# Patient Record
Sex: Male | Born: 1955 | Race: White | Hispanic: No | Marital: Married | Smoking: Former smoker
Health system: Southern US, Community
[De-identification: ages and names within clinical notes are randomized; demographics above are authoritative.]

## PROBLEM LIST (undated history)

## (undated) DIAGNOSIS — R109 Unspecified abdominal pain: Secondary | ICD-10-CM

## (undated) DIAGNOSIS — M1612 Unilateral primary osteoarthritis, left hip: Secondary | ICD-10-CM

## (undated) DIAGNOSIS — R10A2 Flank pain, left side: Secondary | ICD-10-CM

## (undated) DIAGNOSIS — N183 Chronic kidney disease, stage 3 unspecified: Secondary | ICD-10-CM

## (undated) DIAGNOSIS — Z973 Presence of spectacles and contact lenses: Secondary | ICD-10-CM

## (undated) DIAGNOSIS — Z972 Presence of dental prosthetic device (complete) (partial): Secondary | ICD-10-CM

## (undated) DIAGNOSIS — K219 Gastro-esophageal reflux disease without esophagitis: Secondary | ICD-10-CM

## (undated) DIAGNOSIS — E782 Mixed hyperlipidemia: Secondary | ICD-10-CM

## (undated) DIAGNOSIS — E119 Type 2 diabetes mellitus without complications: Secondary | ICD-10-CM

## (undated) DIAGNOSIS — M1990 Unspecified osteoarthritis, unspecified site: Secondary | ICD-10-CM

## (undated) DIAGNOSIS — I10 Essential (primary) hypertension: Secondary | ICD-10-CM

## (undated) DIAGNOSIS — Z87442 Personal history of urinary calculi: Secondary | ICD-10-CM

## (undated) HISTORY — PX: REPLACEMENT TOTAL KNEE: SUR1224

## (undated) HISTORY — PX: OTHER SURGICAL HISTORY: SHX169

## (undated) SURGERY — Surgical Case
Anesthesia: *Unknown

---

## 2002-01-14 ENCOUNTER — Ambulatory Visit (HOSPITAL_COMMUNITY): Admission: RE | Admit: 2002-01-14 | Discharge: 2002-01-14 | Payer: Self-pay | Admitting: *Deleted

## 2003-08-03 ENCOUNTER — Ambulatory Visit (HOSPITAL_BASED_OUTPATIENT_CLINIC_OR_DEPARTMENT_OTHER): Admission: RE | Admit: 2003-08-03 | Discharge: 2003-08-03 | Payer: Self-pay | Admitting: Orthopedic Surgery

## 2003-08-03 HISTORY — PX: ROTATOR CUFF REPAIR W/ DISTAL CLAVICLE EXCISION: SHX2365

## 2003-08-31 ENCOUNTER — Emergency Department (HOSPITAL_COMMUNITY): Admission: EM | Admit: 2003-08-31 | Discharge: 2003-08-31 | Payer: Self-pay | Admitting: Emergency Medicine

## 2004-01-25 ENCOUNTER — Emergency Department (HOSPITAL_COMMUNITY): Admission: EM | Admit: 2004-01-25 | Discharge: 2004-01-25 | Payer: Self-pay | Admitting: Emergency Medicine

## 2004-06-21 ENCOUNTER — Emergency Department (HOSPITAL_COMMUNITY): Admission: EM | Admit: 2004-06-21 | Discharge: 2004-06-22 | Payer: Self-pay | Admitting: *Deleted

## 2005-01-20 ENCOUNTER — Observation Stay (HOSPITAL_COMMUNITY): Admission: EM | Admit: 2005-01-20 | Discharge: 2005-01-21 | Payer: Self-pay | Admitting: Emergency Medicine

## 2006-01-31 ENCOUNTER — Emergency Department (HOSPITAL_COMMUNITY): Admission: EM | Admit: 2006-01-31 | Discharge: 2006-01-31 | Payer: Self-pay | Admitting: Emergency Medicine

## 2007-09-13 ENCOUNTER — Ambulatory Visit (HOSPITAL_COMMUNITY): Admission: RE | Admit: 2007-09-13 | Discharge: 2007-09-13 | Payer: Self-pay | Admitting: Chiropractic Medicine

## 2008-04-24 ENCOUNTER — Encounter: Admission: RE | Admit: 2008-04-24 | Discharge: 2008-06-20 | Payer: Self-pay | Admitting: Internal Medicine

## 2008-09-01 ENCOUNTER — Encounter: Admission: RE | Admit: 2008-09-01 | Discharge: 2008-09-01 | Payer: Self-pay | Admitting: Internal Medicine

## 2008-09-18 ENCOUNTER — Encounter: Admission: RE | Admit: 2008-09-18 | Discharge: 2008-09-18 | Payer: Self-pay

## 2008-10-06 ENCOUNTER — Encounter: Admission: RE | Admit: 2008-10-06 | Discharge: 2008-10-06 | Payer: Self-pay | Admitting: Internal Medicine

## 2009-05-25 ENCOUNTER — Encounter: Admission: RE | Admit: 2009-05-25 | Discharge: 2009-05-25 | Payer: Self-pay | Admitting: Internal Medicine

## 2009-08-27 ENCOUNTER — Encounter: Admission: RE | Admit: 2009-08-27 | Discharge: 2009-08-27 | Payer: Self-pay | Admitting: Internal Medicine

## 2010-03-29 ENCOUNTER — Encounter: Admission: RE | Admit: 2010-03-29 | Discharge: 2010-03-29 | Payer: Self-pay | Admitting: Internal Medicine

## 2010-04-11 ENCOUNTER — Encounter: Admission: RE | Admit: 2010-04-11 | Discharge: 2010-04-11 | Payer: Self-pay | Admitting: Internal Medicine

## 2010-05-13 ENCOUNTER — Encounter: Admission: RE | Admit: 2010-05-13 | Discharge: 2010-05-13 | Payer: Self-pay | Admitting: Internal Medicine

## 2010-05-28 ENCOUNTER — Encounter: Admission: RE | Admit: 2010-05-28 | Discharge: 2010-05-28 | Payer: Self-pay | Admitting: Orthopedic Surgery

## 2010-08-27 ENCOUNTER — Encounter
Admission: RE | Admit: 2010-08-27 | Discharge: 2010-08-27 | Payer: Self-pay | Admitting: Physical Medicine and Rehabilitation

## 2010-12-29 ENCOUNTER — Encounter: Payer: Self-pay | Admitting: Internal Medicine

## 2010-12-30 ENCOUNTER — Encounter: Payer: Self-pay | Admitting: Internal Medicine

## 2011-04-25 NOTE — H&P (Signed)
NAME:  Ralph Marquez, Ralph Marquez               ACCOUNT NO.:  [id number]   MEDICAL RECORD NO.:  [id number]          PATIENT TYPE:  EMS   LOCATION:  MAJO                         FACILITY:  [facility]   PHYSICIAN:  Elliot Cousin, M.D.    DATE OF BIRTH:  24-Jul-1956   DATE OF ADMISSION:  01/20/2005  DATE OF DISCHARGE:                                HISTORY & PHYSICAL   CHIEF COMPLAINT:  Chest pain.   HISTORY OF PRESENT ILLNESS:  The patient is a 55 year old man with a past  medical history significant for tobacco use, hemorrhoids, and rotator cuff  repair, who presents to the hospital with a chief complaint of chest pain.  The patient's chest pain started three days ago.  It occurred initially  while he was walking.  The chest pain is described as a twisting, dull pain.  It is located in the substernal area.  It occurred at rest and with  activity.  It lasts for a few seconds and then it resolved spontaneously.  He again had chest pain several times daily over the last three days.  He  states that the chest pain is lasting longer, seconds to minutes, and it  appears to be more constant.  At its worse, the chest pain is rated as a  5/10.  There is no associated diaphoresis, nausea, radiation, or pleurisy.  No light headedness.  However, the patient stated that he became slightly  disoriented today at work which prompted him to come to the emergency  department.  The patient denies being under more stress than usual.  He  denies anxiety.  He does manual labor as a Therapist, occupational.  He  does not lift a lot, however, he tugs and pulls on machinery all day long.  He has had no prior history of chest pain.  He has had no recent cough or  upper respiratory infection symptoms.  He has had no swollen legs.  He is  currently chest pain free now after being given one sublingual  nitroglycerin.  The patient does not recall having any recent heartburn or  indigestion.   PAST MEDICAL HISTORY:  1.   History of rectal bleeding in 2004 and 2005; colonoscopy performed in      either 2004 or 2005 by Dr. Tad Moore revealed hemorrhoids.  2.  Status post debridement of a rotator cuff tear and subacromial      decompensation August 2004 by Dr. Lajoyce Corners.  3.  Tobacco use.   MEDICATIONS:  None.   ALLERGIES:  No known drug allergies.   FAMILY HISTORY:  The patient's mother died at 39 years of age secondary to  kidney failure.  She had a history of heart disease, myocardial infarction,  COPD, and diabetes.  His father is deceased, age and etiology of death  unknown.  He has several siblings, none of which have heart disease.   SOCIAL HISTORY:  The patient is married.  He lives in [place], [place], with his wife.  He has three children.  He has seven  grandchildren.  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  He smokes one cigar per day.  He denies drug and alcohol use.   REVIEW OF SYMPTOMS:  The patient's review of systems is, otherwise,  negative.   PHYSICAL EXAMINATION:  VITAL SIGNS:  Temperature 97.3, blood pressure 154/86, repeated at 101/64,  pulse 91, repeated at 71, respiratory rate 20, oxygen saturation 96% on room  air.  GENERAL:  The patient is a pleasant, average built 55 year old Caucasian man  who is currently sitting up in bed in no acute distress.  HEENT:  Head is normocephalic, atraumatic.  Pupils equal, round, reactive to  light.  Extraocular movements intact.  Conjunctivae clear.  Sclerae white.  Tympanic membranes are clear bilaterally.  Nasal mucosa is moist, no  drainage, no sinus tenderness.  Oropharynx reveals upper dentures and native  teeth at the bottom.  No posterior exudates or erythema.  Mucous membranes  are mildly dry.  NECK:  Supple, no adenopathy, no thyromegaly, no JVD, no bruits.  LUNGS:  Clear to auscultation bilaterally.  HEART:  S1 and S2 without murmurs, gallops, and rubs.  ABDOMEN:  Positive bowel sounds, soft, mild  epigastric tenderness.  No  distention, no rebound, no guarding, no hepatosplenomegaly, no masses  palpated.  RECTAL AND GU:  Deferred.  EXTREMITIES:  Pedal pulses 2+ bilaterally.  No pedal edema.  No pretibial  edema.  No calf tenderness.  The patient had good range of motion of al his  joints.  No acute joint abnormality.  Gait is within normal limits.  NEUROLOGICAL:  The patient is alert and oriented x 3.  Cranial nerves 2-12  are intact.  Strength 5/5 throughout.  Sensation is intact throughout.   ADMISSION LABORATORY DATA:  Chest x-ray revealed no active disease.  EKG  showed normal sinus rhythm with a heart rate of 82 beats per minute, no  acute abnormalities.  Sodium 141, potassium 3.8, chloride 107, BUN 15,  glucose 99, bicarbonate 23, creatinine 1, myoglobin 68.1.  CK MB 1.1,  troponin I less than 0.05.   ASSESSMENT:  Chest pain.  Given the patient's risk factors including tobacco  abuse and  positive family history, he will be admitted to rule out  myocardial infarction.  The patient is currently chest pain free following  one sublingual nitroglycerin.  He does have a normal chest x-ray and a  normal EKG.   PLAN:  1.  The patient will be admitted for 24 hour observation to rule out      myocardial infarction.  2.  He will be admitted to a telemetry bed.  Cardiac enzymes q.8h. x 3 will      be ordered.  He will have further assessment via a TSH and fasting lipid      panel in the morning.  We will also order a complete CBC and CMP.  3.  Will treat the patient's pain with sublingual nitroglycerin as needed,      Oxycodone, and Tylenol.  4.  Will start Maalox 15 mL b.i.d. and Protonix 40 mg daily empirically.  5.  Will check EKG for chest pain x 2 and assess another EKG in the a.m.  6.  If the patient's EKG and/or cardiac enzymes become abnormal, will      consult cardiology.  7.  Will start an aspirin at 81 mg daily for now.     DF/MEDQ  D:  01/20/2005  T:  01/20/2005   Job:  [id number]   cc:   Juline Patch, M.D.  [address]  [place], [place] [address]  Fax: [phone]

## 2011-04-25 NOTE — Op Note (Signed)
NAME:  [name], Ralph                         ACCOUNT NO.:  [id number]   MEDICAL RECORD NO.:  [id number]                   PATIENT TYPE:  AMB   LOCATION:  DSC                                  FACILITY:  [facility]   PHYSICIAN:  Nadara Mustard, M.D.                DATE OF BIRTH:  07/21/1956   DATE OF PROCEDURE:  08/03/2003  DATE OF DISCHARGE:                                 OPERATIVE REPORT   PREOPERATIVE DIAGNOSES:  1. Rotator cuff tear.  2. Impingement syndrome.   POSTOPERATIVE DIAGNOSES:  1. Rotator cuff tear.  2. Grade 1 superior labrum anterior and posterior lesion.  3. Acromioclavicular joint arthropathy.  4. Impingement syndrome.   PROCEDURES:  1. Debridement of rotator cuff tear and superior labrum anterior and     posterior lesion.  2. Subacromial decompression.  3. Distal clavicle resection.   SURGEON:  Nadara Mustard, M.D.   ANESTHESIA:  General.   ESTIMATED BLOOD LOSS:  Minimal.   DISPOSITION:  To PACU in stable condition, discharged to home.  Plan to  begin range of motion in one day, follow up in the office in two weeks.   INDICATION FOR PROCEDURE:  The patient is a 55 year old gentleman with  chronic right shoulder pain.  The patient has failed conservative care, and  MRI scan confirms a rotator cuff tear and he presents at this time for  arthroscopic intervention.  The risks and benefits were discussed, including  infection, neurovascular injury, persistent pain, need for additional  surgery.  The patient states he understands and wishes to proceed at this  time.   DESCRIPTION OF PROCEDURE:  The patient was brought to OR room 5 and  underwent a general anesthetic.  After an adequate level of anesthesia  obtained, the patient's right upper extremity was prepped using Duraprep and  draped into a sterile field with the patient in the beach chair position.  The scope was inserted from the posterior portal and the anterior portal was  established from the  outside-in technique with an 18-gauge spinal.  Visualization showed a large grade 1 SLAP lesion as well as a large partial-  thickness tear of the rotator cuff just above the glenoid.  The rotator cuff  tear was debrided, the SLAP lesion was debrided, and the VAPR Mitek was used  for hemostasis.  The remainder of the biceps tendon was intact.  There was  no osteochondral lesion of the glenoid or the humeral head.  There were no  adhesions of the subscapularis.  The scope was then inserted from the  posterior portal into the subacromial space and a lateral portal was  established.  A subacromial decompression was performed.  The patient has a  large hooked type 3 acromion.  The VAPR Mitek was used for hemostasis.  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  Hemostasis was  obtained with the VAPR Mitek.  The instruments were removed.  The portals  were injected with a total of 20 mL of 0.5% Marcaine plain and 4 mg of  morphine.  The wounds were covered with Adaptic, orthopedic sponges, ABD  dressing, and Ioban dressing was applied.  The patient was extubated, taken  to the PACU in stable condition, discharged to home to follow up in the  office in two weeks, begin range of motion in one day.                                               Nadara Mustard, M.D.    MVD/MEDQ  D:  08/03/2003  T:  08/04/2003  Job:  [id number]

## 2013-05-09 ENCOUNTER — Other Ambulatory Visit: Payer: Self-pay | Admitting: Internal Medicine

## 2013-05-09 DIAGNOSIS — R7401 Elevation of levels of liver transaminase levels: Secondary | ICD-10-CM

## 2014-02-28 ENCOUNTER — Ambulatory Visit
Admission: RE | Admit: 2014-02-28 | Discharge: 2014-02-28 | Disposition: A | Discharge status: Home / Self Care | Payer: BC Managed Care – PPO | Source: Ambulatory Visit | Attending: Orthopedic Surgery | Admitting: Orthopedic Surgery

## 2014-02-28 ENCOUNTER — Other Ambulatory Visit: Payer: Self-pay | Admitting: Orthopedic Surgery

## 2014-02-28 DIAGNOSIS — M25561 Pain in right knee: Secondary | ICD-10-CM

## 2014-12-08 HISTORY — PX: TOTAL KNEE ARTHROPLASTY: SHX125

## 2015-02-14 ENCOUNTER — Ambulatory Visit (HOSPITAL_COMMUNITY): Payer: BLUE CROSS/BLUE SHIELD | Attending: Sports Medicine | Admitting: Physical Therapy

## 2015-02-14 DIAGNOSIS — M25661 Stiffness of right knee, not elsewhere classified: Secondary | ICD-10-CM | POA: Diagnosis not present

## 2015-02-14 DIAGNOSIS — Z96651 Presence of right artificial knee joint: Secondary | ICD-10-CM

## 2015-02-14 DIAGNOSIS — R29898 Other symptoms and signs involving the musculoskeletal system: Secondary | ICD-10-CM

## 2015-02-14 DIAGNOSIS — M6281 Muscle weakness (generalized): Secondary | ICD-10-CM | POA: Insufficient documentation

## 2015-02-14 DIAGNOSIS — Z471 Aftercare following joint replacement surgery: Secondary | ICD-10-CM | POA: Diagnosis not present

## 2015-02-14 DIAGNOSIS — R262 Difficulty in walking, not elsewhere classified: Secondary | ICD-10-CM

## 2015-02-14 DIAGNOSIS — M25561 Pain in right knee: Secondary | ICD-10-CM | POA: Insufficient documentation

## 2015-02-14 NOTE — Therapy (Signed)
[facility] [address] [place], [place], [address] Phone: [phone]   Fax:  [phone]  Physical Therapy Evaluation  Patient Details  Name: Ralph Marquez MRN: [id number] Date of Birth: [birth year] Referring Provider:  Lenis Dickinson., MD  Encounter Date: 02/14/2015      PT End of Session - 02/14/15 1759    Visit Number 1   Number of Visits 16   Date for PT Re-Evaluation 03/16/15   Authorization Type BCBS   Authorization - Visit Number 1   Authorization - Number of Visits 16   PT Start Time 1700   PT Stop Time 1754   PT Time Calculation (min) 54 min   Activity Tolerance Patient tolerated treatment well   Behavior During Therapy [facility] for tasks assessed/performed      No past medical history on file.  No past surgical history on file.  There were no vitals taken for this visit.  Visit Diagnosis:  Knee stiffness, right - Plan: PT plan of care cert/re-cert  Difficulty walking - Plan: PT plan of care cert/re-cert  Right leg weakness - Plan: PT plan of care cert/re-cert  Status post total right knee replacement - Plan: PT plan of care cert/re-cert      Subjective Assessment - 02/14/15 1727    Symptoms Patyinet notes some numbness and tingling and weakness in Rt hand s/p right TKA due to over compensation with weight bearing through Rt UE.    Pertinent History histor of Lt knee meniscus tear that uimproved following surgery. Rt knee Replacement due to failed meniscus repair on it. Patient had surgery 01/23/15. 2 weeks of HHPt which resulted in a good HEP and improving strength. Patient ambulates wiht a cain everywhere except for very short distances in home. Initial injury occureded durign a fall due to tripping in a hole.    Limitations Walking   How long can you walk comfortably?   Patient Stated Goals to be able to get up off the floor, to be bale to walk wihout a cain, and to be able to hike. 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 Yes   Pain Score 2    Pain Location Knee   Pain Orientation Right   Pain Descriptors / Indicators Dull   Pain Type Surgical pain   Pain Onset 1 to 4 weeks ago   Pain Frequency Intermittent   Aggravating Factors  pain and weight bearing.   Pain Relieving Factors pain medication, elevation, stretching.          [facility] PT Assessment - 02/14/15 0001    Assessment   Medical Diagnosis Rt TKA   Onset Date 01/23/15   Next MD Visit [name], 03/11/15   Balance Screen   Has the patient fallen in the past 6 months No   Has the patient had a decrease in activity level because of a fear of falling?  No   Prior Function   Level of Independence Independent with basic ADLs   Functional Tests   Functional tests Other2;Other   Other:   Other/ Comments ait: limited hip internal rotation, excessive toe out, limited weight bearing on Rt.    ROM / Strength   AROM / PROM / Strength AROM;Strength   AROM   AROM Assessment Site Hip;Knee;Ankle   Right/Left Hip Right;Left   Right Hip External Rotation  45   Right Hip Internal Rotation  35  Left Hip External Rotation  45   Left Hip Internal Rotation  34   Right/Left Knee Right;Left   Right Knee Extension -5   Right Knee Flexion 94   Left Knee Extension 0   Left Knee Flexion 130   Strength   Strength Assessment Site Hip;Knee;Ankle   Right/Left Hip Right;Left   Right Hip Flexion 5/5   Left Hip Flexion 5/5   Right/Left Knee Right;Left   Right Knee Flexion 4/5   Right Knee Extension 4-/5   Left Knee Flexion 5/5   Left Knee Extension 5/5   Right/Left Ankle Right;Left                  [facility] Adult PT Treatment/Exercise - 02/14/15 0001    Knee/Hip Exercises: Stretches   Active Hamstring Stretch 3 reps;20 seconds   Active Hamstring Stretch Limitations 20 seconds   Quad Stretch Limitations 3x 20 seconds with rope   Hip Flexor Stretch Limitations 3x 20 seconds 3 wayto 6" box   Gastroc  Stretch Limitations 3x 20seconds on floor.                  PT Short Term Goals - 02/14/15 1804    PT SHORT TERM GOAL #1   Title Patient will dmeonstrate full knee extension to 0 degrees t be able to fully extend knee to normalize gait mechanics.    Time 4   Period Weeks   Status New   PT SHORT TERM GOAL #2   Title Patient will be able to flex knee to 110 degrees to squat to a chair   Time 4   Period Weeks   Status New   PT SHORT TERM GOAL #3   Title Patient will be able to walk withtou a cain for without fear of falling.    Time 4   Period Weeks   Status New   PT SHORT TERM GOAL #4   Title Patiwent will be independent with HEP.   Time 4   Period Weeks   Status New           PT Long Term Goals - 02/14/15 1806    PT LONG TERM GOAL #1   Title Patient will be able to flex knee to 120 degrees to squat to the floor   Time 8   Period Weeks   Status New   PT LONG TERM GOAL #2   Title Patient will be able to walk without a cain for wit pain <2/10 on average.    Time 8   Period Weeks   Status New   PT LONG TERM GOAL #3   Title Patient will demosntrate 5/5/ Rt knee flexion/extension strength with ability to ambulate up and down stairs withtou UE support.    Time 8   Period Weeks   Status New               Plan - 02/14/15 1800    Clinical Impression Statement Patient displays Rt knee stiffness and pain secondary to weakness s/p Rt TKA resulting in difficutly walking and patient una ble to return to work. Patient will benefit from skileld phsyical therapy toincrease Rt knee strength and ROM so patient can walk without an assistive device, crawl on hands and knees to return to work, knee on the ground and be able to hike to keep up with grand daughters.    Pt will benefit from skilled therapeutic intervention in order to improve on the following  deficits Decreased strength;Pain;Abnormal gait;Difficulty walking;Decreased activity  tolerance;Impaired flexibility;Decreased endurance;Improper body mechanics;Decreased range of motion   Rehab Potential Good   PT Frequency 2x / week   PT Duration 8 weeks   PT Treatment/Interventions Therapeutic exercise;Patient/family education;Gait training;Manual techniques;Stair training   PT Next Visit Plan Review LE strretches, introduce 3D hip excursions, 3 way knee driver and squat walk arounds for increasing hip and knee mobility    PT Home Exercise Plan hamstring, hip flexor, quadriceps, and calf stretches. 20xeconds, multi directional.    Consulted and Agree with Plan of Care Patient         Problem List There are no active problems to display for this patient.  Jerilee FieldCash Itzel Mckibbin PT DPT [phone]  [facility] [address] [place], [place], [address] Phone: [phone]   Fax:  [phone]

## 2015-02-15 ENCOUNTER — Ambulatory Visit (HOSPITAL_COMMUNITY): Payer: BLUE CROSS/BLUE SHIELD | Admitting: Physical Therapy

## 2015-02-15 DIAGNOSIS — Z471 Aftercare following joint replacement surgery: Secondary | ICD-10-CM | POA: Diagnosis not present

## 2015-02-15 DIAGNOSIS — Z96651 Presence of right artificial knee joint: Secondary | ICD-10-CM

## 2015-02-15 DIAGNOSIS — M25661 Stiffness of right knee, not elsewhere classified: Secondary | ICD-10-CM

## 2015-02-15 DIAGNOSIS — R29898 Other symptoms and signs involving the musculoskeletal system: Secondary | ICD-10-CM

## 2015-02-15 DIAGNOSIS — R262 Difficulty in walking, not elsewhere classified: Secondary | ICD-10-CM

## 2015-02-15 NOTE — Therapy (Signed)
[facility] [address] [place], [place], [address] Phone: [phone]   Fax:  [phone]  Physical Therapy Treatment  Patient Details  Name: Ralph Marquez MRN: [id number] Date of Birth: 10/04/1956 Referring Provider:  Lenis DickinsonWeller, Edward B., MD  Encounter Date: 02/15/2015      PT End of Session - 02/15/15 1600    Visit Number 2   Number of Visits 16   Date for PT Re-Evaluation 03/16/15   Authorization Type BCBS   Authorization - Visit Number 2   Authorization - Number of Visits 16   PT Start Time 1515   PT Stop Time 1557   PT Time Calculation (min) 42 min   Activity Tolerance Patient tolerated treatment well   Behavior During Therapy [facility] for tasks assessed/performed      No past medical history on file.  No past surgical history on file.  There were no vitals filed for this visit.  Visit Diagnosis:  Knee stiffness, right  Difficulty walking  Right leg weakness  Status post total right knee replacement      Subjective Assessment - 02/15/15 1535    Symptoms Patient states that he is having 2/10 pain; just doing "alright" today. States that he feels like he'll start to do better once more of the swelling gets out of his leg.    Pertinent History histor of Lt knee meniscus tear that uimproved following surgery. Rt knee Replacement due to failed meniscus repair on it. Patient had surgery 01/23/15. 2 weeks of HHPt which resulted in a good HEP and improving strength. Patient ambulates wiht a cain everywhere except for very short distances in home. Initial injury occureded durign a fall due to tripping in a hole.                        [facility] Adult PT Treatment/Exercise - 02/15/15 0001    Knee/Hip Exercises: Stretches   Active Hamstring Stretch 3 reps;30 seconds   Active Hamstring Stretch Limitations 14 inch box   Quad Stretch 2 reps;30 seconds   Quad Stretch Limitations prone with rope   Piriformis Stretch 2  reps;30 seconds   Piriformis Stretch Limitations supine   Gastroc Stretch 3 reps;30 seconds   Gastroc Stretch Limitations slantboard   Knee/Hip Exercises: Standing   Forward Lunges 1 set;Both;10 reps   Forward Lunges Limitations 6 inch box, forward knee drives   Functional Squat 1 set;10 reps   Functional Squat Limitations sit to stand from standard chair   Rocker Board 4 minutes   Rocker Board Limitations 2 minutes anterior/posterior, 2 minutes lateral   Gait Training Stair training on 7" steps x4 steps four times with cane, U railing; step over step pattern with mild circumduction R leg.    Other Standing Knee Exercises Facilitation of bilateral hip IR   Knee/Hip Exercises: Supine   Bridges Both;1 set;10 reps   Knee/Hip Exercises: Sidelying   Hip ABduction Both;1 set;10 reps   Hip ABduction Limitations manual facilitation for performance of task   Knee/Hip Exercises: Prone   Hip Extension Both;1 set;10 reps   Hip Extension Limitations manual faciliation for formj             Balance Exercises - 02/15/15 1558    Balance Exercises: Standing   Standing Eyes Closed Narrow base of support (BOS);Foam/compliant surface;3 reps;20 secs   Tandem Stance Eyes open;Foam/compliant surface;3 reps;20 secs  PT Education - 02/15/15 1559    Education provided Yes   Education Details Education regarding role of motion in reduced edema through surgical leg   Person(s) Educated Patient   Methods Explanation   Comprehension Verbalized understanding          PT Short Term Goals - 02/14/15 1804    PT SHORT TERM GOAL #1   Title Patient will dmeonstrate full knee extension to 0 degrees t be able to fully extend knee to normalize gait mechanics.    Time 4   Period Weeks   Status New   PT SHORT TERM GOAL #2   Title Patient will be able to flex knee to 110 degrees to squat to a chair   Time 4   Period Weeks   Status New   PT SHORT TERM GOAL #3   Title Patient will be able  to walk withtou a cain for without fear of falling.    Time 4   Period Weeks   Status New   PT SHORT TERM GOAL #4   Title Patiwent will be independent with HEP.   Time 4   Period Weeks   Status New           PT Long Term Goals - 02/14/15 1806    PT LONG TERM GOAL #1   Title Patient will be able to flex knee to 120 degrees to squat to the floor   Time 8   Period Weeks   Status New   PT LONG TERM GOAL #2   Title Patient will be able to walk without a cain for wit pain <2/10 on average.    Time 8   Period Weeks   Status New   PT LONG TERM GOAL #3   Title Patient will demosntrate 5/5/ Rt knee flexion/extension strength with ability to ambulate up and down stairs withtou UE support.    Time 8   Period Weeks   Status New               Plan - 02/15/15 1601    Clinical Impression Statement Patient continues to display R knee stiffness, pain, and difficulty with ambulation and functional task performance. Patient responded well to today's exercise program but did display weakness of proximal muscle groups as well as overall difficulty with mobility. Signfiicant bruising noted throughout R leg, also significant edema however patient states that this has improved significantly since his surgery.    Pt will benefit from skilled therapeutic intervention in order to improve on the following deficits Decreased strength;Pain;Abnormal gait;Difficulty walking;Decreased activity tolerance;Impaired flexibility;Decreased endurance;Improper body mechanics;Decreased range of motion   Rehab Potential Good   PT Frequency 2x / week   PT Duration 8 weeks   PT Treatment/Interventions Therapeutic exercise;Patient/family education;Gait training;Manual techniques;Stair training   PT Next Visit Plan Continue functional stretches and strengthening; address hip IR limitations; stair and gait training   Consulted and Agree with Plan of Care Patient        Problem List There  are no active problems to display for this patient.  Nedra Hai PT, DPT [phone]  [facility] [address] [place], [place], [address] Phone: [phone]   Fax:  [phone]

## 2015-02-16 ENCOUNTER — Ambulatory Visit (HOSPITAL_COMMUNITY): Payer: BLUE CROSS/BLUE SHIELD | Admitting: Physical Therapy

## 2015-02-16 DIAGNOSIS — Z96651 Presence of right artificial knee joint: Secondary | ICD-10-CM

## 2015-02-16 DIAGNOSIS — M25661 Stiffness of right knee, not elsewhere classified: Secondary | ICD-10-CM

## 2015-02-16 DIAGNOSIS — R29898 Other symptoms and signs involving the musculoskeletal system: Secondary | ICD-10-CM

## 2015-02-16 DIAGNOSIS — R262 Difficulty in walking, not elsewhere classified: Secondary | ICD-10-CM

## 2015-02-16 DIAGNOSIS — Z471 Aftercare following joint replacement surgery: Secondary | ICD-10-CM | POA: Diagnosis not present

## 2015-02-16 NOTE — Therapy (Signed)
[facility] [address] [place], [place], [address] Phone: [phone]   Fax:  [phone]  Physical Therapy Treatment  Patient Details  Name: Ralph Marquez MRN: [id number] Date of Birth: 03/08/1956 Referring Provider:  Lenis DickinsonWeller, Edward B., MD  Encounter Date: 02/16/2015      PT End of Session - 02/16/15 1313    Visit Number 3   Number of Visits 16   Date for PT Re-Evaluation 03/16/15   Authorization Type BCBS   Authorization - Visit Number 3   Authorization - Number of Visits 16   PT Start Time 1300   PT Stop Time 1345   PT Time Calculation (min) 45 min   Activity Tolerance Patient tolerated treatment well   Behavior During Therapy [facility] for tasks assessed/performed      No past medical history on file.  No past surgical history on file.  There were no vitals filed for this visit.  Visit Diagnosis:  Knee stiffness, right  Difficulty walking  Right leg weakness  Status post total right knee replacement      Subjective Assessment - 02/16/15 1303    Symptoms Patient is in a good mood notes mild soreness and that his knee is overall feeing good.    Currently in Pain? No/denies   Pain Score 2    Pain Location Knee            [facility] - 02/16/15 0001    Assessment   Next MD Visit Loistine ChanceEdward Weller, 03/01/15                   [facility] Adult PT Treatment/Exercise - 02/16/15 0001    Knee/Hip Exercises: Stretches   Active Hamstring Stretch 3 reps;30 seconds   Active Hamstring Stretch Limitations 8 inch box   Quad Stretch 2 reps;30 seconds   Quad Stretch Limitations prone with rope   Hip Flexor Stretch Limitations 3x 20 seconds 3 wayto 6" box 10x 3 seconds 3 way    Piriformis Stretch 2 reps;30 seconds   Piriformis Stretch Limitations supine   Gastroc Stretch 3 reps;30 seconds   Gastroc Stretch Limitations slantboard   Knee/Hip Exercises: Standing   Forward Lunges 5 reps   Forward Lunges Limitations  floor static   Side Lunges 5 reps   Side Lunges Limitations floor static   Functional Squat Limitations squa walk around 10x to 6" box   Rocker Board 4 minutes   Rocker Board Limitations 2 minutes anterior/posterior, 2 minutes lateral   Other Standing Knee Exercises 3D hip excursion 10x    Other Standing Knee Exercises anterior lateral lunges to 6" 10x with opposite side rotation             Balance Exercises - 02/15/15 1558    Balance Exercises: Standing   Standing Eyes Closed Narrow base of support (BOS);Foam/compliant surface;3 reps;20 secs   Tandem Stance Eyes open;Foam/compliant surface;3 reps;20 secs           PT Education - 02/15/15 1559    Education provided Yes   Education Details Education regarding role of motion in reduced edema through surgical leg   Person(s) Educated Patient   Methods Explanation   Comprehension Verbalized understanding          PT Short Term Goals - 02/14/15 1804    PT SHORT TERM GOAL #1   Title Patient will dmeonstrate full knee extension to 0 degrees t be able to fully extend knee to normalize  gait mechanics.    Time 4   Period Weeks   Status New   PT SHORT TERM GOAL #2   Title Patient will be able to flex knee to 110 degrees to squat to a chair   Time 4   Period Weeks   Status New   PT SHORT TERM GOAL #3   Title Patient will be able to walk withtou a cain for without fear of falling.    Time 4   Period Weeks   Status New   PT SHORT TERM GOAL #4   Title Patiwent will be independent with HEP.   Time 4   Period Weeks   Status New           PT Long Term Goals - 02/14/15 1806    PT LONG TERM GOAL #1   Title Patient will be able to flex knee to 120 degrees to squat to the floor   Time 8   Period Weeks   Status New   PT LONG TERM GOAL #2   Title Patient will be able to walk without a cain for wit pain <2/10 on average.    Time 8   Period Weeks   Status New   PT LONG TERM GOAL #3   Title  Patient will demosntrate 5/5/ Rt knee flexion/extension strength with ability to ambulate up and down stairs withtou UE support.    Time 8   Period Weeks   Status New               Plan - 02/16/15 1313    Clinical Impression Statement Patient displays improving knee and hip ROM resulting in improving gait with decreased reliance of cain. Patient displasy improvign activity toelrance as well. Stretches performed this session to good effect. Patient displayed difficulty maintaining Streight knee durign 14" box hamstring stretch which was then regressed to 8" with good form.  paion with hooklaying pirifomirs stretch whcih was withheld.    PT Next Visit Plan Continue functional stretches and strengthening; address hip IR limitations; perform gait activities to normalize stride.         Problem List There are no active problems to display for this patient.   Doyne Keel 02/16/2015, 1:41 PM  [facility] [address] [place], [place], [address] Phone: [phone]   Fax:  [phone]

## 2015-02-19 ENCOUNTER — Ambulatory Visit (HOSPITAL_COMMUNITY): Payer: BLUE CROSS/BLUE SHIELD | Admitting: Physical Therapy

## 2015-02-19 DIAGNOSIS — Z471 Aftercare following joint replacement surgery: Secondary | ICD-10-CM | POA: Diagnosis not present

## 2015-02-19 DIAGNOSIS — M25661 Stiffness of right knee, not elsewhere classified: Secondary | ICD-10-CM

## 2015-02-19 DIAGNOSIS — Z96651 Presence of right artificial knee joint: Secondary | ICD-10-CM

## 2015-02-19 DIAGNOSIS — R262 Difficulty in walking, not elsewhere classified: Secondary | ICD-10-CM

## 2015-02-19 DIAGNOSIS — R29898 Other symptoms and signs involving the musculoskeletal system: Secondary | ICD-10-CM

## 2015-02-19 NOTE — Therapy (Signed)
[facility] [address] [place], [place], [address] Phone: [phone]   Fax:  [phone]  Physical Therapy Treatment  Patient Details  Name: Ralph Marquez MRN: [id number] Date of Birth: 10/10/1956 Referring Provider:  Lenis DickinsonWeller, Edward B., MD  Encounter Date: 02/19/2015      PT End of Session - 02/19/15 1154    Visit Number 4   Number of Visits 16   Date for PT Re-Evaluation 03/16/15   Authorization Type BCBS   Authorization - Visit Number 4   Authorization - Number of Visits 16   PT Start Time 1101   PT Stop Time 1149   PT Time Calculation (min) 48 min   Activity Tolerance Patient tolerated treatment well   Behavior During Therapy [facility] for tasks assessed/performed      No past medical history on file.  No past surgical history on file.  There were no vitals filed for this visit.  Visit Diagnosis:  Knee stiffness, right  Difficulty walking  Right leg weakness  Status post total right knee replacement       [facility] PT Assessment - 02/19/15 0001    Assessment   Medical Diagnosis Rt TKA   Onset Date 01/23/15   Next MD Visit Loistine ChanceEdward Weller, 03/01/15   AROM   Right Knee Extension -5   Right Knee Flexion 105           [facility] Adult PT Treatment/Exercise - 02/19/15 0001    Knee/Hip Exercises: Stretches   Active Hamstring Stretch 3 reps;30 seconds   Active Hamstring Stretch Limitations 8 inch box   Quad Stretch 2 reps;30 seconds   Quad Stretch Limitations prone with rope   Hip Flexor Stretch Limitations 3x 20 seconds 3 wayto 6" box 10x 3 seconds 3 way    Piriformis Stretch 2 reps;30 seconds   Piriformis Stretch Limitations supine   Gastroc Stretch 3 reps;30 seconds   Gastroc Stretch Limitations slantboard   Knee/Hip Exercises: Aerobic   Stationary Bike 6 min a begining and end of session for ROM.    Knee/Hip Exercises: Standing   Heel Raises 2 sets;10 reps   Heel Raises Limitations heel toe raises.    Forward  Lunges 3 sets;10 reps   Forward Lunges Limitations to 4" box with same side rotation, opposite side rotation and knee high reach.    Other Standing Knee Exercises 3D hip excursion splt stance10x  sagittal plane neutral.                   PT Short Term Goals - 02/14/15 1804    PT SHORT TERM GOAL #1   Title Patient will dmeonstrate full knee extension to 0 degrees t be able to fully extend knee to normalize gait mechanics.    Time 4   Period Weeks   Status New   PT SHORT TERM GOAL #2   Title Patient will be able to flex knee to 110 degrees to squat to a chair   Time 4   Period Weeks   Status New   PT SHORT TERM GOAL #3   Title Patient will be able to walk withtou a cain for 30minutes without fear of falling.    Time 4   Period Weeks   Status New   PT SHORT TERM GOAL #4   Title Patiwent will be independent with HEP.   Time 4   Period Weeks   Status New  PT Long Term Goals - 02/14/15 1806    PT LONG TERM GOAL #1   Title Patient will be able to flex knee to 120 degrees to squat to the floor   Time 8   Period Weeks   Status New   PT LONG TERM GOAL #2   Title Patient will be able to walk without a cain for wit pain <2/10 on average.    Time 8   Period Weeks   Status New   PT LONG TERM GOAL #3   Title Patient will demosntrate 5/5/ Rt knee flexion/extension strength with ability to ambulate up and down stairs withtou UE support.    Time 8   Period Weeks   Status New               Plan - 02/19/15 1155    Clinical Impression Statement Patient tolerated treatment well displaying improved knee ROM and improved ability to load Rt knee as demonstrated during static lunges. Patient continues to benefit from slow progression wiht focus shifting now to increasing fukll knee extension to regain remaingin 5 degrees from neutral.    PT Next Visit Plan FOcus on increasing knee extension with a secondary focus on knee flexion ROM and strength.          Problem List There are no active problems to display for this patient.   Doyne Keel 02/19/2015, 11:59 AM  [facility] [address] [place], [place], [address] Phone: [phone]   Fax:  [phone]

## 2015-02-21 ENCOUNTER — Ambulatory Visit (HOSPITAL_COMMUNITY): Payer: BLUE CROSS/BLUE SHIELD

## 2015-02-21 DIAGNOSIS — M25661 Stiffness of right knee, not elsewhere classified: Secondary | ICD-10-CM

## 2015-02-21 DIAGNOSIS — R262 Difficulty in walking, not elsewhere classified: Secondary | ICD-10-CM

## 2015-02-21 DIAGNOSIS — R29898 Other symptoms and signs involving the musculoskeletal system: Secondary | ICD-10-CM

## 2015-02-21 DIAGNOSIS — Z96651 Presence of right artificial knee joint: Secondary | ICD-10-CM

## 2015-02-21 DIAGNOSIS — Z471 Aftercare following joint replacement surgery: Secondary | ICD-10-CM | POA: Diagnosis not present

## 2015-02-21 NOTE — Therapy (Signed)
Ralph Marquez [place], [name], [address] Phone: [phone]   Fax:  [phone]  Physical Therapy Treatment  Patient Details  Name: Ralph Marquez MRN: [id number] Date of Birth: 1956/05/17 Referring Provider:  Ellamae Sia., MD  Encounter Date: 02/21/2015      PT End of Session - 02/21/15 1201    Visit Number 5   Number of Visits 16   Date for PT Re-Evaluation 03/16/15   Authorization Type BCBS   Authorization - Visit Number 5   Authorization - Number of Visits 16   PT Start Time 6962   PT Stop Time 1236   PT Time Calculation (min) 41 min   Activity Tolerance Patient tolerated treatment well   Behavior During Therapy [facility] for tasks assessed/performed      No past medical history on file.  No past surgical history on file.  There were no vitals filed for this visit.  Visit Diagnosis:  Knee stiffness, right  Difficulty walking  Right leg weakness  Status post total right knee replacement      Subjective Assessment - 02/21/15 1200    Symptoms Pt stated knee is sore and stiff today, no reports of pain today.  Pt has been working on the ROM at home.  Reported busy day yesteday mowed grass and tasks around the house.   Currently in Pain? No/denies            [facility] PT Assessment - 02/21/15 0001    Assessment   Medical Diagnosis Rt TKA   Onset Date 01/23/15   Next MD Visit Ralph Marquez, 03/01/15   AROM   Right Knee Extension -4   Right Knee Flexion 113                   [facility] Adult PT Treatment/Exercise - 02/21/15 0001    Exercises   Exercises Knee/Hip   Knee/Hip Exercises: Stretches   Active Hamstring Stretch 3 reps;30 seconds   Active Hamstring Stretch Limitations 14 inch box   Quad Stretch 3 reps;30 seconds   Quad Stretch Limitations prone with rope   Knee: Self-Stretch Limitations 10 knee drives on 8in step 3 directions   Piriformis Stretch 3 reps;30 seconds   Piriformis Stretch  Limitations supine figure 4   Gastroc Stretch 3 reps;30 seconds   Gastroc Stretch Limitations slantboard   Knee/Hip Exercises: Aerobic   Stationary Bike 8 min initial session for ROM seat 10-->9   Knee/Hip Exercises: Standing   Heel Raises 1 set   Heel Raises Limitations heel walking for knee extension   Other Standing Knee Exercises 3D hip excursion splt stance10x  sagittal plane neutral.    Knee/Hip Exercises: Supine   Quad Sets 10 reps   Quad Sets Limitations 4-113 degrees   Short Arc Quad Sets 15 reps                  PT Short Term Goals - 02/21/15 1216    PT SHORT TERM GOAL #1   Title Patient will dmeonstrate full knee extension to 0 degrees t be able to fully extend knee to normalize gait mechanics.    Baseline 02/21/2015 -4-113   Status On-going   PT SHORT TERM GOAL #2   Title Patient will be able to flex knee to 110 degrees to squat to a chair   Baseline 02/21/2015 -4-113   Status Partially Met   PT SHORT TERM GOAL #3   Title Patient will  be able to walk withtou a cain for 59mnutes without fear of falling.    Baseline Ambulates no AD, able to walk around [employer] for an hour   Status Achieved   PT SHORT TERM GOAL #4   Title Patiwent will be independent with HEP.   Baseline 3x Daily   Status Achieved           PT Long Term Goals - 02/21/15 1217    PT LONG TERM GOAL #1   Title Patient will be able to flex knee to 120 degrees to squat to the floor   Status On-going   PT LONG TERM GOAL #2   Title Patient will be able to walk without a cain for 359mutes wit pain <2/10 on average.    Status On-going   PT LONG TERM GOAL #3   Title Patient will demosntrate 5/5/ Rt knee flexion/extension strength with ability to ambulate up and down stairs withtou UE support.    Status On-going               Plan - 02/21/15 1402    Clinical Impression Statement Session focus on improving AROM with vast improvements with extension and flexion.  Began heel  walking with focus on knee extension and to improve balance for stability.  Resumed knee drives to improve knee flexion with vast improvements with AROM lacking 4 degrees to extension and 113 for flexion.  Following discussion pt has met 2 STG and partially met 1 STG, pt compliant with HEP 3x daily, able to ambulate with no AD for up to an hour and AROM 113 flexion, still difficulty with sit to stand from low chair.   PT Next Visit Plan FOcus on increasing knee extension with a secondary focus on knee flexion ROM and strength.         Problem List There are no active problems to display for this patient.  CaIhor AustinLPCamptownCoAldona Lento/16/2016, 2:10 PM  [place]: [phone] Fax:  [phone]

## 2015-02-23 ENCOUNTER — Ambulatory Visit (HOSPITAL_COMMUNITY): Payer: BLUE CROSS/BLUE SHIELD

## 2015-02-23 DIAGNOSIS — R262 Difficulty in walking, not elsewhere classified: Secondary | ICD-10-CM

## 2015-02-23 DIAGNOSIS — R29898 Other symptoms and signs involving the musculoskeletal system: Secondary | ICD-10-CM

## 2015-02-23 DIAGNOSIS — Z96651 Presence of right artificial knee joint: Secondary | ICD-10-CM

## 2015-02-23 DIAGNOSIS — M25661 Stiffness of right knee, not elsewhere classified: Secondary | ICD-10-CM

## 2015-02-23 DIAGNOSIS — Z471 Aftercare following joint replacement surgery: Secondary | ICD-10-CM | POA: Diagnosis not present

## 2015-02-23 NOTE — Therapy (Signed)
[facility] [address] [place], [place], [address] Phone: [phone]   Fax:  [phone]  Physical Therapy Treatment  Patient Details  Name: Ralph Marquez MRN: [id number] Date of Birth: [birth year] Referring Provider:  Lenis Dickinson., MD  Encounter Date: 02/23/2015      PT End of Session - 02/23/15 1335    Visit Number 6   Number of Visits 16   Date for PT Re-Evaluation 03/16/15   Authorization Type BCBS   Authorization - Visit Number 6   Authorization - Number of Visits 16   PT Start Time 1150   PT Stop Time 1238   PT Time Calculation (min) 48 min   Activity Tolerance Patient tolerated treatment well   Behavior During Therapy [facility] for tasks assessed/performed      No past medical history on file.  No past surgical history on file.  There were no vitals filed for this visit.  Visit Diagnosis:  Knee stiffness, right  Difficulty walking  Right leg weakness  Status post total right knee replacement      Subjective Assessment - 02/23/15 1152    Symptoms Feeling good today, took pain meds prior therapy today.  Reports has been working on ROM at home and able to sleep for longer though still in recliner because he doesn't want to wake wife up.     Currently in Pain? No/denies            [facility] PT Assessment - 02/23/15 0001    Assessment   Medical Diagnosis Rt TKA   Onset Date 01/23/15   Next MD Visit [name], 03/01/15                [facility] Adult PT Treatment/Exercise - 02/23/15 0001    Exercises   Exercises Knee/Hip   Knee/Hip Exercises: Stretches   Active Hamstring Stretch 3 reps;30 seconds   Active Hamstring Stretch Limitations 14 inch box   Quad Stretch 3 reps;30 seconds   Quad Stretch Limitations prone with rope   Knee: Self-Stretch Limitations 10 knee drives on 8in step 3 directions   Piriformis Stretch 3 reps;30 seconds   Piriformis Stretch Limitations seated   Gastroc Stretch 3 reps;30  seconds   Gastroc Stretch Limitations slantboard   Knee/Hip Exercises: Aerobic   Stationary Bike 8 min initial session for ROM seat 9-->7   Knee/Hip Exercises: Standing   Terminal Knee Extension Right;10 reps;Theraband   Theraband Level (Terminal Knee Extension) Level 3 (Green)   Functional Squat Limitations squa walk around 10x to 6" box   Stairs 3RT 7in asecnd and descend 1 HHA   Other Standing Knee Exercises 3D hip excursion splt stance10x  sagittal plane neutral.    Knee/Hip Exercises: Supine   Quad Sets 10 reps   Short Arc Quad Sets 15 reps   Heel Slides 5 reps   Heel Slides Limitations 3-118                  PT Short Term Goals - 02/23/15 1349    PT SHORT TERM GOAL #1   Title Patient will dmeonstrate full knee extension to 0 degrees t be able to fully extend knee to normalize gait mechanics.    Status On-going   PT SHORT TERM GOAL #2   Title Patient will be able to flex knee to 110 degrees to squat to a chair   Status Achieved   PT SHORT TERM GOAL #3   Title Patient will  be able to walk withtou a cain for 30minutes without fear of falling.    Status Achieved   PT SHORT TERM GOAL #4   Title Patiwent will be independent with HEP.   Status Achieved           PT Long Term Goals - 02/23/15 1350    PT LONG TERM GOAL #1   Title Patient will be able to flex knee to 120 degrees to squat to the floor   Status On-going   PT LONG TERM GOAL #2   Title Patient will be able to walk without a cain for 30minutes wit pain <2/10 on average.    Status On-going   PT LONG TERM GOAL #3   Title Patient will demosntrate 5/5/ Rt knee flexion/extension strength with ability to ambulate up and down stairs withtou UE support.    Status On-going               Plan - 02/23/15 1337    Clinical Impression Statement Session focus on improving AROM primarly extension then flexion.  Added standing TKE to improve quad strengthening for knee extesnion.  AROM following exercises  and stretches improved to 3-118 degrees.   PT Next Visit Plan FOcus on increasing knee extension with a secondary focus on knee flexion ROM and strength.         Problem List There are no active problems to display for this patient.  Becky Saxasey Germaine Shenker, ArizonaLPTA [phone]  Juel BurrowCockerham, Margaurite Salido Jo 02/23/2015, 1:51 PM  [facility] [address] [place], [place], [address] Phone: [phone]   Fax:  [phone]

## 2015-02-26 ENCOUNTER — Ambulatory Visit (HOSPITAL_COMMUNITY): Payer: BLUE CROSS/BLUE SHIELD | Admitting: Physical Therapy

## 2015-02-27 ENCOUNTER — Telehealth (HOSPITAL_COMMUNITY): Payer: Self-pay

## 2015-02-27 ENCOUNTER — Ambulatory Visit (HOSPITAL_COMMUNITY): Payer: BLUE CROSS/BLUE SHIELD | Admitting: Physical Therapy

## 2015-02-27 DIAGNOSIS — M25661 Stiffness of right knee, not elsewhere classified: Secondary | ICD-10-CM

## 2015-02-27 DIAGNOSIS — Z96651 Presence of right artificial knee joint: Secondary | ICD-10-CM

## 2015-02-27 DIAGNOSIS — R29898 Other symptoms and signs involving the musculoskeletal system: Secondary | ICD-10-CM

## 2015-02-27 DIAGNOSIS — Z471 Aftercare following joint replacement surgery: Secondary | ICD-10-CM | POA: Diagnosis not present

## 2015-02-27 DIAGNOSIS — R262 Difficulty in walking, not elsewhere classified: Secondary | ICD-10-CM

## 2015-02-27 NOTE — Therapy (Signed)
[facility] [address] [place], [place], [address] Phone: [phone]   Fax:  [phone]  Physical Therapy Treatment  Patient Details  Name: Ralph Marquez MRN: [id number] Date of Birth: 08/15/1956 Referring Provider:  Lenis Dickinson., MD  Encounter Date: 02/27/2015      PT End of Session - 02/27/15 1548    Visit Number 7   Number of Visits 16   Date for PT Re-Evaluation 03/16/15   Authorization Type BCBS   Authorization - Visit Number 7   Authorization - Number of Visits 16   PT Start Time 1143   PT Stop Time 1227   PT Time Calculation (min) 44 min   Activity Tolerance Patient tolerated treatment well   Behavior During Therapy [facility] for tasks assessed/performed      No past medical history on file.  No past surgical history on file.  There were no vitals filed for this visit.  Visit Diagnosis:  Knee stiffness, right  Difficulty walking  Right leg weakness  Status post total right knee replacement      Subjective Assessment - 02/27/15 1540    Symptoms Patient having high levels of pain today, approximately 5/10; states that he is out of pain medicine but he has to get a new prescription from a doctor in [facility], so is trying to hold out until Thursday when he will get his refill.    Pertinent History histor of Lt knee meniscus tear that uimproved following surgery. Rt knee Replacement due to failed meniscus repair on it. Patient had surgery 01/23/15. 2 weeks of HHPt which resulted in a good HEP and improving strength. Patient ambulates wiht a cain everywhere except for very short distances in home. Initial injury occureded durign a fall due to tripping in a hole.    Currently in Pain? Yes   Pain Score 5    Pain Location Knee   Pain Orientation Right                       [facility] Adult PT Treatment/Exercise - 02/27/15 0001    Knee/Hip Exercises: Stretches   Active Hamstring Stretch 3 reps;30 seconds    Active Hamstring Stretch Limitations 14 inch box   Quad Stretch 30 seconds;2 reps   Quad Stretch Limitations prone with rope   Piriformis Stretch 2 reps;30 seconds   Piriformis Stretch Limitations seated   Gastroc Stretch 3 reps;30 seconds   Gastroc Stretch Limitations slantboard   Knee/Hip Exercises: Standing   Heel Raises 1 set;15 reps   Heel Raises Limitations slantboard    Forward Lunges 1 set;15 reps   Forward Lunges Limitations 4 inch box; between each rep, performed TKE with 2 second hold to assist in R knee extension   Terminal Knee Extension Right;10 reps   Theraband Level (Terminal Knee Extension) Level 3 (Green)   Terminal Knee Extension Limitations 5 second holds   Functional Squat 1 set;10 reps   Functional Squat Limitations sit to stand with no hands   Rocker Board 4 minutes   Rocker Board Limitations 2 minutes anterior/posterior, 2 minutes lateral   Other Standing Knee Exercises Hip IR walks   Knee/Hip Exercises: Supine   Bridges Both;1 set;15 reps   Bridges Limitations standard   Straight Leg Raises Both;1 set;10 reps   Knee/Hip Exercises: Sidelying   Hip ABduction Both;1 set;10 reps   Hip ABduction Limitations manual facilitation for form   Knee/Hip Exercises: Prone  Hip Extension Both;1 set;10 reps   Hip Extension Limitations manual facilitation for form   Manual Therapy   Manual Therapy Joint mobilization   Joint Mobilization Attempted gentle grade 1 joint mobilizations to R knee for extension to reduce pain; patient unable to tolerate at this time                PT Education - 02/27/15 1548    Education provided Yes   Education Details education regarding need for appropriate adjustments to exercise program in presence of significant pain   Person(s) Educated Patient   Methods Explanation   Comprehension Verbalized understanding          PT Short Term Goals - 02/23/15 1349    PT SHORT TERM GOAL #1   Title Patient will dmeonstrate full  knee extension to 0 degrees t be able to fully extend knee to normalize gait mechanics.    Status On-going   PT SHORT TERM GOAL #2   Title Patient will be able to flex knee to 110 degrees to squat to a chair   Status Achieved   PT SHORT TERM GOAL #3   Title Patient will be able to walk withtou a cain for 30minutes without fear of falling.    Status Achieved   PT SHORT TERM GOAL #4   Title Patiwent will be independent with HEP.   Status Achieved           PT Long Term Goals - 02/23/15 1350    PT LONG TERM GOAL #1   Title Patient will be able to flex knee to 120 degrees to squat to the floor   Status On-going   PT LONG TERM GOAL #2   Title Patient will be able to walk without a cain for 30minutes wit pain <2/10 on average.    Status On-going   PT LONG TERM GOAL #3   Title Patient will demosntrate 5/5/ Rt knee flexion/extension strength with ability to ambulate up and down stairs withtou UE support.    Status On-going               Plan - 02/27/15 1549    Clinical Impression Statement Patient presented with high levels of pain today due to having run out of his pain medicine and being unable to get refill until this Thursday. Adjusted today's session according to patient status with stretches, table exercises, and standing activities to tolerance. Attempted grade one extension mobilizations to R knee  to reduce pain however patient unable to tolerate for only 2-3 minutes.    Pt will benefit from skilled therapeutic intervention in order to improve on the following deficits Decreased strength;Pain;Abnormal gait;Difficulty walking;Decreased activity tolerance;Impaired flexibility;Decreased endurance;Improper body mechanics;Decreased range of motion   Rehab Potential Good   PT Frequency 2x / week   PT Duration 8 weeks   PT Treatment/Interventions Therapeutic exercise;Patient/family education;Gait training;Manual techniques;Stair training   PT Next Visit Plan FOcus on increasing  knee extension with a secondary focus on knee flexion ROM and strength.    PT Home Exercise Plan hamstring, hip flexor, quadriceps, and calf stretches. 20xeconds, multi directional.    Consulted and Agree with Plan of Care Patient        Problem List There are no active problems to display for this patient.   Nedra HaiKristen Kimarie Coor PT, DPT [phone]  [facility] [address] [place], [place], [address] Phone: [phone]   Fax:  [phone]

## 2015-02-28 ENCOUNTER — Ambulatory Visit (HOSPITAL_COMMUNITY): Payer: BLUE CROSS/BLUE SHIELD | Admitting: Physical Therapy

## 2015-02-28 DIAGNOSIS — R262 Difficulty in walking, not elsewhere classified: Secondary | ICD-10-CM

## 2015-02-28 DIAGNOSIS — Z471 Aftercare following joint replacement surgery: Secondary | ICD-10-CM | POA: Diagnosis not present

## 2015-02-28 DIAGNOSIS — Z96651 Presence of right artificial knee joint: Secondary | ICD-10-CM

## 2015-02-28 DIAGNOSIS — R29898 Other symptoms and signs involving the musculoskeletal system: Secondary | ICD-10-CM

## 2015-02-28 DIAGNOSIS — M25661 Stiffness of right knee, not elsewhere classified: Secondary | ICD-10-CM

## 2015-02-28 NOTE — Therapy (Signed)
[facility] [address] [place], [place], [address] Phone: [phone]   Fax:  [phone]  Physical Therapy Treatment  Patient Details  Name: Ralph Marquez MRN: [id number] Date of Birth: 24-Aug-1956 Referring Provider:  Lenis Marquez., MD  Encounter Date: 02/28/2015      PT End of Session - 02/28/15 1602    Visit Number 8   Number of Visits 16   Date for PT Re-Evaluation 03/28/15   Authorization Type BCBS   Authorization - Visit Number 8   Authorization - Number of Visits 16   Activity Tolerance Patient tolerated treatment well   Behavior During Therapy [facility] for tasks assessed/performed      No past medical history on file.  No past surgical history on file.  There were no vitals filed for this visit.  Visit Diagnosis:  Knee stiffness, right  Difficulty walking  Right leg weakness  Status post total right knee replacement      Subjective Assessment - 02/28/15 1459    Symptoms Patient continuing to have high levels of pain, still around 5/10; going to [facility] tomorrow to get refill on pain medicine   Pertinent History histor of Lt knee meniscus tear that uimproved following surgery. Rt knee Replacement due to failed meniscus repair on it. Patient had surgery 01/23/15. 2 weeks of HHPt which resulted in a good HEP and improving strength. Patient ambulates wiht a cain everywhere except for very short distances in home. Initial injury occureded durign a fall due to tripping in a hole.    Currently in Pain? Yes   Pain Score 5    Pain Location Knee   Pain Orientation Right            [facility] PT Assessment - 02/28/15 0001    Assessment   Medical Diagnosis Rt TKA   Onset Date 01/23/15   Next MD Visit Ralph Marquez, 03/01/15   AROM   Right Hip External Rotation  43   Right Hip Internal Rotation  36   Left Hip External Rotation  42   Left Hip Internal Rotation  35   Right Knee Extension -5   Right Knee Flexion 112    Strength   Right Hip Flexion 5/5   Right Hip Extension 4-/5   Right Hip ABduction 3+/5  pain limited   Left Hip Flexion 5/5   Left Hip Extension 4-/5   Left Hip ABduction 4-/5   Right Knee Flexion 4/5   Right Knee Extension 4/5   Left Knee Flexion 5/5   Left Knee Extension 5/5   Right Ankle Dorsiflexion 5/5   Left Ankle Dorsiflexion 5/5                   [facility] Adult PT Treatment/Exercise - 02/28/15 0001    Ambulation/Gait   Gait Comments Ascent/descent of stairs with B and U railings; patient demonstrates visible weakness and instability in R knee with both ascent/descent of stairs. Descent difficult   Knee/Hip Exercises: Stretches   Active Hamstring Stretch 3 reps;30 seconds   Active Hamstring Stretch Limitations 14 inch box   Quad Stretch 30 seconds;2 reps   Quad Stretch Limitations prone with rope   Piriformis Stretch 2 reps;30 seconds   Piriformis Stretch Limitations seated   Gastroc Stretch 3 reps;30 seconds   Gastroc Stretch Limitations slantboard   Knee/Hip Exercises: Aerobic   Stationary Bike 8 min initial session for ROM seat at 8   Knee/Hip Exercises: Standing  Forward Lunges 1 set;15 reps   Forward Lunges Limitations 4 inch box    Terminal Knee Extension Right;10 reps   Theraband Level (Terminal Knee Extension) Level 4 (Blue)   Terminal Knee Extension Limitations 2 second holds   Other Standing Knee Exercises Hip IR box walks 1x10   Other Standing Knee Exercises 3D hip excursions split stance 1x10; side stepping approx 2x5130ft                PT Education - 02/28/15 1600    Education provided Yes   Education Details education regarding progress with skilled PT services, overall prognosis; education regarding possible nerve injury from putting pressure down on walker, need for MD exam/diagnosis to confirm/make official nerve damage diagnosis   Person(s) Educated Patient   Methods Explanation   Comprehension Verbalized understanding           PT Short Term Goals - 02/28/15 1603    PT SHORT TERM GOAL #1   Title Patient will dmeonstrate full knee extension to 0 degrees t be able to fully extend knee to normalize gait mechanics.    Baseline 02/21/2015 -4-113   Time 4   Period Weeks   Status On-going   PT SHORT TERM GOAL #2   Title Patient will be able to flex knee to 110 degrees to squat to a chair   Baseline 02/21/2015 -4-113   Time 4   Period Weeks   Status Achieved   PT SHORT TERM GOAL #3   Title Patient will be able to walk withtou a cain for 30minutes without fear of falling.    Baseline Ambulates no AD, able to walk around [place] for an hour   Time 4   Period Weeks   Status Achieved   PT SHORT TERM GOAL #4   Title Patiwent will be independent with HEP.   Baseline Patient states that he does his best to adhere to HEP   Time 4   Period Weeks   Status Achieved           PT Long Term Goals - 02/28/15 1604    PT LONG TERM GOAL #1   Title Patient will be able to flex knee to 120 degrees to squat to the floor   Time 8   Period Weeks   Status On-going   PT LONG TERM GOAL #2   Title Patient will be able to walk without a cain for 30minutes wit pain <2/10 on average.    Time 8   Period Weeks   Status On-going   PT LONG TERM GOAL #3   Title Patient will demosntrate 5/5/ Rt knee flexion/extension strength with ability to ambulate up and down stairs withtou UE support.    Time 8   Period Weeks   Status On-going               Plan - 02/28/15 1602    Clinical Impression Statement Informal re-assessment performed today since patient requested write up for MD visit in the am. Patient does continue to display reduced knee range of motion on R, reduced R lower extremity and bilateral proximal muscle strength, difficulty with stairs, gait deviations including reduced TKE R/reduced gait tolerance overall due to pain/R knee instability, and current inability to participate in functional work tasks  due to R knee. Patient states he is concerned about returning to work since he would not at this point be able to perform work duties; also concerned regarding the numbness and tingling  he is experiencing in his hand likely from bearing down so hard on walker. Summary to be faxed to MD this afternoon. Patient will benefit from skilled PT services in order to address these impairments as well as to assist him in reaching optimal level of function.    Pt will benefit from skilled therapeutic intervention in order to improve on the following deficits Decreased strength;Pain;Abnormal gait;Difficulty walking;Decreased activity tolerance;Impaired flexibility;Decreased endurance;Improper body mechanics;Decreased range of motion   Rehab Potential Good   PT Frequency 2x / week   PT Duration 8 weeks   PT Treatment/Interventions Therapeutic exercise;Patient/family education;Gait training;Manual techniques;Stair training   PT Next Visit Plan FOcus on increasing knee extension with a secondary focus on knee flexion ROM and strength; proximal muscle strengthening   PT Home Exercise Plan hamstring, hip flexor, quadriceps, and calf stretches. 20xeconds, multi directional.    Consulted and Agree with Plan of Care Patient        Problem List There are no active problems to display for this patient.   Nedra Hai PT, DPT [phone]  [facility] [address] [place], [place], [address] Phone: [phone]   Fax:  [phone]

## 2015-03-02 ENCOUNTER — Ambulatory Visit (HOSPITAL_COMMUNITY): Payer: BLUE CROSS/BLUE SHIELD

## 2015-03-02 DIAGNOSIS — Z96651 Presence of right artificial knee joint: Secondary | ICD-10-CM

## 2015-03-02 DIAGNOSIS — R29898 Other symptoms and signs involving the musculoskeletal system: Secondary | ICD-10-CM

## 2015-03-02 DIAGNOSIS — Z471 Aftercare following joint replacement surgery: Secondary | ICD-10-CM | POA: Diagnosis not present

## 2015-03-02 DIAGNOSIS — R262 Difficulty in walking, not elsewhere classified: Secondary | ICD-10-CM

## 2015-03-02 DIAGNOSIS — M25661 Stiffness of right knee, not elsewhere classified: Secondary | ICD-10-CM

## 2015-03-02 NOTE — Therapy (Signed)
[place], [name], [address] Phone: [phone]   Fax:  [phone]  Physical Therapy Treatment  Patient Details  Name: Ralph Marquez MRN: [id number] Date of Birth: 1956/03/01 Referring Provider:  Ellamae Sia., MD  Encounter Date: 03/02/2015      PT End of Session - 03/02/15 1228    Visit Number 9   Number of Visits 16   Date for PT Re-Evaluation 03/28/15   Authorization Type BCBS   Authorization - Visit Number 9   Authorization - Number of Visits 16   PT Start Time 1448   PT Stop Time 1856   PT Time Calculation (min) 50 min   Activity Tolerance Patient tolerated treatment well   Behavior During Therapy [facility] for tasks assessed/performed      No past medical history on file.  No past surgical history on file.  There were no vitals filed for this visit.  Visit Diagnosis:  Knee stiffness, right  Difficulty walking  Right leg weakness  Status post total right knee replacement      Subjective Assessment - 03/02/15 1149    Symptoms Pt went to MD yesterday, MD happy with progress.  Given refill for pain meds with pain reduced this session;     Currently in Pain? Yes   Pain Score 2    Pain Location Knee   Pain Orientation Right;Anterior   Pain Descriptors / Indicators Dull                       [facility] Adult PT Treatment/Exercise - 03/02/15 0001    Exercises   Exercises Knee/Hip   Knee/Hip Exercises: Stretches   Active Hamstring Stretch 3 reps;30 seconds   Active Hamstring Stretch Limitations 14 inch box   Quad Stretch 30 seconds;3 reps   Quad Stretch Limitations prone with rope   Knee: Self-Stretch Limitations 10 knee drives on 8in step 3 directions   Piriformis Stretch 3 reps;30 seconds   Piriformis Stretch Limitations seated   Gastroc Stretch 3 reps;30 seconds   Gastroc Stretch Limitations slantboard   Knee/Hip Exercises: Aerobic   Stationary Bike 8 min initial session  for ROM seat at 8   Knee/Hip Exercises: Standing   Lateral Step Up Right;10 reps;Hand Hold: 2;Step Height: 4"   Forward Step Up Left;10 reps;Hand Hold: 1;Step Height: 4"   Other Standing Knee Exercises Hip IR box walks 1x10   Other Standing Knee Exercises 3D hip excursions split stance 1x10; side stepping approx 2x35f   Knee/Hip Exercises: Supine   Quad Sets 10 reps   Quad Sets Limitations 4-120   Short Arc Quad Sets 15 reps   Heel Slides 5 reps   Heel Slides Limitations 4-120   Terminal Knee Extension Limitations attempted                  PT Short Term Goals - 03/02/15 1259    PT SHORT TERM GOAL #1   Title Patient will dmeonstrate full knee extension to 0 degrees t be able to fully extend knee to normalize gait mechanics.    Status On-going   PT SHORT TERM GOAL #2   Title Patient will be able to flex knee to 110 degrees to squat to a chair   Status Achieved   PT SHORT TERM GOAL #3   Title Patient will be able to walk withtou a cain for 333mutes without fear of falling.    Status Achieved  PT SHORT TERM GOAL #4   Title Patiwent will be independent with HEP.   Status Achieved           PT Long Term Goals - 03/02/15 1259    PT LONG TERM GOAL #1   Title Patient will be able to flex knee to 120 degrees to squat to the floor   Baseline 03/02/2015 4-120   Status Partially Met   PT LONG TERM GOAL #2   Title Patient will be able to walk without a cain for 52mnutes wit pain <2/10 on average.    Status On-going   PT LONG TERM GOAL #3   Title Patient will demosntrate 5/5/ Rt knee flexion/extension strength with ability to ambulate up and down stairs withtou UE support.    Status On-going               Plan - 03/02/15 1255    Clinical Impression Statement Initial session focus on improving flexibility to improve ROM with primary focus on extension secondary flexion.  AROM 4-120 this session.  Progressed functional strengthening with stair training with most  difficulty with lateral step ups, required cuieng for proper tecnique and HHA for assistance.  End of session pt limited by musculautre fatigue with new activities, no reports of increased pain through session.     PT Next Visit Plan Gcode due next session.  Focus on increasing knee extension with a secondary focus on knee flexion ROM and strength; proximal muscle strengthening  Continue lunges and stair training as able.          Problem List There are no active problems to display for this patient.  CIhor Austin LSan Ygnacio CAldona Lento3/25/2016, 1:01 PM  [place]758 School DriveSMounds[place] [address]Phone: [phone]  Fax:  [phone]

## 2015-03-05 ENCOUNTER — Ambulatory Visit (HOSPITAL_COMMUNITY): Payer: BLUE CROSS/BLUE SHIELD | Admitting: Physical Therapy

## 2015-03-05 DIAGNOSIS — R29898 Other symptoms and signs involving the musculoskeletal system: Secondary | ICD-10-CM

## 2015-03-05 DIAGNOSIS — R262 Difficulty in walking, not elsewhere classified: Secondary | ICD-10-CM

## 2015-03-05 DIAGNOSIS — Z96651 Presence of right artificial knee joint: Secondary | ICD-10-CM

## 2015-03-05 DIAGNOSIS — Z471 Aftercare following joint replacement surgery: Secondary | ICD-10-CM | POA: Diagnosis not present

## 2015-03-05 DIAGNOSIS — M25661 Stiffness of right knee, not elsewhere classified: Secondary | ICD-10-CM

## 2015-03-05 NOTE — Therapy (Signed)
[place], [name], [address] Phone: [phone]   Fax:  [phone]  Physical Therapy Treatment  Patient Details  Name: Ralph Marquez MRN: [id number] Date of Birth: 02/28/1956 Referring Provider:  Ellamae Sia., MD  Encounter Date: 03/05/2015      PT End of Session - 03/05/15 1354    Visit Number 10   Number of Visits 16   Date for PT Re-Evaluation 03/28/15   Authorization Type BCBS   Authorization - Visit Number 10   Authorization - Number of Visits 16   Activity Tolerance Patient tolerated treatment well   Behavior During Therapy [facility] for tasks assessed/performed      No past medical history on file.  No past surgical history on file.  There were no vitals filed for this visit.  Visit Diagnosis:  Knee stiffness, right  Difficulty walking  Right leg weakness  Status post total right knee replacement      Subjective Assessment - 03/05/15 1303    Symptoms Patient states that he had a great weekend, got to see his grandchildren; his knee is continuing to feel better.    Pertinent History histor of Lt knee meniscus tear that uimproved following surgery. Rt knee Replacement due to failed meniscus repair on it. Patient had surgery 01/23/15. 2 weeks of HHPt which resulted in a good HEP and improving strength. Patient ambulates wiht a cain everywhere except for very short distances in home. Initial injury occureded durign a fall due to tripping in a hole.    Currently in Pain? Yes   Pain Score 2    Pain Location Knee   Pain Orientation Right                       [facility] Adult PT Treatment/Exercise - 03/05/15 0001    Knee/Hip Exercises: Stretches   Active Hamstring Stretch 3 reps;30 seconds   Active Hamstring Stretch Limitations 14 inch box, 3 way   Quad Stretch 30 seconds;3 reps   Quad Stretch Limitations prone with rope   Piriformis Stretch 3 reps;30 seconds   Piriformis Stretch  Limitations seated   Gastroc Stretch 3 reps;30 seconds   Gastroc Stretch Limitations slantboard   Knee/Hip Exercises: Standing   Forward Lunges 1 set;10 reps;Right   Forward Lunges Limitations 2 inch box   Side Lunges Both;1 set;10 reps   Terminal Knee Extension Right;15 reps   Theraband Level (Terminal Knee Extension) Level 4 (Blue)   Terminal Knee Extension Limitations 4 second holds   Forward Step Up 1 set;10 reps;Right   Forward Step Up Limitations 4 inch box   Rocker Board Other (comment)  x30 lateral and A-P, bilateral HHA with transition to no han   Other Standing Knee Exercises Hip IR box walks; patient states that he has had pain in his L hip that started soon after activity was introduced, so task shortened on that side   Other Standing Knee Exercises 3D hip excursions split stance 1x10                PT Education - 03/05/15 1349    Education provided Yes   Education Details Education regarding importance of CKC exercises as opposed to just OKC exercise for knee rehabiliation    Person(s) Educated Patient   Methods Explanation   Comprehension Verbalized understanding          PT Short Term Goals - 03/02/15 1259    PT  SHORT TERM GOAL #1   Title Patient will dmeonstrate full knee extension to 0 degrees t be able to fully extend knee to normalize gait mechanics.    Status On-going   PT SHORT TERM GOAL #2   Title Patient will be able to flex knee to 110 degrees to squat to a chair   Status Achieved   PT SHORT TERM GOAL #3   Title Patient will be able to walk withtou a cain for 29mnutes without fear of falling.    Status Achieved   PT SHORT TERM GOAL #4   Title Patiwent will be independent with HEP.   Status Achieved           PT Long Term Goals - 03/02/15 1259    PT LONG TERM GOAL #1   Title Patient will be able to flex knee to 120 degrees to squat to the floor   Baseline 03/02/2015 4-120   Status Partially Met   PT LONG TERM GOAL #2   Title  Patient will be able to walk without a cain for 326mutes wit pain <2/10 on average.    Status On-going   PT LONG TERM GOAL #3   Title Patient will demosntrate 5/5/ Rt knee flexion/extension strength with ability to ambulate up and down stairs withtou UE support.    Status On-going               Plan - 03/05/15 1354    Clinical Impression Statement Patient feeling better today, but states that he feels like one of the hardest things for him is to bear weight down through his surgical leg. Continued exercises today with increased focus on form and bearing weight down through R leg. Tolerated exercises well today. Patellar mobility checked and found to be generally mobile. Patient states that he is also continuing to massage and ice knee at home. Cues for exercise technique throughout session.   Pt will benefit from skilled therapeutic intervention in order to improve on the following deficits Decreased strength;Pain;Abnormal gait;Difficulty walking;Decreased activity tolerance;Impaired flexibility;Decreased endurance;Improper body mechanics;Decreased range of motion   Rehab Potential Good   PT Frequency 2x / week   PT Duration 8 weeks   PT Treatment/Interventions Therapeutic exercise;Patient/family education;Gait training;Manual techniques;Stair training   PT Next Visit Plan Focus on increasing knee extension with secondary focus on knee flexion range. Proximal muscle strength. Continue lunges and stairs as able. Increase focus on bearing weight down through R leg.    PT Home Exercise Plan hamstring, hip flexor, quadriceps, and calf stretches. 20xeconds, multi directional.    Consulted and Agree with Plan of Care Patient        Problem List There are no active problems to display for this patient.   KrDeniece ReeT, DPT [phone]hton[place]hone: [phone] Fax:   [phone]

## 2015-03-07 ENCOUNTER — Ambulatory Visit (HOSPITAL_COMMUNITY): Payer: BLUE CROSS/BLUE SHIELD | Admitting: Physical Therapy

## 2015-03-07 DIAGNOSIS — Z96651 Presence of right artificial knee joint: Secondary | ICD-10-CM

## 2015-03-07 DIAGNOSIS — R262 Difficulty in walking, not elsewhere classified: Secondary | ICD-10-CM

## 2015-03-07 DIAGNOSIS — M25661 Stiffness of right knee, not elsewhere classified: Secondary | ICD-10-CM

## 2015-03-07 DIAGNOSIS — R29898 Other symptoms and signs involving the musculoskeletal system: Secondary | ICD-10-CM

## 2015-03-07 NOTE — Therapy (Signed)
[place], [name], [address] Phone: [phone]   Fax:  [phone]  Physical Therapy Treatment  Patient Details  Name: Ralph Marquez MRN: [id number] Date of Birth: November 25, 1956 Referring Provider:  Ellamae Sia., MD  Encounter Date: 03/07/2015      PT End of Session - 03/07/15 1320    Visit Number 11   Number of Visits 16   Date for PT Re-Evaluation 03/28/15   Authorization Type BCBS   Authorization - Visit Number 11   Authorization - Number of Visits 16      No past medical history on file.  No past surgical history on file.  There were no vitals filed for this visit.  Visit Diagnosis:  Knee stiffness, right  Difficulty walking  Right leg weakness  Status post total right knee replacement      Subjective Assessment - 03/07/15 1318    Symptoms Patient arrived however feeling increasingly ill, ultimately decided to head home to rest    Pertinent History histor of Lt knee meniscus tear that uimproved following surgery. Rt knee Replacement due to failed meniscus repair on it. Patient had surgery 01/23/15. 2 weeks of HHPt which resulted in a good HEP and improving strength. Patient ambulates wiht a cain everywhere except for very short distances in home. Initial injury occureded durign a fall due to tripping in a hole.                                  PT Short Term Goals - 03/02/15 1259    PT SHORT TERM GOAL #1   Title Patient will dmeonstrate full knee extension to 0 degrees t be able to fully extend knee to normalize gait mechanics.    Status On-going   PT SHORT TERM GOAL #2   Title Patient will be able to flex knee to 110 degrees to squat to a chair   Status Achieved   PT SHORT TERM GOAL #3   Title Patient will be able to walk withtou a cain for 21mnutes without fear of falling.    Status Achieved   PT SHORT TERM GOAL #4   Title Patiwent will be independent with  HEP.   Status Achieved           PT Long Term Goals - 03/02/15 1259    PT LONG TERM GOAL #1   Title Patient will be able to flex knee to 120 degrees to squat to the floor   Baseline 03/02/2015 4-120   Status Partially Met   PT LONG TERM GOAL #2   Title Patient will be able to walk without a cain for 371mutes wit pain <2/10 on average.    Status On-going   PT LONG TERM GOAL #3   Title Patient will demosntrate 5/5/ Rt knee flexion/extension strength with ability to ambulate up and down stairs withtou UE support.    Status On-going               Plan - 03/07/15 1320    Clinical Impression Statement Patient arrived feeling ill; continued to feel worse in waiting room and ultimately decided to go home to rest. PT in agreement with course of action. No charge this visit.    Pt will benefit from skilled therapeutic intervention in order to improve on the following deficits Decreased strength;Pain;Abnormal gait;Difficulty walking;Decreased activity tolerance;Impaired flexibility;Decreased endurance;Improper body mechanics;Decreased range  of motion   Rehab Potential Good   PT Frequency 2x / week   PT Duration 8 weeks   PT Treatment/Interventions Therapeutic exercise;Patient/family education;Gait training;Manual techniques;Stair training   PT Next Visit Plan Focus on increasing knee extension with secondary focus on knee flexion range. Proximal muscle strength. Continue lunges and stairs as able. Increase focus on bearing weight down through R leg.    PT Home Exercise Plan hamstring, hip flexor, quadriceps, and calf stretches. 20xeconds, multi directional.    Consulted and Agree with Plan of Care Patient        Problem List There are no active problems to display for this patient.   Deniece Ree PT, DPT [phone]  [facility] [address] [place], [name], [address] Phone: [phone]   Fax:  [phone]

## 2015-03-08 NOTE — Addendum Note (Signed)
Addended by: Doyne KeelEWITT, Elie Leppo R on: 03/08/2015 03:39 PM   Modules accepted: Orders

## 2015-03-09 ENCOUNTER — Ambulatory Visit (HOSPITAL_COMMUNITY): Payer: BLUE CROSS/BLUE SHIELD | Attending: Sports Medicine | Admitting: Physical Therapy

## 2015-03-09 DIAGNOSIS — M6281 Muscle weakness (generalized): Secondary | ICD-10-CM | POA: Diagnosis not present

## 2015-03-09 DIAGNOSIS — M25661 Stiffness of right knee, not elsewhere classified: Secondary | ICD-10-CM | POA: Insufficient documentation

## 2015-03-09 DIAGNOSIS — R262 Difficulty in walking, not elsewhere classified: Secondary | ICD-10-CM | POA: Diagnosis not present

## 2015-03-09 DIAGNOSIS — M25561 Pain in right knee: Secondary | ICD-10-CM | POA: Diagnosis not present

## 2015-03-09 DIAGNOSIS — Z96651 Presence of right artificial knee joint: Secondary | ICD-10-CM | POA: Diagnosis not present

## 2015-03-09 DIAGNOSIS — Z471 Aftercare following joint replacement surgery: Secondary | ICD-10-CM | POA: Insufficient documentation

## 2015-03-09 DIAGNOSIS — R29898 Other symptoms and signs involving the musculoskeletal system: Secondary | ICD-10-CM

## 2015-03-09 NOTE — Therapy (Signed)
[place], [name], [address] Phone: [phone]   Fax:  [phone]  Physical Therapy Treatment  Patient Details  Name: Ralph Marquez MRN: [id number] Date of Birth: Mar 08, 1956 Referring Provider:  Ellamae Sia., MD  Encounter Date: 03/09/2015      PT End of Session - 03/09/15 0846    Visit Number 12   Number of Visits 16   Date for PT Re-Evaluation 03/28/15   Authorization Type BCBS   Authorization - Visit Number 12   Authorization - Number of Visits 16   PT Start Time 0800   PT Stop Time 0846   PT Time Calculation (min) 46 min   Activity Tolerance Patient tolerated treatment well   Behavior During Therapy [facility] for tasks assessed/performed      No past medical history on file.  No past surgical history on file.  There were no vitals filed for this visit.  Visit Diagnosis:  Knee stiffness, right  Difficulty walking  Right leg weakness  Status post total right knee replacement      Subjective Assessment - 03/09/15 0802    Symptoms Patient says he is feeling much better today, did take a pain pill this morning and has about a 4 hour drive tomorrow to take his grand-daughter back home.    Pertinent History histor of Lt knee meniscus tear that uimproved following surgery. Rt knee Replacement due to failed meniscus repair on it. Patient had surgery 01/23/15. 2 weeks of HHPt which resulted in a good HEP and improving strength. Patient ambulates wiht a cain everywhere except for very short distances in home. Initial injury occureded durign a fall due to tripping in a hole.    Currently in Pain? Yes   Pain Score 3    Pain Location Knee   Pain Orientation Right                       [facility] Adult PT Treatment/Exercise - 03/09/15 0001    Knee/Hip Exercises: Stretches   Active Hamstring Stretch 3 reps;30 seconds   Active Hamstring Stretch Limitations 14 inch box, 3 way   Quad Stretch 30  seconds;3 reps   Quad Stretch Limitations prone with rope   Piriformis Stretch 3 reps;30 seconds   Piriformis Stretch Limitations seated   Gastroc Stretch 3 reps;30 seconds   Gastroc Stretch Limitations slantboard   Knee/Hip Exercises: Aerobic   Stationary Bike 8 min, seat at 7   Knee/Hip Exercises: Standing   Forward Lunges 1 set;10 reps   Forward Lunges Limitations 2 inch box   Side Lunges Both;1 set;10 reps   Side Lunges Limitations 2 inch box    Terminal Knee Extension Right;15 reps   Theraband Level (Terminal Knee Extension) Level 4 (Blue)   Terminal Knee Extension Limitations 4 second holds   Forward Step Up 1 set;15 reps   Forward Step Up Limitations 4 inch box   Other Standing Knee Exercises 3D hip excursions toe touch 1x10                PT Education - 03/09/15 [phone]    Education provided Yes   Education Details Education on possible muscle fatigue in hand secondary to possible nerve compression from AD; education that MD would need to confirm any damage/compression to nerve   Person(s) Educated Patient   Methods Explanation   Comprehension Verbalized understanding          PT Short  Term Goals - 03/02/15 1259    PT SHORT TERM GOAL #1   Title Patient will dmeonstrate full knee extension to 0 degrees t be able to fully extend knee to normalize gait mechanics.    Status On-going   PT SHORT TERM GOAL #2   Title Patient will be able to flex knee to 110 degrees to squat to a chair   Status Achieved   PT SHORT TERM GOAL #3   Title Patient will be able to walk withtou a cain for 41mnutes without fear of falling.    Status Achieved   PT SHORT TERM GOAL #4   Title Pati[place] will be independent with HEP.   Status Achieved           PT Long Term Goals - 03/02/15 1259    PT LONG TERM GOAL #1   Title Patient will be able to flex knee to 120 degrees to squat to the floor   Baseline 03/02/2015 4-120   Status Partially Met   PT LONG TERM GOAL #2   Title  Patient will be able to walk without a cain for 376mutes wit pain <2/10 on average.    Status On-going   PT LONG TERM GOAL #3   Title Patient will demosntrate 5/5/ Rt knee flexion/extension strength with ability to ambulate up and down stairs withtou UE support.    Status On-going               Plan - 03/09/15 0846    Clinical Impression Statement Patient arrived feeling much better today, but states that he has had some new trembling in his hand that has been having numbness and tingling, especially after holding onto lawnmower steering wheel for extended period of time. Education that may be muscle fatigue related to possible nerve compression but MD would have to confirm. Continued to progress functional exercises with good over tolerance and response by patient.   Pt will benefit from skilled therapeutic intervention in order to improve on the following deficits Decreased strength;Pain;Abnormal gait;Difficulty walking;Decreased activity tolerance;Impaired flexibility;Decreased endurance;Improper body mechanics;Decreased range of motion   Rehab Potential Good   PT Frequency 2x / week   PT Duration 8 weeks   PT Treatment/Interventions Therapeutic exercise;Patient/family education;Gait training;Manual techniques;Stair training   PT Next Visit Plan Focus on increasing knee extension with secondary focus on knee flexion range. Proximal muscle strength. Continue lunges and stairs as able. Increase focus on bearing weight down through R leg.    PT Home Exercise Plan hamstring, hip flexor, quadriceps, and calf stretches. 20xeconds, multi directional.    Consulted and Agree with Plan of Care Patient        Problem List There are no active problems to display for this patient.   KrDeniece ReeT, DPT [phone] [place]: [phone] Fax:  [phone]

## 2015-03-12 ENCOUNTER — Ambulatory Visit (HOSPITAL_COMMUNITY): Payer: BLUE CROSS/BLUE SHIELD | Admitting: Physical Therapy

## 2015-03-12 DIAGNOSIS — R29898 Other symptoms and signs involving the musculoskeletal system: Secondary | ICD-10-CM

## 2015-03-12 DIAGNOSIS — Z471 Aftercare following joint replacement surgery: Secondary | ICD-10-CM | POA: Diagnosis not present

## 2015-03-12 DIAGNOSIS — Z96651 Presence of right artificial knee joint: Secondary | ICD-10-CM

## 2015-03-12 DIAGNOSIS — R262 Difficulty in walking, not elsewhere classified: Secondary | ICD-10-CM

## 2015-03-12 DIAGNOSIS — M25661 Stiffness of right knee, not elsewhere classified: Secondary | ICD-10-CM

## 2015-03-12 NOTE — Therapy (Signed)
[place], [name], [address] Phone: [phone]   Fax:  [phone]  Physical Therapy Treatment  Patient Details  Name: Ralph Marquez MRN: [id number] Date of Birth: 11-Oct-1956 Referring Provider:  Ellamae Sia., MD  Encounter Date: 03/12/2015      PT End of Session - 03/12/15 1348    Visit Number 13   Number of Visits 16   Date for PT Re-Evaluation 03/28/15   Authorization Type BCBS   Authorization - Visit Number 13   Authorization - Number of Visits 16   PT Start Time 1300   PT Stop Time 1343   PT Time Calculation (min) 43 min   Activity Tolerance Patient tolerated treatment well   Behavior During Therapy [facility] for tasks assessed/performed      No past medical history on file.  No past surgical history on file.  There were no vitals filed for this visit.  Visit Diagnosis:  Knee stiffness, right  Difficulty walking  Right leg weakness  Status post total right knee replacement      Subjective Assessment - 03/12/15 1302    Subjective Patient states he is feeling good today; says his drive went well but it was just very long. MD appointment in a month.    Pertinent History histor of Lt knee meniscus tear that uimproved following surgery. Rt knee Replacement due to failed meniscus repair on it. Patient had surgery 01/23/15. 2 weeks of HHPt which resulted in a good HEP and improving strength. Patient ambulates wiht a cain everywhere except for very short distances in home. Initial injury occureded durign a fall due to tripping in a hole.    Currently in Pain? Yes   Pain Score 2    Pain Location Knee                       [facility] Adult PT Treatment/Exercise - 03/12/15 0001    Knee/Hip Exercises: Stretches   Active Hamstring Stretch 3 reps;30 seconds   Active Hamstring Stretch Limitations 14 inch box, 3 way   Quad Stretch 30 seconds;3 reps   Quad Stretch Limitations prone with rope   Piriformis Stretch 3 reps;30 seconds   Piriformis Stretch Limitations seated   Gastroc Stretch 3 reps;30 seconds   Gastroc Stretch Limitations slantboard   Knee/Hip Exercises: Aerobic   Stationary Bike 8 min at seat 7   Knee/Hip Exercises: Standing   Forward Lunges 1 set;15 reps;Both   Forward Lunges Limitations 2 inch box   Side Lunges Both;1 set;15 reps   Side Lunges Limitations 2 inch box    Forward Step Up 1 set;10 reps   Forward Step Up Limitations on stairs   Step Down 1 set;10 reps   Step Down Limitations 2 inch box   Rocker Board Limitations --  x20 A-P, x20 lateral with no hands- poor eccentric control    Other Standing Knee Exercises Walking lateral hip excursion                 PT Education - 03/12/15 1338    Education provided Yes   Education Details Education on eccentric muscle contraction and importance of this type of contraction in functional work tasks    [employer]) Educated Patient   Methods Explanation   Comprehension Verbalized understanding          PT Short Term Goals - 03/02/15 1259    PT SHORT TERM GOAL #1  Title Patient will dmeonstrate full knee extension to 0 degrees t be able to fully extend knee to normalize gait mechanics.    Status On-going   PT SHORT TERM GOAL #2   Title Patient will be able to flex knee to 110 degrees to squat to a chair   Status Achieved   PT SHORT TERM GOAL #3   Title Patient will be able to walk withtou a cain for 97mnutes without fear of falling.    Status Achieved   PT SHORT TERM GOAL #4   Title Pati[place] will be independent with HEP.   Status Achieved           PT Long Term Goals - 03/02/15 1259    PT LONG TERM GOAL #1   Title Patient will be able to flex knee to 120 degrees to squat to the floor   Baseline 03/02/2015 4-120   Status Partially Met   PT LONG TERM GOAL #2   Title Patient will be able to walk without a cain for 352mutes wit pain <2/10 on average.    Status On-going   PT LONG TERM  GOAL #3   Title Patient will demosntrate 5/5/ Rt knee flexion/extension strength with ability to ambulate up and down stairs withtou UE support.    Status On-going               Plan - 03/12/15 1349    Clinical Impression Statement Progressed patient's functional exercise program today- patient displays significant difficulty with eccentric contraction of quadriceps at this time, and will benefit from continued exercises addressing this aspect of muscle function in order to increase quality of functional task performance. Mild knee pain with eccentric tasks, indicating reduced knee stability.    Pt will benefit from skilled therapeutic intervention in order to improve on the following deficits Decreased strength;Pain;Abnormal gait;Difficulty walking;Decreased activity tolerance;Impaired flexibility;Decreased endurance;Improper body mechanics;Decreased range of motion   Rehab Potential Good   PT Frequency 2x / week   PT Duration 8 weeks   PT Treatment/Interventions Therapeutic exercise;Patient/family education;Gait training;Manual techniques;Stair training   PT Next Visit Plan Focus on improving knee extension; proximal muscle strength; introduce lunges to floor and continue eccentric quadricep exercise. Improve weight bearing down through right leg.    PT Home Exercise Plan hamstring, hip flexor, quadriceps, and calf stretches. 20xeconds, multi directional.    Consulted and Agree with Plan of Care Patient        Problem List There are no active problems to display for this patient.   KrDeniece ReeT, DPT [phone] River StreettStatham[place]hone: [phone] Fax:  [phone]

## 2015-03-14 ENCOUNTER — Ambulatory Visit (HOSPITAL_COMMUNITY): Payer: BLUE CROSS/BLUE SHIELD

## 2015-03-14 DIAGNOSIS — M25661 Stiffness of right knee, not elsewhere classified: Secondary | ICD-10-CM

## 2015-03-14 DIAGNOSIS — Z471 Aftercare following joint replacement surgery: Secondary | ICD-10-CM | POA: Diagnosis not present

## 2015-03-14 DIAGNOSIS — R262 Difficulty in walking, not elsewhere classified: Secondary | ICD-10-CM

## 2015-03-14 DIAGNOSIS — R29898 Other symptoms and signs involving the musculoskeletal system: Secondary | ICD-10-CM

## 2015-03-14 DIAGNOSIS — Z96651 Presence of right artificial knee joint: Secondary | ICD-10-CM

## 2015-03-14 NOTE — Therapy (Signed)
[facility] [address] [place], [place], [address] Phone: [phone]   Fax:  [phone]  Physical Therapy Treatment  Patient Details  Name: Ralph NedMichael T Mondry MRN: [id number] Date of Birth: 04/21/1956 Referring Provider:  Lenis DickinsonWeller, Edward B., MD  Encounter Date: 03/14/2015      PT End of Session - 03/14/15 1400    Visit Number 14   Number of Visits 16   Date for PT Re-Evaluation 03/28/15   Authorization Type BCBS   Authorization Time Period G-code done 10th visit   Authorization - Visit Number 14   Authorization - Number of Visits 16   PT Start Time 1301   PT Stop Time 1348   PT Time Calculation (min) 47 min   Activity Tolerance Patient tolerated treatment well;Patient limited by fatigue   Behavior During Therapy [facility] for tasks assessed/performed      No past medical history on file.  No past surgical history on file.  There were no vitals filed for this visit.  Visit Diagnosis:  Knee stiffness, right  Difficulty walking  Right leg weakness  Status post total right knee replacement      Subjective Assessment - 03/14/15 1304    Subjective Pt stated he was sore today, pain scale 2-3/10 today.   Currently in Pain? Yes   Pain Score 3    Pain Location Knee   Pain Orientation Right;Anterior   Pain Descriptors / Indicators Link SnufferDull            [facility] PT Assessment - 03/14/15 0001    Assessment   Medical Diagnosis Rt TKA   Onset Date 01/23/15   Next MD Visit Loistine ChanceEdward Weller, 04/12/15             [facility] - 03/14/15 0001    Knee/Hip Exercises: Stretches   Active Hamstring Stretch 3 reps;30 seconds   Active Hamstring Stretch Limitations 14 inch box, 3 way   Quad Stretch 30 seconds;3 reps   Quad Stretch Limitations prone with rope   Gastroc Stretch 3 reps;30 seconds   Gastroc Stretch Limitations slantboard   Knee/Hip Exercises: Aerobic   Stationary Bike 8 min at seat 6   Knee/Hip Exercises:  Standing   Forward Lunges Both;10 reps   Forward Lunges Limitations on floor    Side Lunges Both;5 reps   Side Lunges Limitations on floor    Terminal Knee Extension Right;20 reps;Theraband   Theraband Level (Terminal Knee Extension) Level 4 (Blue)   Terminal Knee Extension Limitations 4 second holds HEP   Lateral Step Up Both;10 reps;Hand Hold: 2;Step Height: 6"   Step Down 1 set;5 reps;Step Height: 2"   Step Down Limitations single leg balance reach sagital and frontal plane   Stairs Reciprocal 7in ascend and descend   Rocker Board 2 minutes   Rocker Board Limitations r/L   Other Standing Knee Exercises 2D walking excursion   Other Standing Knee Exercises 3D hip excurstion split stance                PT Education - 03/14/15 1410    Education provided Yes   Education Details Education on compression hose benefits;  Pt given blue theraband for standing TKE   Person(s) Educated Patient   Methods Explanation;Demonstration;Handout   Comprehension Verbalized understanding;Returned demonstration          PT Short Term Goals - 03/14/15 1409    PT SHORT TERM GOAL #1   Title Patient will dmeonstrate full  knee extension to 0 degrees t be able to fully extend knee to normalize gait mechanics.    Status On-going   PT SHORT TERM GOAL #2   Title Patient will be able to flex knee to 110 degrees to squat to a chair   Status Achieved   PT SHORT TERM GOAL #3   Title Patient will be able to walk withtou a cain for without fear of falling.    Status Achieved   PT SHORT TERM GOAL #4   Title Pati[place] will be independent with HEP.   Status Achieved           PT Long Term Goals - 03/14/15 1410    PT LONG TERM GOAL #1   Title Patient will be able to flex knee to 120 degrees to squat to the floor   PT LONG TERM GOAL #2   Title Patient will be able to walk without a cain for wit pain <2/10 on average.    Status On-going   PT LONG TERM GOAL #3   Title Patient  will demosntrate 5/5/ Rt knee flexion/extension strength with ability to ambulate up and down stairs withtou UE support.    Status On-going               Plan - 03/14/15 1403    Clinical Impression Statement Progress functional strengthening with lunges on floor and eccentric quad strengthening to improve control descending stairs.  Visible musculature fatigue with new activities.  Continued with weight shifting activities to improve stance phase with gait mechanics.  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  Pt educated on benefits of compression hose to assist with edema control and varicose veins.  Referral sent to MD and handout given to pt.  Pt reported mild increased in pain at end of session following new activities.  Pt encouraged to apply ice with elevation at home.  Pt stated he was running low on pain medication, plans to call MD for refill.   PT Next Visit Plan Focus on improving knee extension; proximal muscle strength; introduce lunges to floor and continue eccentric quadricep exercise. Improve weight bearing down through right leg.         Problem List There are no active problems to display for this patient.  [address], [place]; [place] [address] [phone]  Juel Burrow 03/14/2015, 2:12 PM  [facility] [address] [place], [place], [address] Phone: [phone]   Fax:  [phone]

## 2015-03-16 ENCOUNTER — Ambulatory Visit (HOSPITAL_COMMUNITY): Payer: BLUE CROSS/BLUE SHIELD

## 2015-03-16 DIAGNOSIS — Z96651 Presence of right artificial knee joint: Secondary | ICD-10-CM

## 2015-03-16 DIAGNOSIS — R262 Difficulty in walking, not elsewhere classified: Secondary | ICD-10-CM

## 2015-03-16 DIAGNOSIS — R29898 Other symptoms and signs involving the musculoskeletal system: Secondary | ICD-10-CM

## 2015-03-16 DIAGNOSIS — Z471 Aftercare following joint replacement surgery: Secondary | ICD-10-CM | POA: Diagnosis not present

## 2015-03-16 DIAGNOSIS — M25661 Stiffness of right knee, not elsewhere classified: Secondary | ICD-10-CM

## 2015-03-16 NOTE — Therapy (Signed)
Ralph Marquez, [name], [address] Phone: [phone]   Fax:  [phone]  Physical Therapy Treatment  Patient Details  Name: Ralph Marquez MRN: [id number] Date of Birth: 03/15/1956 Referring Provider:  Ellamae Marquez., MD  Encounter Date: 03/16/2015      PT End of Session - 03/16/15 1316    Visit Number 15   Number of Visits 16   Date for PT Re-Evaluation 03/28/15   Authorization Type BCBS   Authorization Time Period G-code done 10th visit; 02/14/2015-04/16/2015   Authorization - Visit Number 15   Authorization - Number of Visits 16   PT Start Time 5638   PT Stop Time (p) 1347   PT Time Calculation (min) (p) 35 min   Activity Tolerance Patient tolerated treatment well;Patient limited by pain   Behavior During Therapy [facility] for tasks assessed/performed      No past medical history on file.  No past surgical history on file.  There were no vitals filed for this visit.  Visit Diagnosis:  Knee stiffness, right  Right leg weakness  Status post total right knee replacement  Difficulty walking      Subjective Assessment - 03/16/15 1312    Subjective Pt reported increased sharp lower back pain 3/10 today, Rt knee feels tight today 2/10   Currently in Pain? Yes   Pain Score 3    Pain Location Back   Pain Orientation Lower;Left   Pain Descriptors / Indicators Sharp   Pain Score 2   Pain Location Knee   Pain Orientation Right   Pain Descriptors / Indicators Dull;Tightness            [facility] PT Assessment - 03/16/15 0001    Assessment   Medical Diagnosis Rt TKA   Onset Date 01/23/15   Next MD Visit Ralph Marquez, 04/12/15                   [place] Adult PT Treatment/Exercise - 03/16/15 0001    Knee/Hip Exercises: Stretches   Active Hamstring Stretch 3 reps;30 seconds   Active Hamstring Stretch Limitations 14 inch box, 3 way   Sports administrator 30 seconds;3 reps   Quad Stretch Limitations  prone with rope   Gastroc Stretch 3 reps;30 seconds   Gastroc Stretch Limitations slantboard   Knee/Hip Exercises: Aerobic   Stationary Bike 8 min at seat 6   Knee/Hip Exercises: Standing   Forward Lunges Both;10 reps   Forward Lunges Limitations on floor    Side Lunges Both;5 reps   Side Lunges Limitations on floor    Lateral Step Up Both;10 reps;Hand Hold: 2;Step Height: 4"   Forward Step Up Both;10 reps;Hand Hold: 1;Step Height: 6"   Step Down Limitations increase back pain with exercises   Other Standing Knee Exercises 3D hip excurstion split stance sagital and frontal plane only                  PT Short Term Goals - 03/16/15 1328    PT SHORT TERM GOAL #1   Title Patient will dmeonstrate full knee extension to 0 degrees t be able to fully extend knee to normalize gait mechanics.    PT SHORT TERM GOAL #2   Title Patient will be able to flex knee to 110 degrees to squat to a chair   Status Achieved   PT SHORT TERM GOAL #3   Title Patient will be able to walk withtou a cain for  70mnutes without fear of falling.    Status Achieved   PT SHORT TERM GOAL #4   Title Patiwent will be independent with HEP.   Status Achieved           PT Long Term Goals - 03/16/15 1329    PT LONG TERM GOAL #1   Title Patient will be able to flex knee to 120 degrees to squat to the floor   Baseline 03/02/2015 4-120   Status Partially Met   PT LONG TERM GOAL #2   Title Patient will be able to walk without a cain for 374mutes wit pain <2/10 on average.    Status On-going   PT LONG TERM GOAL #3   Title Patient will demosntrate 5/5/ Rt knee flexion/extension strength with ability to ambulate up and down stairs withtou UE support.    Status On-going               Plan - 03/16/15 1318    Clinical Impression Statement This session held transverse plan activiites following report of increased back pain.  Session focus on improving hiip mopbilty and knee ROM as well as functional  strenghtening as tolerable.  Recieved referral for compression hose for edema control.     PT Next Visit Plan Reassess next session.  Focus on improving knee extension; proximal muscle strength;Continue wiht  lunges to floor and continue eccentric quadricep exercise. Improve weight bearing down through right leg.         Problem List There are no active problems to display for this patient.  [address] [phone] Lento/07/2015, 2:33 PM  [place]hone: [phone] Fax:  [phone]

## 2015-03-19 ENCOUNTER — Ambulatory Visit (HOSPITAL_COMMUNITY): Payer: BLUE CROSS/BLUE SHIELD | Admitting: Physical Therapy

## 2015-03-19 DIAGNOSIS — R29898 Other symptoms and signs involving the musculoskeletal system: Secondary | ICD-10-CM

## 2015-03-19 DIAGNOSIS — Z471 Aftercare following joint replacement surgery: Secondary | ICD-10-CM | POA: Diagnosis not present

## 2015-03-19 DIAGNOSIS — Z96651 Presence of right artificial knee joint: Secondary | ICD-10-CM

## 2015-03-19 DIAGNOSIS — R262 Difficulty in walking, not elsewhere classified: Secondary | ICD-10-CM

## 2015-03-19 DIAGNOSIS — M25661 Stiffness of right knee, not elsewhere classified: Secondary | ICD-10-CM

## 2015-03-19 NOTE — Therapy (Signed)
[place], [name], [address] Phone: [phone]   Fax:  [phone]  Physical Therapy Treatment  Patient Details  Name: Ralph Marquez MRN: [id number] Date of Birth: 12-11-1955 Referring Provider:  Ellamae Sia., MD  Encounter Date: 03/19/2015      PT End of Session - 03/19/15 1349    Visit Number 16   Number of Visits 16   Date for PT Re-Evaluation 03/28/15   Authorization Type BCBS   Authorization Time Period 02/14/15 to 04/16/15   Authorization - Visit Number 16   Authorization - Number of Visits 16   Activity Tolerance Patient tolerated treatment well   Behavior During Therapy [facility] for tasks assessed/performed      No past medical history on file.  No past surgical history on file.  There were no vitals filed for this visit.  Visit Diagnosis:  Knee stiffness, right  Right leg weakness  Status post total right knee replacement  Difficulty walking      Subjective Assessment - 03/19/15 1304    Subjective Patient arrived wearing compression stocking on R leg; states his back is better but is still bothering him a little bit today   Pertinent History histor of Lt knee meniscus tear that uimproved following surgery. Rt knee Replacement due to failed meniscus repair on it. Patient had surgery 01/23/15. 2 weeks of HHPt which resulted in a good HEP and improving strength. Patient ambulates wiht a cain everywhere except for very short distances in home. Initial injury occureded durign a fall due to tripping in a hole.    Currently in Pain? Yes   Pain Score 3    Pain Location Back                       [facility] Adult PT Treatment/Exercise - 03/19/15 0001    Knee/Hip Exercises: Stretches   Active Hamstring Stretch 3 reps;30 seconds   Active Hamstring Stretch Limitations 14 inch box, 3 way   Quad Stretch 30 seconds;3 reps   Quad Stretch Limitations prone with rope    Piriformis Stretch 3  reps;30 seconds   Piriformis Stretch Limitations seated   Gastroc Stretch 3 reps;30 seconds   Gastroc Stretch Limitations slantboard, 3- way    Knee/Hip Exercises: Aerobic   Stationary Bike 8 min at seat 6   Knee/Hip Exercises: Standing   Forward Lunges Both;10 reps   Forward Lunges Limitations on floor    Side Lunges Both;10 reps   Side Lunges Limitations on floor    Terminal Knee Extension Right;20 reps;Theraband   Theraband Level (Terminal Knee Extension) Level 4 (Blue)   Terminal Knee Extension Limitations 4 second holds    Step Down Right;1 set;15 reps   Step Down Limitations 2 inch box    Rocker Board Limitations x20 A-P and x20 lateral, no hands    Other Standing Knee Exercises Eccentric sit to stands with 3-4 second lower 1x10                PT Education - 03/19/15 1338    Education provided No          PT Short Term Goals - 03/16/15 1328    PT SHORT TERM GOAL #1   Title Patient will dmeonstrate full knee extension to 0 degrees t be able to fully extend knee to normalize gait mechanics.    PT SHORT TERM GOAL #2   Title Patient will be able  to flex knee to 110 degrees to squat to a chair   Status Achieved   PT SHORT TERM GOAL #3   Title Patient will be able to walk withtou a cain for 63mnutes without fear of falling.    Status Achieved   PT SHORT TERM GOAL #4   Title Pati[place] will be independent with HEP.   Status Achieved           PT Long Term Goals - 03/16/15 1329    PT LONG TERM GOAL #1   Title Patient will be able to flex knee to 120 degrees to squat to the floor   Baseline 03/02/2015 4-120   Status Partially Met   PT LONG TERM GOAL #2   Title Patient will be able to walk without a cain for 344mutes wit pain <2/10 on average.    Status On-going   PT LONG TERM GOAL #3   Title Patient will demosntrate 5/5/ Rt knee flexion/extension strength with ability to ambulate up and down stairs withtou UE support.    Status On-going                Plan - 03/19/15 1349    Clinical Impression Statement Patient reported that his back pain had improved this session, however continued to hold transverse plane activities so as to not re-aggravate back. Focus on general lower extremity strength and on eccentric quad contractions, also knee ROM. Patient arrived wearing compression hose this date. Continues to overall demonstrate gross weakness around R knee as well as reduced ability to perform functional squatting and eccentric tasks, also crawling and kneeling activities.   Pt will benefit from skilled therapeutic intervention in order to improve on the following deficits Decreased strength;Pain;Abnormal gait;Difficulty walking;Decreased activity tolerance;Impaired flexibility;Decreased endurance;Improper body mechanics;Decreased range of motion   Rehab Potential Good   PT Frequency 2x / week   PT Duration 8 weeks   PT Treatment/Interventions Therapeutic exercise;Patient/family education;Gait training;Manual techniques;Stair training   PT Next Visit Plan Reassess next session.  Focus on improving knee extension; proximal muscle strength;Continue wiht  lunges to floor and continue eccentric quadricep exercise. Improve weight bearing down through right leg.    PT Home Exercise Plan hamstring, hip flexor, quadriceps, and calf stretches. 20xeconds, multi directional.    Consulted and Agree with Plan of Care Patient        Problem List There are no active problems to display for this patient.   KrDeniece ReeT, DPT [phone] River332 Division Courtt[place]hone: [phone] Fax:  [phone]

## 2015-03-21 ENCOUNTER — Ambulatory Visit (HOSPITAL_COMMUNITY): Payer: BLUE CROSS/BLUE SHIELD | Admitting: Physical Therapy

## 2015-03-21 DIAGNOSIS — Z471 Aftercare following joint replacement surgery: Secondary | ICD-10-CM | POA: Diagnosis not present

## 2015-03-21 DIAGNOSIS — M25661 Stiffness of right knee, not elsewhere classified: Secondary | ICD-10-CM

## 2015-03-21 DIAGNOSIS — R262 Difficulty in walking, not elsewhere classified: Secondary | ICD-10-CM

## 2015-03-21 DIAGNOSIS — Z96651 Presence of right artificial knee joint: Secondary | ICD-10-CM

## 2015-03-21 DIAGNOSIS — R29898 Other symptoms and signs involving the musculoskeletal system: Secondary | ICD-10-CM

## 2015-03-21 NOTE — Therapy (Signed)
[place], [name], [address] Phone: [phone]   Fax:  [phone]  Physical Therapy Treatment/Reassessment  Patient Details  Name: Ralph Marquez MRN: [id number] Date of Birth: 02-05-1956 Referring Provider:  Ellamae Sia., MD  Encounter Date: 03/21/2015      PT End of Session - 03/21/15 1322    Visit Number 17   Number of Visits 16   Date for PT Re-Evaluation 03/28/15   Authorization Type BCBS   Authorization Time Period 02/14/15 to 04/16/15; 03/21/15 to 05/21/15   Authorization - Visit Number 17   Authorization - Number of Visits 32   PT Start Time 1301   PT Stop Time 1345   PT Time Calculation (min) 44 min   Activity Tolerance Patient tolerated treatment well   Behavior During Therapy [facility] for tasks assessed/performed      No past medical history on file.  No past surgical history on file.  There were no vitals filed for this visit.  Visit Diagnosis:  Knee stiffness, right - Plan: PT plan of care cert/re-cert  Right leg weakness - Plan: PT plan of care cert/re-cert  Status post total right knee replacement - Plan: PT plan of care cert/re-cert  Difficulty walking - Plan: PT plan of care cert/re-cert      Subjective Assessment - 03/21/15 1307    Subjective Patient states mildly upset due to this bein a year since his daughter's passing, nStates ihe still feels unconfident on Rt LE resultign in him not being able to go up and down stairs. Notes his back has been bothering him lately.    Currently in Pain? Yes   Pain Score 2    Pain Location Knee  back            [facility] PT Assessment - 03/21/15 0001    Assessment   Medical Diagnosis Rt TKA   Onset Date 01/23/15   Next MD Visit Salvadore Farber, 04/12/15   AROM   Right Hip External Rotation  43   Right Hip Internal Rotation  36   Left Hip External Rotation  42   Left Hip Internal Rotation  35   Right Knee Extension 0   Right Knee  Flexion 120   Strength   Right Hip Flexion 5/5   Right Hip Extension 4/5   Right Hip ABduction 3+/5  pain limited   Left Hip Flexion 5/5   Left Hip Extension 4+/5   Left Hip ABduction 4-/5   Right Knee Flexion 4+/5   Right Knee Extension 4+/5   Left Knee Flexion 5/5   Left Knee Extension 5/5   Right Ankle Dorsiflexion 5/5   Left Ankle Dorsiflexion 5/5           [facility] Adult PT Treatment/Exercise - 03/21/15 0001    Knee/Hip Exercises: Stretches   Active Hamstring Stretch Limitations 6" box 10x 3" hold 3 way    Quad Stretch 30 seconds;3 reps   Quad Stretch Limitations prone with rope    Piriformis Stretch 3 reps;30 seconds   Piriformis Stretch Limitations seated   Gastroc Stretch 1 rep;30 seconds   Gastroc Stretch Limitations slantboard, 3- way    Knee/Hip Exercises: Standing   Heel Raises 2 sets;10 reps   Heel Raises Limitations on wedge   Forward Lunges Both;10 reps   Forward Lunges Limitations on floor    Side Lunges Both;10 reps   Side Lunges Limitations on floor    Terminal Knee  Extension Right;20 reps;Theraband   Theraband Level (Terminal Knee Extension) --   Terminal Knee Extension Limitations --   Lateral Step Up Both;10 reps;Hand Hold: 2;Step Height: 6"   Forward Step Up Both;10 reps;Hand Hold: 1;Step Height: 6"   Step Down Right;1 set;15 reps   Step Down Limitations 2 inch box    Other Standing Knee Exercises Sumo walk with red tband 60f RT; discontinued due to increasd back pain.    Other Standing Knee Exercises 1/2 kneeling on airex pad with anterior hip drives 135T           PT Short Term Goals - 03/21/15 1325    PT SHORT TERM GOAL #1   Title Patient will dmeonstrate full knee extension to 0 degrees t be able to fully extend knee to normalize gait mechanics.    Status Achieved   PT SHORT TERM GOAL #2   Title Patient will be able to flex knee to 110 degrees to squat to a chair   Status Achieved   PT SHORT TERM GOAL #3   Title Patient will be able  to walk withtou a cain for 354mutes without fear of falling.    Status Achieved   PT SHORT TERM GOAL #4   Title Pati[place] will be independent with HEP.   Status Achieved           PT Long Term Goals - 03/21/15 1324    PT LONG TERM GOAL #1   Title Patient will be able to flex knee to 120 degrees to squat to the floor   Status Achieved   PT LONG TERM GOAL #2   Title Patient will be able to walk without a cain for 3050mtes wit pain <2/10 on average.    Status Partially Met   PT LONG TERM GOAL #3   Title Patient will demosntrate 5/5/ Rt knee flexion/extension strength with ability to ambulate up and down stairs withtou UE support.    Status Partially Met               Plan - 03/21/15 1359    Clinical Impression Statement Patient notes continued minor back and knee pain thoguh he displays Rt knee ROm WNL 0-120 and improvign though still limited Rt LE strength. Patient is progressing well thoguh still limited strength resulting in continued need for occasional UE assistance fot balanc and to decrease loading due to pain. Recommendign continuing physical therapy 2x a week for 8 more weeks to achieve long term goals. Patient Lt low back pain attributed to Lt low back muscle strain and possible disc pathology as pain was relieved following L4L5 posteriro to anterior joint mobilizations and myofascial release fo Lt lw back multifidis.   PT Next Visit Plan Focus of therapy to shift to functional strengthening for return to work and increasing kneeling toleranc on Rt knee. Per pain tolerance increase step height, lunges and introduce squatting.         Problem List There are no active problems to display for this patient.  CasDevona Konig DPT [address] [name]one: [phone]:  [phone]

## 2015-03-23 ENCOUNTER — Ambulatory Visit (HOSPITAL_COMMUNITY): Payer: BLUE CROSS/BLUE SHIELD

## 2015-03-23 DIAGNOSIS — Z96651 Presence of right artificial knee joint: Secondary | ICD-10-CM

## 2015-03-23 DIAGNOSIS — M25661 Stiffness of right knee, not elsewhere classified: Secondary | ICD-10-CM

## 2015-03-23 DIAGNOSIS — R262 Difficulty in walking, not elsewhere classified: Secondary | ICD-10-CM

## 2015-03-23 DIAGNOSIS — R29898 Other symptoms and signs involving the musculoskeletal system: Secondary | ICD-10-CM

## 2015-03-23 DIAGNOSIS — Z471 Aftercare following joint replacement surgery: Secondary | ICD-10-CM | POA: Diagnosis not present

## 2015-03-23 NOTE — Patient Instructions (Addendum)
Step: Up, Lateral   Stand with side toward step. Place involved leg up. Raise body using top leg only. Step off other side, involved leg first, lower body using other leg. Repeat 10-15 times per set. Do 1-2 sets per session. Do 3-5 sessions per week.  Copyright  VHI. All rights reserved.   Step-Down / Step-Up   Stand on stair step or 6-8  inch stool. Slowly bend left leg, lowering other foot to floor. Return by straightening front leg. Repeat 10 times per set. Do 1-2 sets per session. Do 3-5 sessions per day.  http://orth.exer.us/684   Copyright  VHI. All rights reserved.  Forward Lunge   Standing with feet shoulder width apart and stomach tight, step forward with left leg. Repeat 10-15 times per set. Do 1-2 sets per session. Do 3-5 sessions per day.  http://orth.exer.us/1146   Copyright  VHI. All rights reserved  Lateral Hip Dominant Lunge   Hands clasped behind head, lunge to side, keeping erect posture. Bend knee as much as 90. Return by stepping to moving leg with other. Lunge 10 times with same leg, then with other. Do 1-2 sessions per day.   Copyright  VHI. All rights reserved.    Knee / Mills KollerQuad   Lie on stomach, knees together. Grab one ankle with same side hand. Use dog leash if needed to reach. Gently pull foot toward buttock. Hold 30 seconds. Repeat with other leg. Repeat 3 times. Do 1-2 sessions per day.  Copyright  VHI. All rights reserved.   Hamstring Stretch (Standing)   Standing, place one heel on chair or bench. Use one or both hands on thigh for support. Keeping torso straight, lean forward slowly until a stretch is felt in back of same thigh. Hold 30 seconds. Repeat with other leg.  Copyright  VHI. All rights reserved.   Hip Extension   Lie on back, legs in air, knees bent. Grasp hands behind one thigh and cross other leg over same thigh. Hold 30 seconds. Repeat with other leg held. Repeat 3 times. Do 1-2 sessions per day.  Copyright   VHI. All rights reserved.   Abduction: Side Leg Lift (Eccentric) - Side-Lying   Lie on side. Lift top leg slightly higher than shoulder level. Keep top leg straight with body, toes pointing forward. Slowly lower for 3-5 seconds. 10-20 reps per set, 1-2 sets per day, 3-5 days per week. Copyright  VHI. All rights reserved.

## 2015-03-23 NOTE — Therapy (Signed)
[facility] [address] [place], [place], [address] Phone: [phone]   Fax:  [phone]  Physical Therapy Treatment  Patient Details  Name: Ralph Marquez MRN: [id number] Date of Birth: 03/01/1956 Referring Provider:  Lenis DickinsonWeller, Edward B., MD  Encounter Date: 03/23/2015      PT End of Session - 03/23/15 1304    Visit Number 18   Number of Visits 32   Date for PT Re-Evaluation 04/18/15   Authorization Type BCBS   Authorization Time Period 02/14/15 to 04/16/15; 03/21/15 to 05/21/15   Authorization - Visit Number 18   Authorization - Number of Visits 32   PT Start Time 1300   PT Stop Time 1344   PT Time Calculation (min) 44 min   Activity Tolerance Patient tolerated treatment well   Behavior [facility] for tasks assessed/performed      No past medical history on file.  No past surgical history on file.  There were no vitals filed for this visit.  Visit Diagnosis:  Knee stiffness, right  Right leg weakness  Status post total right knee replacement  Difficulty walking      Subjective Assessment - 03/23/15 1305    Subjective Pt stated dull pain scale 2/10 Rt knee, currently wearing compression socks and stated relief wearing.     Currently in Pain? Yes   Pain Score 2    Pain Location Knee   Pain Orientation Right   Pain Descriptors / Indicators Dull;Sore                       [facility] Adult PT Treatment/Exercise - 03/23/15 0001    Knee/Hip Exercises: Stretches   Active Hamstring Stretch 3 reps;30 seconds   Active Hamstring Stretch Limitations 14in step   LobbyistQuad Stretch 30 seconds;3 reps   Quad Stretch Limitations prone with rope    Piriformis Stretch 3 reps;30 seconds   Piriformis Stretch Limitations seated   Gastroc Stretch 3 reps;30 seconds   Gastroc Stretch Limitations slantboard, 3- way    Knee/Hip Exercises: Standing   Forward Lunges Both;10 reps   Forward Lunges Limitations on floor    Lateral Step Up Both;10 reps;Hand Hold: 2;Step Height: 6"   Forward Step Up Both;Hand Hold: 1;Step Height: 6";15 reps   Functional Squat 10 reps   Functional Squat Limitations 3D hip excursion   Lunge Walking - Round Trips 2RT cueing for posture   Other Standing Knee Exercises Sumo walk with red tband 6430ft RT;                 PT Education - 03/23/15 1344    Education provided Yes   Education Details Given HEP to complete while out of therapy next week.     Person(s) Educated Patient   Methods Explanation;Demonstration;Handout   Comprehension Verbalized understanding;Returned demonstration          PT Short Term Goals - 03/23/15 1309    PT SHORT TERM GOAL #1   Title Patient will dmeonstrate full knee extension to 0 degrees t be able to fully extend knee to normalize gait mechanics.    Status Achieved   PT SHORT TERM GOAL #2   Title Patient will be able to flex knee to 110 degrees to squat to a chair   Status Achieved   PT SHORT TERM GOAL #3   Title Patient will be able to walk withtou a cain for 30minutes without fear of falling.  Status Achieved   PT SHORT TERM GOAL #4   Title Patiwent will be independent with HEP.   Status Achieved           PT Long Term Goals - 03/23/15 1310    PT LONG TERM GOAL #1   Title Patient will be able to flex knee to 120 degrees to squat to the floor   Status Achieved   PT LONG TERM GOAL #2   Title Patient will be able to walk without a cane for wit pain <2/10 on average.    Status On-going   PT LONG TERM GOAL #3   Title Patient will demosntrate 5/5/ Rt knee flexion/extension strength with ability to ambulate up and down stairs withtou UE support.    Status On-going               Plan - 03/23/15 1439    Clinical Impression Statement Pt stated he will not be able to attend PT next week, given advanced HEP to continue functional strengthening and stretches at home.  Pt able to demonstrate and verbalize  appropriate technique with all exercises today.  Progressed to lunge walking as a progressing towards kneeling for RTW activitieis, therapist facilitaiton for proper form with new exercise.   PT Next Visit Plan Focus of therapy to shift to functional strengthening for return to work and increasing kneeling toleranc on Rt knee. Per pain tolerance increase step height, lunges and introduce squatting.         Problem List There are no active problems to display for this patient.  [address] [place]; [place] #[address] [phone]  Ralph Marquez 03/23/2015, 2:46 PM  [place] [facility] [address] [place], [place], [address] Phone: [phone]   Fax:  [phone]

## 2015-03-26 ENCOUNTER — Ambulatory Visit (HOSPITAL_COMMUNITY): Payer: BLUE CROSS/BLUE SHIELD | Admitting: Physical Therapy

## 2015-03-26 DIAGNOSIS — Z471 Aftercare following joint replacement surgery: Secondary | ICD-10-CM | POA: Diagnosis not present

## 2015-03-26 DIAGNOSIS — R262 Difficulty in walking, not elsewhere classified: Secondary | ICD-10-CM

## 2015-03-26 DIAGNOSIS — Z96651 Presence of right artificial knee joint: Secondary | ICD-10-CM

## 2015-03-26 DIAGNOSIS — R29898 Other symptoms and signs involving the musculoskeletal system: Secondary | ICD-10-CM

## 2015-03-26 DIAGNOSIS — M25661 Stiffness of right knee, not elsewhere classified: Secondary | ICD-10-CM

## 2015-03-26 NOTE — Therapy (Signed)
[place] [facility] [address] [place], [place], [address] Phone: [phone]   Fax:  [phone]  Physical Therapy Treatment  Patient Details  Name: Ralph NedMichael T Flud MRN: [id number] Date of Birth: 06/28/1956 Referring Provider:  Lenis DickinsonWeller, Edward B., MD  Encounter Date: 03/26/2015      PT End of Session - 03/26/15 1433    Visit Number 19   Number of Visits 32   Date for PT Re-Evaluation 04/18/15   Authorization Type BCBS   Authorization Time Period 02/14/15 to 04/16/15; 03/21/15 to 05/21/15   Authorization - Visit Number 19   Authorization - Number of Visits 32   PT Start Time 1300   PT Stop Time 1343   PT Time Calculation (min) 43 min   Activity Tolerance Patient tolerated treatment well   Behavior During Therapy [facility] for tasks assessed/performed      No past medical history on file.  No past surgical history on file.  There were no vitals filed for this visit.  Visit Diagnosis:  Knee stiffness, right  Right leg weakness  Status post total right knee replacement  Difficulty walking      Subjective Assessment - 03/26/15 1301    Subjective Patient says he is preparing to go on an 8 hour drive to see his sister tomorrow; planning to break the drive down into 4-5 hour segments and stay the night in the middle   Pertinent History histor of Lt knee meniscus tear that uimproved following surgery. Rt knee Replacement due to failed meniscus repair on it. Patient had surgery 01/23/15. 2 weeks of HHPt which resulted in a good HEP and improving strength. Patient ambulates wiht a cain everywhere except for very short distances in home. Initial injury occureded durign a fall due to tripping in a hole.    Currently in Pain? Yes   Pain Score 3                          [facility] Adult PT Treatment/Exercise - 03/26/15 0001    Knee/Hip Exercises: Stretches   Active Hamstring Stretch 3 reps;30 seconds   Active Hamstring Stretch  Limitations 14 inch box, 3 way    Quad Stretch 3 reps;30 seconds   Quad Stretch Limitations prone with rope    Piriformis Stretch 3 reps;30 seconds   Piriformis Stretch Limitations seated   Gastroc Stretch 3 reps;30 seconds   Gastroc Stretch Limitations slantboard , 3 way    Knee/Hip Exercises: Standing   Forward Lunges Both;1 set;15 reps   Forward Lunges Limitations on floor    Side Lunges Both;1 set;10 reps   Side Lunges Limitations on floor    Lateral Step Up Both;1 set;15 reps   Lateral Step Up Limitations standard step    Forward Step Up Both;1 set;15 reps   Forward Step Up Limitations standard step    Step Down Both;1 set;10 reps   Step Down Limitations 4 inch box    Functional Squat 1 set;10 reps   Functional Squat Limitations toes neutral    Other Standing Knee Exercises Kneeling anterior hip drives 1x10 on heightened foam pad                 PT Education - 03/26/15 1433    Education provided Yes   Education Details Review of HEP for use while out of town next week    Person(s) Educated Patient   Methods Explanation   Comprehension Verbalized understanding  PT Short Term Goals - 03/23/15 1309    PT SHORT TERM GOAL #1   Title Patient will dmeonstrate full knee extension to 0 degrees t be able to fully extend knee to normalize gait mechanics.    Status Achieved   PT SHORT TERM GOAL #2   Title Patient will be able to flex knee to 110 degrees to squat to a chair   Status Achieved   PT SHORT TERM GOAL #3   Title Patient will be able to walk withtou a cain for without fear of falling.    Status Achieved   PT SHORT TERM GOAL #4   Title Patiwent will be independent with HEP.   Status Achieved           PT Long Term Goals - 03/23/15 1310    PT LONG TERM GOAL #1   Title Patient will be able to flex knee to 120 degrees to squat to the floor   Status Achieved   PT LONG TERM GOAL #2   Title Patient will be able to walk without a cane for  wit pain <2/10 on average.    Status On-going   PT LONG TERM GOAL #3   Title Patient will demosntrate 5/5/ Rt knee flexion/extension strength with ability to ambulate up and down stairs withtou UE support.    Status On-going               Plan - 03/26/15 1437    Clinical Impression Statement Continued functional exercise program today and general review of HEP with good performance of exercises throughout. Re-attempted forward hip excursions in kneeling on more elevated surface; patient more able to perform task but continues to having difficulty in kneeling due to soreness in knee. Reports that he is comfortable with his exercises but that he really believes that he will require more PT before he can return to work, as he is still having difficulty with functional tasks such as kneeling, crawling, and ladder climbing.    Pt will benefit from skilled therapeutic intervention in order to improve on the following deficits Decreased strength;Pain;Abnormal gait;Difficulty walking;Decreased activity tolerance;Impaired flexibility;Decreased endurance;Improper body mechanics;Decreased range of motion   Rehab Potential Good   PT Frequency 2x / week   PT Duration 8 weeks   PT Treatment/Interventions Therapeutic exercise;Patient/family education;Gait training;Manual techniques;Stair training   PT Next Visit Plan Focus of therapy to shift to functional strengthening for return to work and increasing kneeling toleranc on Rt knee. Per pain tolerance increase step height, lunges and introduce squatting.    PT Home Exercise Plan hamstring, hip flexor, quadriceps, and calf stretches. 20xeconds, multi directional.    Consulted and Agree with Plan of Care Patient        Problem List There are no active problems to display for this patient.   Nedra Hai PT, DPT [phone]  [facility] [address] [place], [place], [address] Phone:  [phone]   Fax:  [phone]

## 2015-03-28 ENCOUNTER — Encounter (HOSPITAL_COMMUNITY): Payer: BLUE CROSS/BLUE SHIELD | Admitting: Physical Therapy

## 2015-03-30 ENCOUNTER — Encounter (HOSPITAL_COMMUNITY): Payer: BLUE CROSS/BLUE SHIELD | Admitting: Physical Therapy

## 2015-04-02 ENCOUNTER — Encounter (HOSPITAL_COMMUNITY): Payer: BLUE CROSS/BLUE SHIELD | Admitting: Physical Therapy

## 2015-04-04 ENCOUNTER — Encounter (HOSPITAL_COMMUNITY): Payer: BLUE CROSS/BLUE SHIELD

## 2015-04-06 ENCOUNTER — Encounter (HOSPITAL_COMMUNITY): Payer: BLUE CROSS/BLUE SHIELD

## 2015-04-09 ENCOUNTER — Encounter (HOSPITAL_COMMUNITY): Payer: BLUE CROSS/BLUE SHIELD | Admitting: Physical Therapy

## 2015-04-10 ENCOUNTER — Ambulatory Visit (HOSPITAL_COMMUNITY): Payer: BLUE CROSS/BLUE SHIELD | Attending: Sports Medicine | Admitting: Physical Therapy

## 2015-04-10 DIAGNOSIS — Z96651 Presence of right artificial knee joint: Secondary | ICD-10-CM | POA: Insufficient documentation

## 2015-04-10 DIAGNOSIS — R262 Difficulty in walking, not elsewhere classified: Secondary | ICD-10-CM | POA: Insufficient documentation

## 2015-04-10 DIAGNOSIS — M6281 Muscle weakness (generalized): Secondary | ICD-10-CM | POA: Insufficient documentation

## 2015-04-10 DIAGNOSIS — R29898 Other symptoms and signs involving the musculoskeletal system: Secondary | ICD-10-CM

## 2015-04-10 DIAGNOSIS — Z471 Aftercare following joint replacement surgery: Secondary | ICD-10-CM | POA: Insufficient documentation

## 2015-04-10 DIAGNOSIS — M25561 Pain in right knee: Secondary | ICD-10-CM | POA: Diagnosis not present

## 2015-04-10 DIAGNOSIS — M25661 Stiffness of right knee, not elsewhere classified: Secondary | ICD-10-CM | POA: Insufficient documentation

## 2015-04-10 NOTE — Therapy (Signed)
[place] [facility] [address] [place], [place], [address] Phone: [phone]   Fax:  [phone]  Physical Therapy Treatment  Patient Details  Name: Ralph NedMichael T Wadlow MRN: [id number] Date of Birth: 06/26/1956 Referring Provider:  Lenis DickinsonWeller, Edward B., MD  Encounter Date: 04/10/2015      PT End of Session - 04/10/15 0958    Visit Number 20   Number of Visits 32   Date for PT Re-Evaluation 04/18/15   Authorization Type BCBS   Authorization Time Period 02/14/15 to 04/16/15; 03/21/15 to 05/21/15   Authorization - Visit Number 20   Authorization - Number of Visits 32   PT Start Time 0910   PT Stop Time 1005   PT Time Calculation (min) 55 min   Activity Tolerance Patient tolerated treatment well   Behavior During Therapy [facility] for tasks assessed/performed      No past medical history on file.  No past surgical history on file.  There were no vitals filed for this visit.  Visit Diagnosis:  Knee stiffness, right  Right leg weakness  Status post total right knee replacement  Difficulty walking      Subjective Assessment - 04/10/15 0912    Subjective Patient says he is scared about going back to work and wants to continue therapy. notes h primaily has difficuty with paina nd strength though he feels his motion is better. Patient notes pain is 2/10 at rest and increases to  5/10 after an hour of walking.     Currently in Pain? Yes   Pain Score 2    Pain Location Knee   Pain Orientation Right   Pain Descriptors / Indicators Dull;Sore            [facility] Adult PT Treatment/Exercise - 04/10/15 0001    Knee/Hip Exercises: Stretches   Active Hamstring Stretch Limitations 14 inch box, 3 way  10x 3"    Quad Stretch 1 rep;20 seconds   Quad Stretch Limitations prone with rope    Hip Flexor Stretch Limitations 12' box 10x 3 seconds 3 ways   Piriformis Stretch Limitations 10x 3 seconds seated 1 way   Knee/Hip Exercises: Standing   Forward Lunges  Limitations Lunge matrix common 5x each    Lateral Step Up Both;1 set;15 reps;Step Height: 6"   Lateral Step Up Limitations standard step emphasis on slow descent   Forward Step Up Both;1 set;15 reps;Step Height: 6"   Forward Step Up Limitations standard step emphasis on slow descent.    Step Down Both;1 set;10 reps   Step Down Limitations 4 inch box    Functional Squat Limitations Squat matrix 5x with 5lb dumbbells. no split stance positions due to fatigue.    Other Standing Knee Exercises tall Kneeling 3D hip Excursions 10x on airex pad    3D thoracic spine excursions 10x Bike 10 minutes minimal resistance         PT Short Term Goals - 03/23/15 1309    PT SHORT TERM GOAL #1   Title Patient will dmeonstrate full knee extension to 0 degrees t be able to fully extend knee to normalize gait mechanics.    Status Achieved   PT SHORT TERM GOAL #2   Title Patient will be able to flex knee to 110 degrees to squat to a chair   Status Achieved   PT SHORT TERM GOAL #3   Title Patient will be able to walk withtou a cain for 30minutes without fear of falling.  Status Achieved   PT SHORT TERM GOAL #4   Title Patiwent will be independent with HEP.   Status Achieved           PT Long Term Goals - 03/23/15 1310    PT LONG TERM GOAL #1   Title Patient will be able to flex knee to 120 degrees to squat to the floor   Status Achieved   PT LONG TERM GOAL #2   Title Patient will be able to walk without a cane for wit pain <2/10 on average.    Status On-going   PT LONG TERM GOAL #3   Title Patient will demosntrate 5/5/ Rt knee flexion/extension strength with ability to ambulate up and down stairs withtou UE support.    Status On-going               Plan - 04/10/15 0950    Clinical Impression Statement Despit e of complaint of pain and fatigue with prolonged activitiy patient able to tolerate exercise throghtou session for which this session focused on. With patient  returnign tomorrow today placed increased emphaiss on strengthening and tomorrow will place increased focus on mobility.. 3D thoracic spine excursions given to improve waling arm swing to decrease strain on LEs.  patient able to squat in multiple positions through depth was limited to no > than 90 degrees knee flexion .    PT Next Visit Plan continue focus on functional strengthening for return to work and increasing kneeling toleranc on Rt knee. Per pain tolerance increase step height, lunges and split stance squatting.         Problem List There are no active problems to display for this patient.  Jerilee Field PT DPT [phone]  [facility] [address] [place], [place], [address] Phone: [phone]   Fax:  [phone]

## 2015-04-11 ENCOUNTER — Ambulatory Visit (HOSPITAL_COMMUNITY): Payer: BLUE CROSS/BLUE SHIELD | Admitting: Physical Therapy

## 2015-04-11 DIAGNOSIS — M25661 Stiffness of right knee, not elsewhere classified: Secondary | ICD-10-CM

## 2015-04-11 DIAGNOSIS — Z471 Aftercare following joint replacement surgery: Secondary | ICD-10-CM | POA: Diagnosis not present

## 2015-04-11 DIAGNOSIS — R262 Difficulty in walking, not elsewhere classified: Secondary | ICD-10-CM

## 2015-04-11 DIAGNOSIS — R29898 Other symptoms and signs involving the musculoskeletal system: Secondary | ICD-10-CM

## 2015-04-11 DIAGNOSIS — Z96651 Presence of right artificial knee joint: Secondary | ICD-10-CM

## 2015-04-11 NOTE — Therapy (Signed)
[facility] [facility] [address] [place], [place], [address] Phone: [phone]   Fax:  [phone]  Physical Therapy Treatment  Patient Details  Name: Ralph Marquez MRN: [id number] Date of Birth: 05/17/1956 Referring Provider:  Lenis DickinsonWeller, Edward B., MD  Encounter Date: 04/11/2015      PT End of Session - 04/11/15 1306    Visit Number 21   Number of Visits 32   Date for PT Re-Evaluation 04/18/15   Authorization Type BCBS   Authorization Time Period 02/14/15 to 04/16/15; 03/21/15 to 05/21/15   Authorization - Visit Number 21   Authorization - Number of Visits 32   PT Start Time 1300   PT Stop Time 1345   PT Time Calculation (min) 45 min   Activity Tolerance Patient tolerated treatment well   Behavior During Therapy [facility] for tasks assessed/performed      No past medical history on file.  No past surgical history on file.  There were no vitals filed for this visit.  Visit Diagnosis:  Knee stiffness, right  Right leg weakness  Status post total right knee replacement  Difficulty walking      Subjective Assessment - 04/11/15 1303    Subjective patient states minimal soreness. notes minor pain of 2/10. and arrives with minor antalgic gait.    Currently in Pain? Yes   Pain Score 2    Pain Location Knee   Pain Orientation Right             [facility] Adult PT Treatment/Exercise - 04/11/15 0001    Knee/Hip Exercises: Stretches   Active Hamstring Stretch 2 reps;20 seconds   Active Hamstring Stretch Limitations 14 inch box, 3 way    Hip Flexor Stretch 2 reps;20 seconds   Hip Flexor Stretch Limitations 3 way to 14" box   Piriformis Stretch Limitations 10x 3 seconds seated 1 way   Knee/Hip Exercises: Aerobic   Stationary Bike 10 min at seat 6   Knee/Hip Exercises: Standing   Forward Lunges Limitations 10x static   Side Lunges Limitations 10x static   Step Down Both;1 set;10 reps   Step Down Limitations 4 inch box    Functional Squat  1 set;10 reps   Functional Squat Limitations with UE support for depth and glute loading.    Gait Training 2D walking hip excursions 3540ft 1 RT each , and tandem walking on balance (6) beams5 trips.    Other Standing Knee Exercises tall Kneeling 3D hip Excursions 10x on airex pad   Manual Therapy   Manual Therapy Myofascial release   Myofascial Release Rt quadriceps and patellar tendon                  PT Short Term Goals - 03/23/15 1309    PT SHORT TERM GOAL #1   Title Patient will dmeonstrate full knee extension to 0 degrees t be able to fully extend knee to normalize gait mechanics.    Status Achieved   PT SHORT TERM GOAL #2   Title Patient will be able to flex knee to 110 degrees to squat to a chair   Status Achieved   PT SHORT TERM GOAL #3   Title Patient will be able to walk withtou a cain for 30minutes without fear of falling.    Status Achieved   PT SHORT TERM GOAL #4   Title Patiwent will be independent with HEP.   Status Achieved  PT Long Term Goals - 03/23/15 1310    PT LONG TERM GOAL #1   Title Patient will be able to flex knee to 120 degrees to squat to the floor   Status Achieved   PT LONG TERM GOAL #2   Title Patient will be able to walk without a cane for 30minutes wit pain <2/10 on average.    Status On-going   PT LONG TERM GOAL #3   Title Patient will demosntrate 5/5/ Rt knee flexion/extension strength with ability to ambulate up and down stairs withtou UE support.    Status On-going               Plan - 04/11/15 1433    Clinical Impression Statement Session focused on decreasing Rt knee pain/soreness with progression of mobility and balance exercises to increase hip stability/strength. Patient noted decreased pain at end of session and able to ambulate with decreased antalgic gait. Patient finished on bike for ROM.    PT Next Visit Plan continue focus on functional strengthening for return to work and increasing kneeling  tolerance on Rt knee. Per pain tolerance increase step height, lunges and split stance squatting. Progress to backwards walking on balance beam.          Problem List There are no active problems to display for this patient.  Jerilee FieldCash Eeshan Verbrugge PT DPT [phone]  [facility] [address] [place], [place], [address] Phone: [phone]   Fax:  [phone]

## 2015-04-13 ENCOUNTER — Ambulatory Visit (HOSPITAL_COMMUNITY): Payer: BLUE CROSS/BLUE SHIELD

## 2015-04-13 DIAGNOSIS — Z471 Aftercare following joint replacement surgery: Secondary | ICD-10-CM | POA: Diagnosis not present

## 2015-04-13 DIAGNOSIS — Z96651 Presence of right artificial knee joint: Secondary | ICD-10-CM

## 2015-04-13 DIAGNOSIS — M25661 Stiffness of right knee, not elsewhere classified: Secondary | ICD-10-CM

## 2015-04-13 DIAGNOSIS — R29898 Other symptoms and signs involving the musculoskeletal system: Secondary | ICD-10-CM

## 2015-04-13 DIAGNOSIS — R262 Difficulty in walking, not elsewhere classified: Secondary | ICD-10-CM

## 2015-04-13 NOTE — Therapy (Signed)
[place] [facility] [address] [place], [place], [address] Phone: [phone]   Fax:  [phone]  Physical Therapy Treatment  Patient Details  Name: Ralph Marquez MRN: [id number] Date of Birth: 01/31/1956 Referring Provider:  Lenis DickinsonWeller, Edward B., MD  Encounter Date: 04/13/2015      PT End of Session - 04/13/15 1452    Visit Number 22   Number of Visits 32   Date for PT Re-Evaluation 04/18/15   Authorization Type BCBS   Authorization Time Period 02/14/15 to 04/16/15; 03/21/15 to 05/21/15   Authorization - Visit Number 22   Authorization - Number of Visits 32   PT Start Time 1430   PT Stop Time 1512   PT Time Calculation (min) 42 min   Activity Tolerance Patient tolerated treatment well   Behavior During Therapy [facility] for tasks assessed/performed      No past medical history on file.  No past surgical history on file.  There were no vitals filed for this visit.  Visit Diagnosis:  Knee stiffness, right  Right leg weakness  Status post total right knee replacement  Difficulty walking      Subjective Assessment - 04/13/15 1433    Subjective Went to [place] yesterday, doctor happy with progress.  Returns to [place] on 05/18/2015 and will released for RTW at that point.  Pain scale minor 2/10 with premedication   Currently in Pain? Yes   Pain Score 2    Pain Location Knee   Pain Orientation Right   Pain Descriptors / Indicators Dull;Aching              [facility] Adult PT Treatment/Exercise - 04/13/15 0001    Knee/Hip Exercises: Stretches   Active Hamstring Stretch 3 reps;30 seconds   Active Hamstring Stretch Limitations 14 inch box, 3 way    Hip Flexor Stretch 2 reps;20 seconds   Hip Flexor Stretch Limitations 3 way to 14" box   Piriformis Stretch Limitations 10x 3 seconds seated 1 way   Gastroc Stretch 3 reps;30 seconds   Gastroc Stretch Limitations slantboard , 3 way    Knee/Hip Exercises: Standing   Forward Lunges Limitations 10x  static   Side Lunges Both;1 set;10 reps   Side Lunges Limitations 10x static   Lateral Step Up Both;15 reps;Step Height: 6"   Forward Step Up Both;1 set;15 reps;Step Height: 6"   Step Down Both;1 set;10 reps;Step Height: 4"   Functional Squat 2 sets;10 reps   Functional Squat Limitations with UE support for depth and glute loading.    Lunge Walking - Round Trips 2RT 1st set back leg straight, 2nd kneeling position   Other Standing Knee Exercises tall Kneeling 3D hip Excursions 10x on airex pad   Other Standing Knee Exercises tall kneel walking on top of 2 a                  PT Short Term Goals - 04/13/15 1611    PT SHORT TERM GOAL #1   Title Patient will dmeonstrate full knee extension to 0 degrees t be able to fully extend knee to normalize gait mechanics.    Status Achieved   PT SHORT TERM GOAL #2   Title Patient will be able to flex knee to 110 degrees to squat to a chair   Status Achieved   PT SHORT TERM GOAL #3   Title Patient will be able to walk withtou a cain for 30minutes without fear of falling.  Status Achieved   PT SHORT TERM GOAL #4   Title Patiwent will be independent with HEP.   Status Achieved           PT Long Term Goals - 04/13/15 1611    PT LONG TERM GOAL #1   Title Patient will be able to flex knee to 120 degrees to squat to the floor   Status Achieved   PT LONG TERM GOAL #2   Title Patient will be able to walk without a cane for 30minutes wit pain <2/10 on average.    Status On-going   PT LONG TERM GOAL #3   Title Patient will demosntrate 5/5/ Rt knee flexion/extension strength with ability to ambulate up and down stairs withtou UE support.    Status On-going               Plan - 04/13/15 1453    Clinical Impression Statement Session focus on functional strengthening and mobilty exercises to reduce hip and knee soreness/tightness.  Added tall kneeling walking on foam for RTW acitivites.  Added retro walking on balance beam for hip  stabilty with min guard.  Pt continues to have difficulty descending stairs due to weak eccentric quad control.    PT Next Visit Plan continue focus on functional strengthening for return to work and increasing kneeling tolerance on Rt knee. Per pain tolerance increase step height, lunges and split stance squatting.         Problem List There are no active problems to display for this patient.  [address]Yesica Kemler, FarnerLPTA; [place]LMBT #[id number] [phone]  Juel BurrowCockerham, Effa Yarrow Jo 04/13/2015, 4:15 PM  [place] [facility] [address] [place], [place], [address] Phone: [phone]   Fax:  [phone]

## 2015-04-16 ENCOUNTER — Telehealth (HOSPITAL_COMMUNITY): Payer: Self-pay

## 2015-04-16 ENCOUNTER — Ambulatory Visit (HOSPITAL_COMMUNITY): Payer: BLUE CROSS/BLUE SHIELD | Admitting: Physical Therapy

## 2015-04-16 NOTE — Telephone Encounter (Signed)
Can't come on 5/12 has a procedure being done on Friday that he has to prep for

## 2015-04-17 ENCOUNTER — Ambulatory Visit (HOSPITAL_COMMUNITY): Payer: BLUE CROSS/BLUE SHIELD | Admitting: Physical Therapy

## 2015-04-17 DIAGNOSIS — R262 Difficulty in walking, not elsewhere classified: Secondary | ICD-10-CM

## 2015-04-17 DIAGNOSIS — M25661 Stiffness of right knee, not elsewhere classified: Secondary | ICD-10-CM

## 2015-04-17 DIAGNOSIS — R29898 Other symptoms and signs involving the musculoskeletal system: Secondary | ICD-10-CM

## 2015-04-17 DIAGNOSIS — Z96651 Presence of right artificial knee joint: Secondary | ICD-10-CM

## 2015-04-17 DIAGNOSIS — Z471 Aftercare following joint replacement surgery: Secondary | ICD-10-CM | POA: Diagnosis not present

## 2015-04-17 NOTE — Patient Instructions (Signed)
Bridge   Lie back, legs bent. Inhale, pressing hips up. Keeping ribs in, lengthen lower back. Exhale, rolling down along spine from top. Repeat 10 times. Do 3 sessions per day.  Copyright  VHI. All rights reserved.   Chair Push-Up   Lift buttocks off seat of chair by pushing down with arms. Repeat 10 times. Do 3 sessions per day.  http://gt2.exer.us/439   Copyright  VHI. All rights reserved.   ANKLE: Pumps   Point toes down, then up. 20 reps per set, 3 sets per day, 7 days per week   Copyright  VHI. All rights reserved.  

## 2015-04-17 NOTE — Therapy (Signed)
[facility] [facility] [address] [place], [place], [address] Phone: [phone]   Fax:  [phone]  Physical Therapy Treatment  Patient Details  Name: Ralph Marquez MRN: [id number] Date of Birth: 04/29/1956 Referring Provider:  Lenis DickinsonWeller, Edward B., MD  Encounter Date: 04/17/2015      PT End of Session - 04/17/15 1108    Visit Number 23   Number of Visits 32   Date for PT Re-Evaluation 04/18/15   Authorization Type BCBS   Authorization Time Period 02/14/15 to 04/16/15; 03/21/15 to 05/21/15   Authorization - Visit Number 23   Authorization - Number of Visits 32   PT Start Time 1017   PT Stop Time 1112   PT Time Calculation (min) 55 min   Activity Tolerance Patient tolerated treatment well   Behavior During Therapy [facility] for tasks assessed/performed      No past medical history on file.  No past surgical history on file.  There were no vitals filed for this visit.  Visit Diagnosis:  Knee stiffness, right  Right leg weakness  Status post total right knee replacement  Difficulty walking      Subjective Assessment - 04/17/15 1029    Subjective Patient notes improveemnt in symptoms and only minimal pain.    Currently in Pain? Yes   Pain Score 1    Pain Location Knee   Pain Orientation Right           [facility] Adult PT Treatment/Exercise - 04/17/15 0001    Knee/Hip Exercises: Stretches   Active Hamstring Stretch 3 reps;30 seconds   Active Hamstring Stretch Limitations 14 inch box, 3 way    Hip Flexor Stretch 2 reps;20 seconds   Hip Flexor Stretch Limitations 3 way to 14" box   Piriformis Stretch Limitations 10x 3 seconds seated 1 way   Gastroc Stretch 3 reps;30 seconds   Gastroc Stretch Limitations slantboard , 3 way    Knee/Hip Exercises: Standing   Forward Lunges Limitations 5x static and transverse plane lunges 5x common direction   Side Lunges Both;1 set;10 reps   Side Lunges Limitations 5x static   Lateral Step Up Both;15  reps;Step Height: 6"   Forward Step Up Both;1 set;15 reps;Step Height: 6"   Step Down Limitations 4 inch box    Functional Squat 1 set;10 reps   Functional Squat Limitations 20lb box squat feet neutral, wide and split stance   Gait Training tall kneel walking on top of 2 a   Other Standing Knee Exercises tall Kneeling 3D hip Excursions 10x on airex pad   Other Standing Knee Exercises 20lb dead lift at 12" off floor, then followed up with 2 sets of split stance dead lift 20lb            PT Short Term Goals - 04/17/15 1107    PT SHORT TERM GOAL #1   Title Patient will dmeonstrate full knee extension to 0 degrees t be able to fully extend knee to normalize gait mechanics.    Status Achieved   PT SHORT TERM GOAL #2   Title Patient will be able to flex knee to 110 degrees to squat to a chair   Status Achieved   PT SHORT TERM GOAL #3   Title Patient will be able to walk withtou a cain for 30minutes without fear of falling.    Status Achieved   PT SHORT TERM GOAL #4   Title Patiwent will be independent with HEP.   Status  Achieved           PT Long Term Goals - 04/17/15 1107    PT LONG TERM GOAL #1   Title Patient will be able to flex knee to 120 degrees to squat to the floor   Status Achieved   PT LONG TERM GOAL #2   Title Patient will be able to walk without a cane for 30minutes wit pain <2/10 on average.    Status On-going   PT LONG TERM GOAL #3   Title Patient will demosntrate 5/5/ Rt knee flexion/extension strength with ability to ambulate up and down stairs withtou UE support.    Status On-going               Plan - 04/17/15 1106    Clinical Impression Statement Session focus on functional strengthening and mobilty exercises to reduce hip and knee soreness/tightness. Added box dead lifts and squats with 20lb to increase lifting strengthening anticipation of return to work. noted excessive toe out during gait improved following piriformis stretch and squat walk  arounds.    PT Next Visit Plan continue focus on functional strengthening for return to work and increasing kneeling tolerance on Rt knee. Progress to 8" step ups and progress weight of box lifts per pain and fatigue in back/hips/knee progress to dynamic lunges        Problem List There are no active problems to display for this patient.  Jerilee FieldCash Lorelie Biermann PT DPT [phone]  [facility] [address] [place], [place], [address] Phone: [phone]   Fax:  [phone]

## 2015-04-18 ENCOUNTER — Ambulatory Visit (HOSPITAL_COMMUNITY): Payer: BLUE CROSS/BLUE SHIELD | Admitting: Physical Therapy

## 2015-04-18 DIAGNOSIS — R262 Difficulty in walking, not elsewhere classified: Secondary | ICD-10-CM

## 2015-04-18 DIAGNOSIS — Z96651 Presence of right artificial knee joint: Secondary | ICD-10-CM

## 2015-04-18 DIAGNOSIS — R29898 Other symptoms and signs involving the musculoskeletal system: Secondary | ICD-10-CM

## 2015-04-18 DIAGNOSIS — Z471 Aftercare following joint replacement surgery: Secondary | ICD-10-CM | POA: Diagnosis not present

## 2015-04-18 DIAGNOSIS — M25661 Stiffness of right knee, not elsewhere classified: Secondary | ICD-10-CM

## 2015-04-18 NOTE — Therapy (Signed)
[place] [facility] [address] [place], [place], [address] Phone: [phone]   Fax:  [phone]  Physical Therapy Treatment  Patient Details  Name: Ralph NedMichael T Albanese MRN: [id number] Date of Birth: 03/02/1956 Referring Provider:  Lenis DickinsonWeller, Edward B., MD  Encounter Date: 04/18/2015      PT End of Session - 04/18/15 1124    Visit Number 24   Number of Visits 32   Date for PT Re-Evaluation 04/18/15   Authorization Type BCBS   Authorization Time Period 02/14/15 to 04/16/15; 03/21/15 to 05/21/15   Authorization - Visit Number 24   Authorization - Number of Visits 32   PT Start Time 0850   PT Stop Time 0942   PT Time Calculation (min) 52 min   Activity Tolerance Patient tolerated treatment well   Behavior During Therapy [facility] for tasks assessed/performed      No past medical history on file.  No past surgical history on file.  There were no vitals filed for this visit.  Visit Diagnosis:  Knee stiffness, right  Right leg weakness  Status post total right knee replacement  Difficulty walking      Subjective Assessment - 04/18/15 1123    Subjective Pt comes today with c/o increased swelling distal quad and increased discomfort.  States he has 4/10 pain in this area.  Pt also with antalgic gait.    Currently in Pain? Yes   Pain Score 4    Pain Location Knee   Pain Orientation Right                         [facility] Adult PT Treatment/Exercise - 04/18/15 0852    Knee/Hip Exercises: Stretches   Active Hamstring Stretch 3 reps;30 seconds   Active Hamstring Stretch Limitations 14 inch box, 3 way    Quad Stretch 3 reps;30 seconds   Quad Stretch Limitations prone with rope    Hip Flexor Stretch 2 reps;30 seconds   Hip Flexor Stretch Limitations 3 way to 14" box   Gastroc Stretch 3 reps;30 seconds   Gastroc Stretch Limitations slantboard , 3 way    Knee/Hip Exercises: Aerobic   Stationary Bike nustep seat 9 LE only level 3  hills #3 10 minutes   Knee/Hip Exercises: Standing   Gait Training tall kneel walking on top of 2 balance beams 4RT   Manual Therapy   Manual Therapy Myofascial release   Myofascial Release Rt quadriceps and patellar tendon                  PT Short Term Goals - 04/17/15 1107    PT SHORT TERM GOAL #1   Title Patient will dmeonstrate full knee extension to 0 degrees t be able to fully extend knee to normalize gait mechanics.    Status Achieved   PT SHORT TERM GOAL #2   Title Patient will be able to flex knee to 110 degrees to squat to a chair   Status Achieved   PT SHORT TERM GOAL #3   Title Patient will be able to walk withtou a cain for 30minutes without fear of falling.    Status Achieved   PT SHORT TERM GOAL #4   Title Patiwent will be independent with HEP.   Status Achieved           PT Long Term Goals - 04/17/15 1107    PT LONG TERM GOAL #1   Title Patient will be able  to flex knee to 120 degrees to squat to the floor   Status Achieved   PT LONG TERM GOAL #2   Title Patient will be able to walk without a cane for 30minutes wit pain <2/10 on average.    Status On-going   PT LONG TERM GOAL #3   Title Patient will demosntrate 5/5/ Rt knee flexion/extension strength with ability to ambulate up and down stairs withtou UE support.    Status On-going               Plan - 04/18/15 1125    Clinical Impression Statement consulted with evaluating therapist regarding increase in symptoms and swelling.  Therapist recommended focus on ROM and completing manual to area to decrease swelling and discomfort.  Completed session on nustep.  Overall improvement noted at EOS with last antalgia and pain.    PT Next Visit Plan continue focus on functional strengthening for return to work and increasing kneeling tolerance on Rt knee. Progress to 8" step ups and progress weight of box lifts per pain and fatigue in back/hips/knee progress to dynamic lunges         Lurena Nidamy B  Frazier, PTA/CLT [phone] 04/18/2015, 11:27 AM  [place] [facility] [address] [place], [place], [address] Phone: [phone]   Fax:  [phone]

## 2015-04-23 ENCOUNTER — Ambulatory Visit (HOSPITAL_COMMUNITY): Payer: BLUE CROSS/BLUE SHIELD | Admitting: Physical Therapy

## 2015-04-23 DIAGNOSIS — M25661 Stiffness of right knee, not elsewhere classified: Secondary | ICD-10-CM

## 2015-04-23 DIAGNOSIS — R29898 Other symptoms and signs involving the musculoskeletal system: Secondary | ICD-10-CM

## 2015-04-23 DIAGNOSIS — Z96651 Presence of right artificial knee joint: Secondary | ICD-10-CM

## 2015-04-23 DIAGNOSIS — R262 Difficulty in walking, not elsewhere classified: Secondary | ICD-10-CM

## 2015-04-23 DIAGNOSIS — Z471 Aftercare following joint replacement surgery: Secondary | ICD-10-CM | POA: Diagnosis not present

## 2015-04-23 NOTE — Therapy (Signed)
[place] [facility] [address] [place], [place], [address] Phone: [phone]   Fax:  [phone]  Physical Therapy Treatment (Re-Assessment)  Patient Details  Name: Ralph Marquez MRN: [id number] Date of Birth: [birth year] Referring Provider:  Lenis Dickinson., MD  Encounter Date: 04/23/2015      PT End of Session - 04/23/15 0932    Visit Number 25   Number of Visits 32   Date for PT Re-Evaluation 05/21/15   Authorization Type BCBS   Authorization Time Period 02/14/15 to 04/16/15; 03/21/15 to 05/21/15   Authorization - Visit Number 25   Authorization - Number of Visits 32   PT Start Time 0832   PT Stop Time 0928   PT Time Calculation (min) 56 min   Activity Tolerance Patient tolerated treatment well   Behavior During Therapy [facility] for tasks assessed/performed      No past medical history on file.  No past surgical history on file.  There were no vitals filed for this visit.  Visit Diagnosis:  Knee stiffness, right  Right leg weakness  Status post total right knee replacement  Difficulty walking      Subjective Assessment - 04/23/15 0833    Subjective Patient arrives with approx 2/10 pain in his R knee, continues to have discomfort in distal quad with continued discomfort.    Pertinent History histor of Lt knee meniscus tear that uimproved following surgery. Rt knee Replacement due to failed meniscus repair on it. Patient had surgery 01/23/15. 2 weeks of HHPt which resulted in a good HEP and improving strength. Patient ambulates wiht a cain everywhere except for very short distances in home. Initial injury occureded durign a fall due to tripping in a hole.    How long can you sit comfortably? 5/16- 30 to 60 minutes    How long can you stand comfortably? 5/16- not limited but does experience some pain and edema at end of day    How long can you walk comfortably? 5/16- not limited but does experience some pain and edema at end of day     Patient Stated Goals to be able to get up off the floor, to be bale to walk wihout a cain, and to be able to hike. 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 Yes   Pain Score 2    Pain Location Knee   Pain Orientation Right            [facility] PT Assessment - 04/23/15 0001    Observation/Other Assessments   Observations Continue to note edema and muscle knotting distal quad    Focus on Therapeutic Outcomes (FOTO)  36% limited (was 55% limited)   AROM   Right Hip External Rotation  44   Right Hip Internal Rotation  36   Left Hip External Rotation  43   Left Hip Internal Rotation  34   Right Knee Extension 0   Right Knee Flexion 120   Strength   Right Hip Flexion 5/5   Right Hip Extension 4-/5   Right Hip ABduction 3+/5  pain limited   Left Hip Flexion 5/5   Left Hip Extension 4-/5   Left Hip ABduction 4-/5   Right Knee Flexion 4+/5   Right Knee Extension 5/5   Left Knee Flexion 4+/5   Left Knee Extension 4+/5   Right Ankle Dorsiflexion 5/5   Left Ankle Dorsiflexion 5/5   Ambulation/Gait   Gait  Comments Bilateral hip ER, trendelenburg positive, flexed at hjips, reduced L LE step length, reduced gait speed; on stairs continues to demonstrate significant eccentfic weakness in quads as well as knee instability on stair descent                      [facility] PT Treatment/Exercise - 04/23/15 0001    Knee/Hip Exercises: Stretches   Active Hamstring Stretch 3 reps;30 seconds   Active Hamstring Stretch Limitations 14 inch box, 3 way    Quad Stretch 2 reps;30 seconds   Quad Stretch Limitations prone with rope    ITB Stretch 1 rep;30 seconds   ITB Stretch Limitations R LE    Gastroc Stretch 3 reps;30 seconds   Gastroc Stretch Limitations slantboard , 3 way    Knee/Hip Exercises: Standing   Other Standing Knee Exercises 3D hip excursions split stance 1x10; 3D hip excursions in kneeling R leg 1x10   Other Standing Knee Exercises Standing  hip ABD with 3 second hold 1x15 bilaterally    Manual Therapy   Manual Therapy Soft tissue mobilization   Myofascial Release Rt quad                 PT Education - 04/23/15 0932    Education provided Yes   Education Details education regarding use of heat to assist in reducing muscle knot in quad at home    Person(s) Educated Patient   Methods Explanation   Comprehension Verbalized understanding          PT Short Term Goals - 04/23/15 0940    PT SHORT TERM GOAL #1   Title Patient will dmeonstrate full knee extension to 0 degrees t be able to fully extend knee to normalize gait mechanics.    Baseline 02/21/2015 -4-113   Time 4   Period Weeks   Status Achieved   PT SHORT TERM GOAL #2   Title Patient will be able to flex knee to 110 degrees to squat to a chair   Baseline 02/21/2015 -4-113   Time 4   Period Weeks   Status Achieved   PT SHORT TERM GOAL #3   Title Patient will be able to walk withtou a cain for 30minutes without fear of falling.    Baseline Ambulates no AD, able to walk around [employer] for an hour   Time 4   Period Weeks   Status Achieved   PT SHORT TERM GOAL #4   Title Patiwent will be independent with HEP.   Baseline Patient states that he does his best to adhere to HEP   Time 4   Period Weeks   Status Achieved           PT Long Term Goals - 04/23/15 0940    PT LONG TERM GOAL #1   Title Patient will be able to flex knee to 120 degrees to squat to the floor   Baseline 03/02/2015 4-120   Time 8   Period Weeks   Status Achieved   PT LONG TERM GOAL #2   Title Patient will be able to walk without a cane for 30minutes wit pain <2/10 on average.    Time 8   Period Weeks   Status Revised   PT LONG TERM GOAL #3   Title Patient will demosntrate 5/5/ Rt knee flexion/extension strength with ability to ambulate up and down stairs withtou UE support.    Time 8   Period Weeks   Status On-going  PT LONG TERM GOAL #4   Title Patient will  demonstrate the abilty to perform full depth squat with pain no more than 1/10 and will be able to perform functional work tasks in kneeling with pain no more than 2/10   Time 8   Period Weeks   Status New   PT LONG TERM GOAL #5   Title Patient will be able to crawl over various surfaces, including hard surfaces, with pain no more than 2/10   Time 8   Period Weeks   Status New               Plan - 04/23/15 0933    Clinical Impression Statement Re-assessment performed today. Patient continues to demonstrate some weakness in his R knee and proximal muscles, postural impairments, deviations in gait and with stair ascent/descent, R knee pain, edema and muscle knotting in R distal quad, reduced functional activity tolerance, and reduced ability to perform functional work tasks at this time. The patient states that  his main remaining concerns are being able to perform  functional work tasks such as crawling, kneeling, and getting up from hard surfaces due to the tenderness and pain remaining in his R knee; ascent/descent of stairs and ladders are also a potential concern. The  Patient will continue to benefit from skilled PT services, at 2x/week for approximately 4 more weeks, in order to address these impairments and to assist him in reaching an optimal level of function.    Pt will benefit from skilled therapeutic intervention in order to improve on the following deficits Decreased strength;Pain;Abnormal gait;Difficulty walking;Decreased activity tolerance;Impaired flexibility;Decreased endurance;Improper body mechanics;Decreased range of motion   Rehab Potential Good   PT Frequency 2x / week   PT Duration 8 weeks   PT Treatment/Interventions Therapeutic exercise;Patient/family education;Gait training;Manual techniques;Stair training   PT Next Visit Plan continue focus on functional strengthening for return to work and increasing kneeling tolerance on Rt knee. Progress to 8" step ups and  progress weight of box lifts per pain and fatigue in back/hips/knee progress to dynamic lunges   PT Home Exercise Plan hamstring, hip flexor, quadriceps, and calf stretches. 20xeconds, multi directional.    Consulted and Agree with Plan of Care Patient        Problem List There are no active problems to display for this patient.   Nedra HaiKristen Lathaniel Legate PT, DPT [phone]  [facility] [address] [place], [place], [address] Phone: [phone]   Fax:  [phone]

## 2015-04-25 ENCOUNTER — Ambulatory Visit (HOSPITAL_COMMUNITY): Payer: BLUE CROSS/BLUE SHIELD

## 2015-04-25 DIAGNOSIS — Z471 Aftercare following joint replacement surgery: Secondary | ICD-10-CM | POA: Diagnosis not present

## 2015-04-25 DIAGNOSIS — R262 Difficulty in walking, not elsewhere classified: Secondary | ICD-10-CM

## 2015-04-25 DIAGNOSIS — R29898 Other symptoms and signs involving the musculoskeletal system: Secondary | ICD-10-CM

## 2015-04-25 DIAGNOSIS — M25661 Stiffness of right knee, not elsewhere classified: Secondary | ICD-10-CM

## 2015-04-25 DIAGNOSIS — Z96651 Presence of right artificial knee joint: Secondary | ICD-10-CM

## 2015-04-25 NOTE — Therapy (Signed)
[facility] [address] [place], [place], [address] Phone: [phone]   Fax:  [phone]  Physical Therapy Treatment  Patient Details  Name: Ralph NedMichael T Burlison MRN: [id number] Date of Birth: 03/31/1956 Referring Provider:  Lenis DickinsonWeller, Edward B., MD  Encounter Date: 04/25/2015      PT End of Session - 04/25/15 1328    Visit Number 26   Number of Visits 32   Date for PT Re-Evaluation 05/21/15   Authorization Type BCBS   Authorization Time Period 02/14/15 to 04/16/15; 03/21/15 to 05/21/15   Authorization - Visit Number 26   Authorization - Number of Visits 32   PT Start Time 1300   Activity Tolerance Patient tolerated treatment well   Behavior During Therapy [facility] for tasks assessed/performed      No past medical history on file.  No past surgical history on file.  There were no vitals filed for this visit.  Visit Diagnosis:  Knee stiffness, right  Right leg weakness  Status post total right knee replacement  Difficulty walking      Subjective Assessment - 04/25/15 1311    Subjective Pt stated lateral knee pain scale stays around 2/10   Currently in Pain? Yes   Pain Score 2    Pain Location Knee   Pain Orientation Right;Lateral   Pain Descriptors / Indicators Constant;Aching            [facility] - 04/25/15 0001    Assessment   Medical Diagnosis Rt TKA   Onset Date 01/23/15   Next MD Visit Loistine ChanceEdward Weller, 05/18/2015              [facility] Adult PT Treatment/Exercise - 04/25/15 0001    Knee/Hip Exercises: Stretches   Active Hamstring Stretch 3 reps;30 seconds   Active Hamstring Stretch Limitations 14 inch box, 3 way    Quad Stretch 30 seconds;3 reps   Quad Stretch Limitations prone with rope    ITB Stretch 1 rep;30 seconds   ITB Stretch Limitations R LE    Gastroc Stretch 3 reps;30 seconds   Gastroc Stretch Limitations slantboard , 3 way    Knee/Hip Exercises: Standing   Forward Lunges Both;10 reps   Forward Lunges Limitations static   Side Lunges Both;1 set;10 reps   Side Lunges Limitations static   Lateral Step Up Right;10 reps   Lateral Step Up Limitations 7in step   Forward Step Up Right;10 reps;Hand Hold: 1   Forward Step Up Limitations 7in step   Step Down Both;10 reps;Hand Hold: 2   Step Down Limitations 7in step   Other Standing Knee Exercises 3D hip excursions split stance 1x10; 3D hip excursions in kneeling R leg 1x10   Other Standing Knee Exercises Sidestepping green tband   Manual Therapy   Manual Therapy Soft tissue mobilization   Myofascial Release Rt distal quad               PT Short Term Goals - 04/25/15 1328    PT SHORT TERM GOAL #1   Title Patient will dmeonstrate full knee extension to 0 degrees t be able to fully extend knee to normalize gait mechanics.    Status Achieved   PT SHORT TERM GOAL #2   Title Patient will be able to flex knee to 110 degrees to squat to a chair   Status Achieved   PT SHORT TERM GOAL #3   Title Patient will be able to walk withtou a cain for 30minutes  without fear of falling.    Status Achieved   PT SHORT TERM GOAL #4   Title Patiwent will be independent with HEP.   Status Achieved           PT Long Term Goals - 04/25/15 1328    PT LONG TERM GOAL #1   Title Patient will be able to flex knee to 120 degrees to squat to the floor   Status Achieved   PT LONG TERM GOAL #2   Title Patient will be able to walk without a cane for 30minutes wit pain <2/10 on average.    Status On-going   PT LONG TERM GOAL #3   Title Patient will demosntrate 5/5/ Rt knee flexion/extension strength with ability to ambulate up and down stairs withtou UE support.    Status On-going   PT LONG TERM GOAL #4   Title Patient will demonstrate the abilty to perform full depth squat with pain no more than 1/10 and will be able to perform functional work tasks in kneeling with pain no more than 2/10   Status On-going   PT LONG TERM GOAL #5   Title  Patient will be able to crawl over various surfaces, including hard surfaces, with pain no more than 2/10   Status On-going               Plan - 04/25/15 1329    Clinical Impression Statement Session focus on progressing functional strengthening, increased stair training height to 7in step with noted visible musculature fatigue due to weakness.  Continue static lunges on floor for gluteal and hamstring strengthening with therapist facilitaiton to improve form and technique.  Ended session with manual techniques to reduce pain on lateral aspect of Rt knee for pain control.     PT Next Visit Plan continue focus on functional strengthening for return to work and increasing kneeling tolerance on Rt knee. Progress to 8" step ups and progress weight of box lifts per pain and fatigue in back/hips/knee progress to dynamic lunges        Problem List There are no active problems to display for this patient.  [address]Casey Cockerham, Nocona HillsLPTA; Hawaii[place]MBT #[id number] [phone]  Juel BurrowCockerham, Casey Jo 04/25/2015, 1:49 PM  [place] [facility] [address] [place], [place], [address] Phone: [phone]   Fax:  [phone]

## 2015-04-27 ENCOUNTER — Ambulatory Visit (HOSPITAL_COMMUNITY): Payer: BLUE CROSS/BLUE SHIELD | Admitting: Physical Therapy

## 2015-04-27 DIAGNOSIS — Z471 Aftercare following joint replacement surgery: Secondary | ICD-10-CM | POA: Diagnosis not present

## 2015-04-27 DIAGNOSIS — R29898 Other symptoms and signs involving the musculoskeletal system: Secondary | ICD-10-CM

## 2015-04-27 DIAGNOSIS — R262 Difficulty in walking, not elsewhere classified: Secondary | ICD-10-CM

## 2015-04-27 DIAGNOSIS — M25661 Stiffness of right knee, not elsewhere classified: Secondary | ICD-10-CM

## 2015-04-27 DIAGNOSIS — Z96651 Presence of right artificial knee joint: Secondary | ICD-10-CM

## 2015-04-27 NOTE — Therapy (Signed)
[facility] [address] [place], [place], [address] Phone: [phone]   Fax:  [phone]  Physical Therapy Treatment  Patient Details  Name: Ralph NedMichael T Klecka MRN: [id number] Date of Birth: 03/31/1956 Referring Provider:  Lenis DickinsonWeller, Edward B., MD  Encounter Date: 04/27/2015      PT End of Session - 04/27/15 1418    Visit Number 27   Number of Visits 32   Date for PT Re-Evaluation 05/21/15   Authorization Type BCBS   Authorization Time Period 02/14/15 to 04/16/15; 03/21/15 to 05/21/15   Authorization - Visit Number 27   Authorization - Number of Visits 32   PT Start Time 1345   PT Stop Time 1430   PT Time Calculation (min) 45 min   Activity Tolerance Patient tolerated treatment well   Behavior During Therapy [facility] for tasks assessed/performed      No past medical history on file.  No past surgical history on file.  There were no vitals filed for this visit.  Visit Diagnosis:  Knee stiffness, right  Right leg weakness  Status post total right knee replacement  Difficulty walking      Subjective Assessment - 04/27/15 1347    Subjective Pt stated lateral knee pain scale stays around 2-3/10   Currently in Pain? Yes   Pain Score 2    Pain Location Knee   Pain Orientation Right;Lateral   Pain Descriptors / Indicators Aching;Sore            [facility] PT Assessment - 04/27/15 0001    Assessment   Medical Diagnosis Rt TKA   Onset Date 01/23/15   Next MD Visit Loistine ChanceEdward Weller, 05/18/2015                     [facility] Adult PT Treatment/Exercise - 04/27/15 0001    Knee/Hip Exercises: Stretches   Active Hamstring Stretch 3 reps;30 seconds   Active Hamstring Stretch Limitations 14 inch box, 3 way    LobbyistQuad Stretch 30 seconds;3 reps   Quad Stretch Limitations prone with rope    Gastroc Stretch 3 reps;30 seconds   Gastroc Stretch Limitations slantboard , 3 way    Knee/Hip Exercises: Aerobic   Stationary Bike Stationary  bike 10minutes   Knee/Hip Exercises: Standing   Forward Lunges Both;10 reps   Forward Lunges Limitations static with 3 way reach   Functional Squat Limitations Squat reach matrix 5x each   Other Standing Knee Exercises 3D hip excursions split stance 1x10; 3D hip excursions in kneeling bilateral 1x10   Other Standing Knee Exercises Sidestepping green tband   Manual Therapy   Manual Therapy Soft tissue mobilization   Myofascial Release Rt distal quad                   PT Short Term Goals - 04/27/15 1348    PT SHORT TERM GOAL #1   Title Patient will dmeonstrate full knee extension to 0 degrees t be able to fully extend knee to normalize gait mechanics.    Status Achieved   PT SHORT TERM GOAL #2   Title Patient will be able to flex knee to 110 degrees to squat to a chair   Status Achieved   PT SHORT TERM GOAL #3   Title Patient will be able to walk withtou a cain for 30minutes without fear of falling.    Status Achieved   PT SHORT TERM GOAL #4   Title Patiwent will be independent with  HEP.   Status Achieved           PT Long Term Goals - 04/27/15 1349    PT LONG TERM GOAL #1   Title Patient will be able to flex knee to 120 degrees to squat to the floor   Status Achieved   PT LONG TERM GOAL #2   Title Patient will be able to walk without a cane for 30minutes wit pain <2/10 on average.    Status On-going   PT LONG TERM GOAL #3   Title Patient will demosntrate 5/5/ Rt knee flexion/extension strength with ability to ambulate up and down stairs withtou UE support.    Status On-going   PT LONG TERM GOAL #4   Title Patient will demonstrate the abilty to perform full depth squat with pain no more than 1/10 and will be able to perform functional work tasks in kneeling with pain no more than 2/10   Status On-going   PT LONG TERM GOAL #5   Title Patient will be able to crawl over various surfaces, including hard surfaces, with pain no more than 2/10   Status On-going                Plan - 04/27/15 1419    Clinical Impression Statement Patient dispalsy Rt anketior lateral knee pain superior to patella secondary to sevre Rt knee stiffness of quadriceps muscle. manual therapy peformed to increase knee flexion/extenion with patient noting continued pain following. Patient dicplays limited Rt tibia internal rtotation and excessive varus to valgus moment durign gait placign increased strain on lateral quadriceps muscle.    PT Next Visit Plan continue focus on functional strengthening for return to work and increasing kneeling tolerance on Rt knee. Progress to 8" step ups and progress weight of box lifts per pain and fatigue in back/hips/knee progress to dynamic lunges. Continue to focus manual soft tissue mobilization on Rt lateral quadriceps         Problem List There are no active problems to display for this patient.  Jerilee FieldCash Tevyn Codd PT DPT [phone]  [facility] [address] [place], [place], [address] Phone: [phone]   Fax:  [phone]

## 2015-04-30 ENCOUNTER — Ambulatory Visit (HOSPITAL_COMMUNITY): Payer: BLUE CROSS/BLUE SHIELD | Admitting: Physical Therapy

## 2015-04-30 DIAGNOSIS — Z471 Aftercare following joint replacement surgery: Secondary | ICD-10-CM | POA: Diagnosis not present

## 2015-04-30 DIAGNOSIS — Z96651 Presence of right artificial knee joint: Secondary | ICD-10-CM

## 2015-04-30 DIAGNOSIS — M25661 Stiffness of right knee, not elsewhere classified: Secondary | ICD-10-CM

## 2015-04-30 DIAGNOSIS — R29898 Other symptoms and signs involving the musculoskeletal system: Secondary | ICD-10-CM

## 2015-04-30 DIAGNOSIS — R262 Difficulty in walking, not elsewhere classified: Secondary | ICD-10-CM

## 2015-04-30 NOTE — Therapy (Signed)
[facility] [address] [place], [place], [address] Phone: [phone]   Fax:  [phone]  Physical Therapy Treatment  Patient Details  Name: Ralph Marquez MRN: [id number] Date of Birth: January 27, 1956 Referring Provider:  Lenis Dickinson., MD  Encounter Date: 04/30/2015      PT End of Session - 04/30/15 1023    Visit Number 28   Number of Visits 32   Date for PT Re-Evaluation 05/21/15   Authorization Type BCBS   Authorization Time Period 02/14/15 to 04/16/15; 03/21/15 to 05/21/15   Authorization - Visit Number 28   Authorization - Number of Visits 32   PT Start Time 0845   PT Stop Time 0930   PT Time Calculation (min) 45 min   Activity Tolerance Patient tolerated treatment well;Patient limited by pain   Behavior During Therapy [facility] for tasks assessed/performed      No past medical history on file.  No past surgical history on file.  There were no vitals filed for this visit.  Visit Diagnosis:  Knee stiffness, right  Right leg weakness  Status post total right knee replacement  Difficulty walking      Subjective Assessment - 04/30/15 0847    Subjective Pt reporting 2-3/10 pain this morning. Pt reports taking weekend off from HEP at recommendation of PT last week, but spent time with family.    Pertinent History histor of Lt knee meniscus tear that uimproved following surgery. Rt knee Replacement due to failed meniscus repair on it. Patient had surgery 01/23/15. 2 weeks of HHPt which resulted in a good HEP and improving strength. Patient ambulates wiht a cain everywhere except for very short distances in home. Initial injury occureded durign a fall due to tripping in a hole.    Currently in Pain? Yes   Pain Score 2    Pain Location Knee   Pain Orientation Right;Lateral   Pain Descriptors / Indicators Aching;Sore                         [facility] Adult PT Treatment/Exercise - 04/30/15 0001    Knee/Hip  Exercises: Stretches   Active Hamstring Stretch 3 reps;30 seconds   Active Hamstring Stretch Limitations 14 inch box, 3 way    Quad Stretch 30 seconds;3 reps   Quad Stretch Limitations prone with rope    Piriformis Stretch Limitations 10x 3 seconds seated 1 way   Gastroc Stretch 3 reps;30 seconds   Gastroc Stretch Limitations slantboard , 3 way    Knee/Hip Exercises: Standing   Forward Step Up 10 reps;Hand Hold: 1;Both   Forward Step Up Limitations 8in step   Other Standing Knee Exercises Aneterior hip excursions half kneeling 1x10; 3D hip excursions in kneeling bilateral 1x10  RLE on airex pad, not well tolerated due to pain   Other Standing Knee Exercises 3D hip excursion in split stance; hip IR box walks 1x10 bilat. anterior split squat w/ knee to airex pad 1x10 bilat   bilat, A/P, Frontal, Transverse planes 1x10   Knee/Hip Exercises: Supine   Bridges Strengthening;Both;1 set;15 reps   Straight Leg Raises Strengthening;Both;15 reps;1 set   Knee/Hip Exercises: Sidelying   Hip ABduction Right;1 set;10 reps;Strengthening   Hip ABduction Limitations Tacltile cues for neutral posture   Knee/Hip Exercises: Prone   Hip Extension Strengthening;1 set;10 reps;Both   Hip Extension Limitations knee maintained in ext   Manual Therapy   Manual Therapy Soft tissue mobilization  Myofascial Release Rt distal quad                 PT Education - 04/30/15 1022    Education provided Yes   Education Details edcuation regarding use of heat to reduce muscle knot    Person(s) Educated Patient   Methods Explanation   Comprehension Verbalized understanding          PT Short Term Goals - 04/27/15 1348    PT SHORT TERM GOAL #1   Title Patient will dmeonstrate full knee extension to 0 degrees t be able to fully extend knee to normalize gait mechanics.    Status Achieved   PT SHORT TERM GOAL #2   Title Patient will be able to flex knee to 110 degrees to squat to a chair   Status Achieved    PT SHORT TERM GOAL #3   Title Patient will be able to walk withtou a cain for without fear of falling.    Status Achieved   PT SHORT TERM GOAL #4   Title Patiwent will be independent with HEP.   Status Achieved           PT Long Term Goals - 04/27/15 1349    PT LONG TERM GOAL #1   Title Patient will be able to flex knee to 120 degrees to squat to the floor   Status Achieved   PT LONG TERM GOAL #2   Title Patient will be able to walk without a cane for wit pain <2/10 on average.    Status On-going   PT LONG TERM GOAL #3   Title Patient will demosntrate 5/5/ Rt knee flexion/extension strength with ability to ambulate up and down stairs withtou UE support.    Status On-going   PT LONG TERM GOAL #4   Title Patient will demonstrate the abilty to perform full depth squat with pain no more than 1/10 and will be able to perform functional work tasks in kneeling with pain no more than 2/10   Status On-going   PT LONG TERM GOAL #5   Title Patient will be able to crawl over various surfaces, including hard surfaces, with pain no more than 2/10   Status On-going               Plan - 04/30/15 1023    Clinical Impression Statement patient continues to be limited by R anterior lateral knee pain secondary to R knee stiffness and apparent knotting of lateral quad musculature. Attemtped some activities in kneeling however patient was limited in kneeling tasks due to pain today. Patient arrived with 2-3/10 pain which did not change throughout session.    Pt will benefit from skilled therapeutic intervention in order to improve on the following deficits Decreased strength;Pain;Abnormal gait;Difficulty walking;Decreased activity tolerance;Impaired flexibility;Decreased endurance;Improper body mechanics;Decreased range of motion   Rehab Potential Good   PT Frequency 2x / week   PT Duration 8 weeks   PT Treatment/Interventions Therapeutic exercise;Patient/family  education;Gait training;Manual techniques;Stair training   PT Next Visit Plan continue focus on functional strengthening for return to work and increasing kneeling tolerance on Rt knee. Progress to 8" step ups and progress weight of box lifts per pain and fatigue in back/hips/knee progress to dynamic lunges. Continue to focus manual soft tissue mobilization on Rt lateral quadriceps    PT Home Exercise Plan hamstring, hip flexor, quadriceps, and calf stretches. 20xeconds, multi directional.    Consulted and Agree with Plan of Care Patient  Problem List There are no active problems to display for this patient.  Nedra HaiKristen Unger PT, DPT [phone]  [facility] [address] [place], [place], [address] Phone: [phone]   Fax:  [phone]

## 2015-05-02 ENCOUNTER — Ambulatory Visit (HOSPITAL_COMMUNITY): Payer: BLUE CROSS/BLUE SHIELD | Admitting: Physical Therapy

## 2015-05-02 DIAGNOSIS — Z471 Aftercare following joint replacement surgery: Secondary | ICD-10-CM | POA: Diagnosis not present

## 2015-05-02 DIAGNOSIS — M25661 Stiffness of right knee, not elsewhere classified: Secondary | ICD-10-CM

## 2015-05-02 DIAGNOSIS — R262 Difficulty in walking, not elsewhere classified: Secondary | ICD-10-CM

## 2015-05-02 DIAGNOSIS — Z96651 Presence of right artificial knee joint: Secondary | ICD-10-CM

## 2015-05-02 DIAGNOSIS — R29898 Other symptoms and signs involving the musculoskeletal system: Secondary | ICD-10-CM

## 2015-05-02 NOTE — Therapy (Signed)
[facility] [address] [place], [place], [address] Phone: [phone]   Fax:  [phone]  Physical Therapy Treatment  Patient Details  Name: Ralph Marquez MRN: [id number] Date of Birth: Jun 10, 1956 Referring Provider:  Lenis Dickinson., MD  Encounter Date: 05/02/2015      PT End of Session - 05/02/15 1626    Visit Number 29   Number of Visits 32   Date for PT Re-Evaluation 05/21/15   Authorization Type BCBS   Authorization Time Period 02/14/15 to 04/16/15; 03/21/15 to 05/21/15   Authorization - Visit Number 29   Authorization - Number of Visits 32   PT Start Time 1300   PT Stop Time 1345   PT Time Calculation (min) 45 min   Activity Tolerance Patient tolerated treatment well;Patient limited by pain   Behavior During Therapy [facility] for tasks assessed/performed      No past medical history on file.  No past surgical history on file.  There were no vitals filed for this visit.  Visit Diagnosis:  Knee stiffness, right  Right leg weakness  Status post total right knee replacement  Difficulty walking          [facility] PT Assessment - 05/02/15 0001    Assessment   Medical Diagnosis Rt TKA   Onset Date/Surgical Date 01/23/15   Next MD Visit [name], 05/18/2015                     [facility] Adult PT Treatment/Exercise - 05/02/15 0001    Knee/Hip Exercises: Aerobic   Stationary Bike Nustep Level 3 X 10 minutes.   Knee/Hip Exercises: Standing   Knee Flexion Right;1 set;10 reps;AROM   Forward Lunges Limitations static with 3 way reach 5X per direction 2 sets   Lateral Step Up 5 reps;2 sets;Both;Step Height: 8"   Forward Step Up 10 reps;Hand Hold: 1;Both;Step Height: 8"   Functional Squat Limitations Squat reach matrix 5x each   Gait Training active warrmup; karioka, high knees, butt kicks, side steps, mini lunge - each 50 ft. X2.    Other Standing Knee Exercises split squat bend and reach bilateral 1set 5 reps  using 10 pounds, bosu ball squats in parallel bars with UE assist prn 1 set  10 reps   Other Standing Knee Exercises 3D hip excursion in split stance; hip IR box walks 1x10 bilat. anterior split squat w/ knee to airex pad 1x10 bilat   bilat, A/P, Frontal, Transverse planes 1x10   Manual Therapy   Manual Therapy Soft tissue mobilization  Rt quad in knee extension   Myofascial Release Rt distal quad                   PT Short Term Goals - 04/27/15 1348    PT SHORT TERM GOAL #1   Title Patient will dmeonstrate full knee extension to 0 degrees t be able to fully extend knee to normalize gait mechanics.    Status Achieved   PT SHORT TERM GOAL #2   Title Patient will be able to flex knee to 110 degrees to squat to a chair   Status Achieved   PT SHORT TERM GOAL #3   Title Patient will be able to walk withtou a cain for without fear of falling.    Status Achieved   PT SHORT TERM GOAL #4   Title Patiwent will be independent with HEP.   Status Achieved  PT Long Term Goals - 04/27/15 1349    PT LONG TERM GOAL #1   Title Patient will be able to flex knee to 120 degrees to squat to the floor   Status Achieved   PT LONG TERM GOAL #2   Title Patient will be able to walk without a cane for 30minutes wit pain <2/10 on average.    Status On-going   PT LONG TERM GOAL #3   Title Patient will demosntrate 5/5/ Rt knee flexion/extension strength with ability to ambulate up and down stairs withtou UE support.    Status On-going   PT LONG TERM GOAL #4   Title Patient will demonstrate the abilty to perform full depth squat with pain no more than 1/10 and will be able to perform functional work tasks in kneeling with pain no more than 2/10   Status On-going   PT LONG TERM GOAL #5   Title Patient will be able to crawl over various surfaces, including hard surfaces, with pain no more than 2/10   Status On-going               Plan - 05/02/15 1627    Clinical  Impression Statement Patient displays continued though improved Rt lateral quadriceps stiffness, with improving stiffness corerelatign to improved pain and symptoms. Patient notes that the knee brace he is wearing has decreased hs pain resulting in improved walking tolerance and endurance. Patient continues to display decreased activitity tolerance secondary to fatigue. FOcus of therapy to beon increasing repetitions to increase activitiy tolerance.    PT Next Visit Plan continue focus on functional strengthening for return to work and increasing kneeling tolerance on Rt knee. Progress to 8" step ups and progress weight of box lifts per pain and fatigue in back/hips/knee progress to dynamic lunges. Continue to focus manual soft tissue mobilization on Rt lateral quadriceps         Problem List There are no active problems to display for this patient.  Jerilee FieldCash Yuan Gann PT DPT [phone]  [facility] [address] [place], [place], [address] Phone: [phone]   Fax:  [phone]

## 2015-05-04 ENCOUNTER — Encounter (HOSPITAL_COMMUNITY): Payer: BLUE CROSS/BLUE SHIELD | Admitting: Physical Therapy

## 2015-05-08 ENCOUNTER — Encounter (HOSPITAL_COMMUNITY): Payer: BLUE CROSS/BLUE SHIELD | Admitting: Physical Therapy

## 2015-05-08 ENCOUNTER — Ambulatory Visit (HOSPITAL_COMMUNITY): Payer: BLUE CROSS/BLUE SHIELD | Admitting: Physical Therapy

## 2015-05-08 DIAGNOSIS — Z471 Aftercare following joint replacement surgery: Secondary | ICD-10-CM | POA: Diagnosis not present

## 2015-05-08 DIAGNOSIS — Z96651 Presence of right artificial knee joint: Secondary | ICD-10-CM

## 2015-05-08 DIAGNOSIS — M25661 Stiffness of right knee, not elsewhere classified: Secondary | ICD-10-CM

## 2015-05-08 DIAGNOSIS — R29898 Other symptoms and signs involving the musculoskeletal system: Secondary | ICD-10-CM

## 2015-05-08 NOTE — Therapy (Signed)
[facility] [address] [place], [place], [address] Phone: [phone]   Fax:  [phone]  Physical Therapy Treatment  Patient Details  Name: Ralph Marquez MRN: [id number] Date of Birth: 13-Jul-1956 Referring Provider:  Lenis Dickinson., MD  Encounter Date: 05/08/2015      PT End of Session - 05/08/15 1700    Visit Number 30   Number of Visits 32      No past medical history on file.  No past surgical history on file.  There were no vitals filed for this visit.  Visit Diagnosis:  Knee stiffness, right  Right leg weakness  Status post total right knee replacement      Subjective Assessment - 05/08/15 1311    Subjective Pain 2/10 today, in thigh muscle, generally doing pretty well today. Back to MD 05/18/15 maybe back to work 05/21/15.                          [facility] Adult PT Treatment/Exercise - 05/08/15 0001    Knee/Hip Exercises: Stretches   Active Hamstring Stretch 3 reps;30 seconds   Active Hamstring Stretch Limitations 14 inch box, 3 way    Quad Stretch 30 seconds;3 reps   Quad Stretch Limitations prone with rope, adding cues for increased hip extension   Hip Flexor Stretch 3 reps;30 seconds   Knee/Hip Exercises: Aerobic   Stationary Bike Nustep Level 3 X 7 minutes.   Elliptical     Knee/Hip Exercises: Standing   Lateral Step Up Right;3 sets;5 reps;Step Height: 4"  tactile cues for hipo abd for neutral alignment   Other Standing Knee Exercises Red band loop side steps bilateral 2 X 10 steps   Manual Therapy   Manual Therapy Soft tissue mobilization  Rt quad in knee extension   Myofascial Release Rt distal quad                 PT Education - 05/08/15 1700    Education provided Yes          PT Short Term Goals - 04/27/15 1348    PT SHORT TERM GOAL #1   Title Patient will dmeonstrate full knee extension to 0 degrees t be able to fully extend knee to normalize gait mechanics.    Status Achieved   PT SHORT TERM GOAL #2   Title Patient will be able to flex knee to 110 degrees to squat to a chair   Status Achieved   PT SHORT TERM GOAL #3   Title Patient will be able to walk withtou a cain for without fear of falling.    Status Achieved   PT SHORT TERM GOAL #4   Title Patiwent will be independent with HEP.   Status Achieved           PT Long Term Goals - 04/27/15 1349    PT LONG TERM GOAL #1   Title Patient will be able to flex knee to 120 degrees to squat to the floor   Status Achieved   PT LONG TERM GOAL #2   Title Patient will be able to walk without a cane for wit pain <2/10 on average.    Status On-going   PT LONG TERM GOAL #3   Title Patient will demosntrate 5/5/ Rt knee flexion/extension strength with ability to ambulate up and down stairs withtou UE support.    Status On-going   PT LONG TERM GOAL #  4   Title Patient will demonstrate the abilty to perform full depth squat with pain no more than 1/10 and will be able to perform functional work tasks in kneeling with pain no more than 2/10   Status On-going   PT LONG TERM GOAL #5   Title Patient will be able to crawl over various surfaces, including hard surfaces, with pain no more than 2/10   Status On-going               Plan - 05/08/15 1431    Clinical Impression Statement Patient complaining of ongoing distal lateral quad pain. Attempting to isolate area for treatment to allow for improved activity tolerance. Also ongoing strength deficits which are being addressed via exercise program.    Pt will benefit from skilled therapeutic intervention in order to improve on the following deficits Decreased strength;Pain;Abnormal gait;Difficulty walking;Decreased activity tolerance;Impaired flexibility;Decreased endurance;Improper body mechanics;Decreased range of motion   Rehab Potential Good   PT Frequency 2x / week   PT Duration 8 weeks   PT Treatment/Interventions Therapeutic  exercise;Patient/family education;Gait training;Manual techniques;Stair training   PT Next Visit Plan continue to work on strength through Rt quadriceps musculature for stability with activity. Soft tissue work as needed for decreasing pain and increasing tissue response to exercise. Advace strengthening with closed chain exercises.    PT Home Exercise Plan strengtheing and quad stretch as appropriate, assess hip extension effects.    Consulted and Agree with Plan of Care Patient        Problem List There are no active problems to display for this patient.   Delton SeeBenjamin Matsuko Kretz, PT, CSCS 05/08/2015, 5:03 PM  [place] [facility] [address] [place], [place], [address] Phone: [phone]   Fax:  [phone]

## 2015-05-09 ENCOUNTER — Encounter (HOSPITAL_COMMUNITY): Payer: BLUE CROSS/BLUE SHIELD | Admitting: Physical Therapy

## 2015-05-10 ENCOUNTER — Ambulatory Visit (HOSPITAL_COMMUNITY): Payer: BLUE CROSS/BLUE SHIELD | Attending: Sports Medicine | Admitting: Physical Therapy

## 2015-05-10 DIAGNOSIS — M25661 Stiffness of right knee, not elsewhere classified: Secondary | ICD-10-CM | POA: Insufficient documentation

## 2015-05-10 DIAGNOSIS — Z471 Aftercare following joint replacement surgery: Secondary | ICD-10-CM | POA: Insufficient documentation

## 2015-05-10 DIAGNOSIS — M25561 Pain in right knee: Secondary | ICD-10-CM | POA: Insufficient documentation

## 2015-05-10 DIAGNOSIS — Z96651 Presence of right artificial knee joint: Secondary | ICD-10-CM | POA: Diagnosis not present

## 2015-05-10 DIAGNOSIS — R29898 Other symptoms and signs involving the musculoskeletal system: Secondary | ICD-10-CM

## 2015-05-10 DIAGNOSIS — R262 Difficulty in walking, not elsewhere classified: Secondary | ICD-10-CM | POA: Insufficient documentation

## 2015-05-10 DIAGNOSIS — M6281 Muscle weakness (generalized): Secondary | ICD-10-CM | POA: Insufficient documentation

## 2015-05-10 NOTE — Therapy (Signed)
[facility] [address] [place], [place], [address] Phone: [phone]   Fax:  [phone]  Physical Therapy Treatment  Patient Details  Name: Ralph Marquez MRN: [id number] Date of Birth: [birth year] Referring Provider:  Lenis Dickinson., MD  Encounter Date: 05/10/2015      PT End of Session - 05/10/15 1341    Visit Number 31   Number of Visits 32   Date for PT Re-Evaluation 05/21/15   Authorization Type BCBS   Authorization Time Period 02/14/15 to 04/16/15; 03/21/15 to 05/21/15   Authorization - Visit Number 31   Authorization - Number of Visits 32   PT Start Time 1255   PT Stop Time 1340   PT Time Calculation (min) 45 min   Activity Tolerance Patient tolerated treatment well   Behavior During Therapy [facility] for tasks assessed/performed      No past medical history on file.  No past surgical history on file.  There were no vitals filed for this visit.  Visit Diagnosis:  Knee stiffness, right  Right leg weakness  Status post total right knee replacement  Difficulty walking      Subjective Assessment - 05/10/15 1259    Subjective Really just the same, knee hurts in the same area and hasn't changed. Doing some cooking, cleaning and walking the dog.    Pain Score 2    Pain Location Knee   Pain Orientation Right   Pain Descriptors / Indicators Aching;Sore                         [facility] Adult PT Treatment/Exercise - 05/10/15 0001    Knee/Hip Exercises: Stretches   Active Hamstring Stretch 3 reps;30 seconds   Active Hamstring Stretch Limitations 14 inch box, 3 way    Quad Stretch 30 seconds;3 reps   Quad Stretch Limitations prone with rope, adding cues for increased hip extension   Hip Flexor Stretch 3 reps;30 seconds   Knee/Hip Exercises: Aerobic   Stationary Bike Nustep Level 4 X 7 minutes.   Knee/Hip Exercises: Standing   Lateral Step Up Right;1 set;10 reps;Step Height: 4"  tactile cues   Functional  Squat 1 set;10 reps  cues for even weight bearing   Other Standing Knee Exercises dynamic stork, karioka bilateral, midline crossover each X25 feet   Knee/Hip Exercises: Seated   Long Arc Quad 2 sets;Right;10 reps  mod(-) resistance manual   Other Seated Knee Exercises knee flexion with red band resist 2 X 10   Knee/Hip Exercises: Supine   Heel Slides Limitations knee flexion 122 degrees                PT Education - 05/10/15 1341    Education provided Yes   Education Details continue with exercises as tolerated at home.    Person(s) Educated Patient   Methods Explanation   Comprehension Verbalized understanding          PT Short Term Goals - 04/27/15 1348    PT SHORT TERM GOAL #1   Title Patient will dmeonstrate full knee extension to 0 degrees t be able to fully extend knee to normalize gait mechanics.    Status Achieved   PT SHORT TERM GOAL #2   Title Patient will be able to flex knee to 110 degrees to squat to a chair   Status Achieved   PT SHORT TERM GOAL #3   Title Patient will be able to walk withtou  a cain for 30minutes without fear of falling.    Status Achieved   PT SHORT TERM GOAL #4   Title Patiwent will be independent with HEP.   Status Achieved           PT Long Term Goals - 04/27/15 1349    PT LONG TERM GOAL #1   Title Patient will be able to flex knee to 120 degrees to squat to the floor   Status Achieved   PT LONG TERM GOAL #2   Title Patient will be able to walk without a cane for 30minutes wit pain <2/10 on average.    Status On-going   PT LONG TERM GOAL #3   Title Patient will demosntrate 5/5/ Rt knee flexion/extension strength with ability to ambulate up and down stairs withtou UE support.    Status On-going   PT LONG TERM GOAL #4   Title Patient will demonstrate the abilty to perform full depth squat with pain no more than 1/10 and will be able to perform functional work tasks in kneeling with pain no more than 2/10   Status On-going    PT LONG TERM GOAL #5   Title Patient will be able to crawl over various surfaces, including hard surfaces, with pain no more than 2/10   Status On-going               Plan - 05/10/15 1342    Clinical Impression Statement Patient with excellent knee range of motion into knee flexion and extension. Quad muscle remains slow to gain strength. Patient complains of pain at lateral distal quad musculature.    Pt will benefit from skilled therapeutic intervention in order to improve on the following deficits Decreased strength;Pain;Abnormal gait;Difficulty walking;Decreased activity tolerance;Impaired flexibility;Decreased endurance;Improper body mechanics   Rehab Potential Good   PT Frequency 2x / week   PT Next Visit Plan Continue with focus on strength and balance.    PT Home Exercise Plan Continue with current.    Consulted and Agree with Plan of Care Patient        Problem List There are no active problems to display for this patient.   Christiane HaBenjamin J. Marvel Mcphillips, PT, CSCS    05/10/2015, 1:47 PM  [facility] [address] [place], [place], [address] Phone: [phone]   Fax:  [phone]

## 2015-05-11 ENCOUNTER — Encounter (HOSPITAL_COMMUNITY): Payer: BLUE CROSS/BLUE SHIELD | Admitting: Physical Therapy

## 2015-05-11 ENCOUNTER — Ambulatory Visit (HOSPITAL_COMMUNITY): Payer: BLUE CROSS/BLUE SHIELD | Admitting: Physical Therapy

## 2015-05-11 DIAGNOSIS — R29898 Other symptoms and signs involving the musculoskeletal system: Secondary | ICD-10-CM

## 2015-05-11 DIAGNOSIS — Z96651 Presence of right artificial knee joint: Secondary | ICD-10-CM

## 2015-05-11 DIAGNOSIS — Z471 Aftercare following joint replacement surgery: Secondary | ICD-10-CM | POA: Diagnosis not present

## 2015-05-11 NOTE — Therapy (Signed)
[facility] [address] [place], [place], [address] Phone: [phone]   Fax:  [phone]  Physical Therapy Treatment  Patient Details  Name: Ralph Marquez MRN: [id number] Date of Birth: 09/27/1956 Referring Provider:  Lenis DickinsonWeller, Edward B., MD  Encounter Date: 05/11/2015      PT End of Session - 05/11/15 1330    Visit Number 32   Number of Visits 32   Date for PT Re-Evaluation 05/21/15   Authorization Type BCBS   Authorization Time Period 02/14/15 to 04/16/15; 03/21/15 to 05/21/15   Authorization - Visit Number 32   Authorization - Number of Visits 32   PT Start Time 1255   PT Stop Time 1325   PT Time Calculation (min) 30 min   Activity Tolerance Patient tolerated treatment well   Behavior [facility] Therapy [facility] for tasks assessed/performed      No past medical history on file.  No past surgical history on file.  There were no vitals filed for this visit.  Visit Diagnosis:  Right leg weakness  Status post total right knee replacement      Subjective Assessment - 05/11/15 1255    Subjective Just the same as yesterday, no better, no worse.   Pain Score 2    Pain Location Knee   Pain Orientation Right   Pain Descriptors / Indicators Aching;Sore                         [facility] Adult PT Treatment/Exercise - 05/11/15 0001    Ambulation/Gait   Ambulation/Gait Yes   Ambulation/Gait Assistance 7: Independent   Ambulation Distance (Feet) 500 Feet   Assistive device None   Gait Pattern Within Functional Limits   Knee/Hip Exercises: Stretches   Active Hamstring Stretch 30 seconds;2 reps   Active Hamstring Stretch Limitations 14 inch box, 3 way    Quad Stretch 30 seconds;3 reps   Quad Stretch Limitations prone with rope, adding cues for increased hip extension   Hip Flexor Stretch 3 reps;30 seconds   Knee/Hip Exercises: Aerobic   Stationary Bike Nustep Level 3 X 7 minutes.   Knee/Hip Exercises: Standing   Lateral  Step Up Right;1 set;10 reps;Step Height: 4"  tactile cues   Functional Squat 1 set;10 reps  cues for even weight bearing   Other Standing Knee Exercises dynamic stork, karioka bilateral, midline crossover each X25 feet   Knee/Hip Exercises: Seated   Long Arc Quad 2 sets;Right;10 reps  mod(-) resistance manual   Other Seated Knee Exercises knee flexion with red band resist 2 X 10                PT Education - 05/11/15 1328    Education provided Yes   Education Details Discussed monitoring activity as possible contributing factor to ongoing pain, encouraged decrease in activity over the weekend as trial of effects on knee.    Person(s) Educated Patient   Methods Explanation   Comprehension Verbalized understanding          PT Short Term Goals - 04/27/15 1348    PT SHORT TERM GOAL #1   Title Patient will dmeonstrate full knee extension to 0 degrees t be able to fully extend knee to normalize gait mechanics.    Status Achieved   PT SHORT TERM GOAL #2   Title Patient will be able to flex knee to 110 degrees to squat to a chair   Status Achieved   PT  SHORT TERM GOAL #3   Title Patient will be able to walk withtou a cain for without fear of falling.    Status Achieved   PT SHORT TERM GOAL #4   Title Patiwent will be independent with HEP.   Status Achieved           PT Long Term Goals - 04/27/15 1349    PT LONG TERM GOAL #1   Title Patient will be able to flex knee to 120 degrees to squat to the floor   Status Achieved   PT LONG TERM GOAL #2   Title Patient will be able to walk without a cane for wit pain <2/10 on average.    Status On-going   PT LONG TERM GOAL #3   Title Patient will demosntrate 5/5/ Rt knee flexion/extension strength with ability to ambulate up and down stairs withtou UE support.    Status On-going   PT LONG TERM GOAL #4   Title Patient will demonstrate the abilty to perform full depth squat with pain no more than 1/10 and  will be able to perform functional work tasks in kneeling with pain no more than 2/10   Status On-going   PT LONG TERM GOAL #5   Title Patient will be able to crawl over various surfaces, including hard surfaces, with pain no more than 2/10   Status On-going               Plan - 05/11/15 1334    Clinical Impression Statement Patient with reports on unchanged pain in Rt knee. Strength improved today and swelling at distal quadriceps musculatre also decreased. Discussed possible excessive activity and ongoing pain with patient, encouraging a lighter activity weekend to assess  knee pain and response to activity change. Patient reported that he has too much going on really.   Pt will benefit from skilled therapeutic intervention in order to improve on the following deficits Decreased strength;Pain   Rehab Potential Good   PT Treatment/Interventions Therapeutic activities;Therapeutic exercise   PT Next Visit Plan continue with strengthening program with anticipation of having followup with physician next week. Authorization noted to be through next week.    PT Home Exercise Plan Continue with current.    Consulted and Agree with Plan of Care Patient        Problem List There are no active problems to display for this patient.   Christiane Ha, PT, CSCS  05/11/2015, 1:39 PM  [facility] [address] [place], [place], [address] Phone: [phone]   Fax:  [phone]

## 2015-05-14 ENCOUNTER — Ambulatory Visit (HOSPITAL_COMMUNITY): Payer: BLUE CROSS/BLUE SHIELD | Admitting: Physical Therapy

## 2015-05-14 DIAGNOSIS — Z471 Aftercare following joint replacement surgery: Secondary | ICD-10-CM | POA: Diagnosis not present

## 2015-05-14 DIAGNOSIS — Z96651 Presence of right artificial knee joint: Secondary | ICD-10-CM

## 2015-05-14 DIAGNOSIS — M25661 Stiffness of right knee, not elsewhere classified: Secondary | ICD-10-CM

## 2015-05-14 DIAGNOSIS — R29898 Other symptoms and signs involving the musculoskeletal system: Secondary | ICD-10-CM

## 2015-05-14 DIAGNOSIS — R262 Difficulty in walking, not elsewhere classified: Secondary | ICD-10-CM

## 2015-05-14 NOTE — Therapy (Signed)
[facility] [address] [place], [name], [address] Phone: [phone]   Fax:  [phone]  Physical Therapy Treatment (Discharge Assessment)  Patient Details  Name: Ralph Marquez MRN: [id number] Date of Birth: 01-22-1956 Referring Provider:  Ellamae Sia., MD  Encounter Date: 05/14/2015      PT End of Session - 05/14/15 1200    Visit Number 33   Number of Visits 32   Authorization Type BCBS   Authorization Time Period 02/14/15 to 04/16/15; 03/21/15 to 05/21/15   Authorization - Visit Number 76   Authorization - Number of Visits 32   PT Start Time 1110   PT Stop Time 1153   PT Time Calculation (min) 43 min   Activity Tolerance Patient tolerated treatment well   Behavior During Therapy [facility] for tasks assessed/performed      No past medical history on file.  No past surgical history on file.  There were no vitals filed for this visit.  Visit Diagnosis:  Right leg weakness  Status post total right knee replacement  Knee stiffness, right  Difficulty walking      Subjective Assessment - 05/14/15 1125    Subjective Pt states most of his pain remains in the lateral knee at the "knot " area.  States hurts 2/10 even at rest.  Reports his ROM is great.   Pertinent History histor of Lt knee meniscus tear that uimproved following surgery. Rt knee Replacement due to failed meniscus repair on it. Patient had surgery 01/23/15. 2 weeks of HHPt which resulted in a good HEP and improving strength. Patient ambulates wiht a cain everywhere except for very short distances in home. Initial injury occureded durign a fall due to tripping in a hole.    How long can you sit comfortably? 6/6-  60 minutes with knee bent, 3-4 hours with knee straight out    How long can you stand comfortably? 6/6- not limited    How long can you walk comfortably? 6/6- not limited    Patient Stated Goals to be able to get up off the floor, to be bale to walk wihout a  cain, and to be able to hike. 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 Yes   Pain Score 2    Pain Location Knee            [facility] PT Assessment - 05/14/15 0001    Observation/Other Assessments   Focus on Therapeutic Outcomes (FOTO)  32% limited    AROM   Right Hip External Rotation  50   Right Hip Internal Rotation  36   Left Hip External Rotation  50   Left Hip Internal Rotation  40   Right Knee Extension 1   Right Knee Flexion 124   Strength   Right Hip Flexion 5/5   Right Hip Extension 4-/5   Right Hip ABduction 4-/5   Left Hip Flexion 5/5   Left Hip Extension 4-/5   Left Hip ABduction 4/5   Right Knee Flexion 4+/5   Right Knee Extension 5/5   Left Knee Flexion 4+/5   Left Knee Extension 4+/5   Right Ankle Dorsiflexion 5/5   Left Ankle Dorsiflexion 5/5                     [facility] Adult PT Treatment/Exercise - 05/14/15 0001    Knee/Hip Exercises: Stretches   Active Hamstring Stretch 30 seconds;2 reps  Active Hamstring Stretch Limitations 14 inch box, 3 way    Knee/Hip Exercises: Standing   Other Standing Knee Exercises Kneeling 3D hip excursions and kneel to stand from foam pad  1x10   Manual Therapy   Manual Therapy Soft tissue mobilization  Rt quad in knee extension   Myofascial Release Rt distal quad                 PT Education - 05/14/15 1159    Education provided Yes   Education Details progress with skilled PT services, advised to continue with HEP moving forward    Person(s) Educated Patient   Methods Explanation   Comprehension Verbalized understanding          PT Short Term Goals - 05/14/15 1132    PT SHORT TERM GOAL #1   Title Patient will dmeonstrate full knee extension to 0 degrees t be able to fully extend knee to normalize gait mechanics.    Time 4   Period Weeks   Status Achieved   PT SHORT TERM GOAL #2   Title Patient will be able to flex knee to 110 degrees to squat to a chair   Time 4    Period Weeks   Status Achieved   PT SHORT TERM GOAL #3   Title Patient will be able to walk withtou a cain for 44mnutes without fear of falling.    Time 4   Period Weeks   Status Achieved   PT SHORT TERM GOAL #4   Title Patiwent will be independent with HEP.   Time 4   Period Weeks   Status Achieved           PT Long Term Goals - 05/14/15 1133    PT LONG TERM GOAL #1   Title Patient will be able to flex knee to 120 degrees to squat to the floor   Time 8   Period Weeks   Status Achieved   PT LONG TERM GOAL #2   Title Patient will be able to walk without a cane for 362mutes wit pain <2/10 on average.    Baseline 6/6- around 5/10 during gait    Time 8   Period Weeks   Status On-going   PT LONG TERM GOAL #3   Title Patient will demosntrate 5/5/ Rt knee flexion/extension strength with ability to ambulate up and down stairs withtou UE support.    Time 8   Period Weeks   Status On-going   PT LONG TERM GOAL #4   Title Patient will demonstrate the abilty to perform full depth squat with pain no more than 1/10 and will be able to perform functional work tasks in kneeling with pain no more than 2/10   Time 8   Period Weeks   Status On-going   PT LONG TERM GOAL #5   Title Patient will be able to crawl over various surfaces, including hard surfaces, with pain no more than 2/10   Time 8   Period Weeks   Status On-going               Plan - 05/14/15 1201    Clinical Impression Statement Re-assessment performed today. Patient shows good improvement in terms of strength and knee ROM, however does continue to be limited by pain in his R knee likely due to continuing ongoing muscle knot/spasm in R quad. Patient states that he feels the pain from this knot/spasm is really his primary limiting factor at this point  and states he has spoken to his MD about the area already;  patient states that he was told that this area should resolve on its own with time. Patient is  appropriate for DC at this time, and was advised to continue with previously assisgned HEP as well as to continue with heat and massage to affected area of muscle in order to reduce pain in this region.    Pt will benefit from skilled therapeutic intervention in order to improve on the following deficits Decreased strength;Pain   Rehab Potential Good   PT Next Visit Plan DC today    PT Home Exercise Plan Continue with current.    Consulted and Agree with Plan of Care Patient        Problem List There are no active problems to display for this patient.   PHYSICAL THERAPY DISCHARGE SUMMARY  Visits from Start of Care: 33  Current functional level related to goals / functional outcomes: Patient has shown great improvements in ROM and strength, however continues to demonstrate pain R quad secondary to muscle spasm/knot and reduced functional task performance skills due to pain in this area. Patient states he has spoken with his MD about this and was told that this should resolve with time. Patient otherwise content with functional status, also states that once he returns to work he would have  A very difficult time consistently making sessions.   Remaining deficits: Pain R knee possibly due to muscle knot/spasm in R quad, difficulty with functional task performance due to this pain   Education / Equipment:  Advised to continue with HEP, also to continue manually massaging and applying heat to painful area of R quad  Plan: Patient agrees to discharge.  Patient goals were partially met. Patient is being discharged due to being pleased with the current functional level.  ?????       Deniece Ree PT, DPT [facility] [address] [place], [name], [address] Phone: [phone]   Fax:  [phone]

## 2015-05-16 ENCOUNTER — Encounter (HOSPITAL_COMMUNITY): Payer: BLUE CROSS/BLUE SHIELD

## 2015-05-18 ENCOUNTER — Encounter (HOSPITAL_COMMUNITY): Payer: BLUE CROSS/BLUE SHIELD

## 2015-09-07 ENCOUNTER — Ambulatory Visit (HOSPITAL_COMMUNITY)
Admission: RE | Admit: 2015-09-07 | Discharge: 2015-09-07 | Disposition: A | Discharge status: Home / Self Care | Payer: BLUE CROSS/BLUE SHIELD | Source: Ambulatory Visit | Attending: Internal Medicine | Admitting: Internal Medicine

## 2015-09-07 ENCOUNTER — Other Ambulatory Visit (HOSPITAL_COMMUNITY): Payer: Self-pay | Admitting: Internal Medicine

## 2015-09-07 DIAGNOSIS — R197 Diarrhea, unspecified: Secondary | ICD-10-CM | POA: Insufficient documentation

## 2015-09-07 DIAGNOSIS — R933 Abnormal findings on diagnostic imaging of other parts of digestive tract: Secondary | ICD-10-CM | POA: Insufficient documentation

## 2015-09-07 DIAGNOSIS — R1032 Left lower quadrant pain: Secondary | ICD-10-CM

## 2015-09-07 DIAGNOSIS — K760 Fatty (change of) liver, not elsewhere classified: Secondary | ICD-10-CM | POA: Diagnosis not present

## 2015-09-07 DIAGNOSIS — R109 Unspecified abdominal pain: Secondary | ICD-10-CM

## 2015-09-07 MED ORDER — IOHEXOL 300 MG/ML  SOLN
100.0000 mL | Freq: Once | INTRAMUSCULAR | Status: AC | PRN
  Administered 2015-09-07: 100 mL via INTRAVENOUS

## 2015-09-07 MED ORDER — IOHEXOL 300 MG/ML  SOLN
25.0000 mL | Freq: Once | INTRAMUSCULAR | Status: AC | PRN
  Administered 2015-09-07: 50 mL via ORAL

## 2015-10-17 NOTE — Telephone Encounter (Signed)
Called concerning apt date and time  Casey Cockerham, LPTA; CBIS [phone]  

## 2016-01-31 ENCOUNTER — Ambulatory Visit (HOSPITAL_COMMUNITY)
Admission: RE | Admit: 2016-01-31 | Discharge: 2016-01-31 | Disposition: A | Discharge status: Home / Self Care | Payer: BLUE CROSS/BLUE SHIELD | Source: Ambulatory Visit | Attending: Internal Medicine | Admitting: Internal Medicine

## 2016-01-31 ENCOUNTER — Other Ambulatory Visit (HOSPITAL_COMMUNITY): Payer: Self-pay | Admitting: Internal Medicine

## 2016-01-31 DIAGNOSIS — R319 Hematuria, unspecified: Secondary | ICD-10-CM | POA: Diagnosis present

## 2016-01-31 DIAGNOSIS — K769 Liver disease, unspecified: Secondary | ICD-10-CM

## 2016-01-31 DIAGNOSIS — N3943 Post-void dribbling: Secondary | ICD-10-CM | POA: Diagnosis not present

## 2016-01-31 DIAGNOSIS — K760 Fatty (change of) liver, not elsewhere classified: Secondary | ICD-10-CM | POA: Insufficient documentation

## 2016-03-21 ENCOUNTER — Encounter (HOSPITAL_COMMUNITY): Payer: Self-pay

## 2016-05-21 ENCOUNTER — Other Ambulatory Visit (HOSPITAL_COMMUNITY): Payer: Self-pay | Admitting: Internal Medicine

## 2016-05-21 DIAGNOSIS — K769 Liver disease, unspecified: Secondary | ICD-10-CM

## 2016-05-29 ENCOUNTER — Ambulatory Visit (HOSPITAL_COMMUNITY): Payer: BLUE CROSS/BLUE SHIELD

## 2017-04-22 DIAGNOSIS — Z96651 Presence of right artificial knee joint: Secondary | ICD-10-CM | POA: Diagnosis not present

## 2017-04-22 DIAGNOSIS — M25561 Pain in right knee: Secondary | ICD-10-CM | POA: Diagnosis not present

## 2017-05-23 DIAGNOSIS — R05 Cough: Secondary | ICD-10-CM | POA: Diagnosis not present

## 2017-05-23 DIAGNOSIS — G4700 Insomnia, unspecified: Secondary | ICD-10-CM | POA: Diagnosis not present

## 2017-05-23 DIAGNOSIS — J019 Acute sinusitis, unspecified: Secondary | ICD-10-CM | POA: Diagnosis not present

## 2017-07-28 ENCOUNTER — Emergency Department (HOSPITAL_COMMUNITY): Payer: Commercial Managed Care - PPO

## 2017-07-28 ENCOUNTER — Observation Stay (HOSPITAL_COMMUNITY)
Admission: EM | Admit: 2017-07-28 | Discharge: 2017-07-30 | Disposition: A | Discharge status: Home / Self Care | Payer: Commercial Managed Care - PPO | Attending: Internal Medicine | Admitting: Internal Medicine

## 2017-07-28 ENCOUNTER — Encounter (HOSPITAL_COMMUNITY): Payer: Self-pay | Admitting: Cardiology

## 2017-07-28 DIAGNOSIS — R0602 Shortness of breath: Secondary | ICD-10-CM | POA: Diagnosis not present

## 2017-07-28 DIAGNOSIS — Z8249 Family history of ischemic heart disease and other diseases of the circulatory system: Secondary | ICD-10-CM | POA: Diagnosis not present

## 2017-07-28 DIAGNOSIS — Z87891 Personal history of nicotine dependence: Secondary | ICD-10-CM | POA: Diagnosis not present

## 2017-07-28 DIAGNOSIS — R7303 Prediabetes: Secondary | ICD-10-CM | POA: Diagnosis not present

## 2017-07-28 DIAGNOSIS — R079 Chest pain, unspecified: Principal | ICD-10-CM | POA: Diagnosis present

## 2017-07-28 DIAGNOSIS — Z9114 Patient's other noncompliance with medication regimen: Secondary | ICD-10-CM | POA: Diagnosis not present

## 2017-07-28 DIAGNOSIS — Z888 Allergy status to other drugs, medicaments and biological substances status: Secondary | ICD-10-CM | POA: Diagnosis not present

## 2017-07-28 DIAGNOSIS — I208 Other forms of angina pectoris: Secondary | ICD-10-CM

## 2017-07-28 DIAGNOSIS — K2960 Other gastritis without bleeding: Secondary | ICD-10-CM | POA: Insufficient documentation

## 2017-07-28 DIAGNOSIS — I10 Essential (primary) hypertension: Secondary | ICD-10-CM | POA: Diagnosis not present

## 2017-07-28 DIAGNOSIS — K319 Disease of stomach and duodenum, unspecified: Secondary | ICD-10-CM | POA: Diagnosis not present

## 2017-07-28 DIAGNOSIS — K210 Gastro-esophageal reflux disease with esophagitis: Secondary | ICD-10-CM | POA: Insufficient documentation

## 2017-07-28 DIAGNOSIS — K269 Duodenal ulcer, unspecified as acute or chronic, without hemorrhage or perforation: Secondary | ICD-10-CM | POA: Diagnosis not present

## 2017-07-28 DIAGNOSIS — R1310 Dysphagia, unspecified: Secondary | ICD-10-CM | POA: Insufficient documentation

## 2017-07-28 DIAGNOSIS — Z96659 Presence of unspecified artificial knee joint: Secondary | ICD-10-CM | POA: Diagnosis not present

## 2017-07-28 DIAGNOSIS — Z823 Family history of stroke: Secondary | ICD-10-CM | POA: Diagnosis not present

## 2017-07-28 HISTORY — DX: Essential (primary) hypertension: I10

## 2017-07-28 LAB — BASIC METABOLIC PANEL
Anion gap: 9 (ref 5–15)
BUN: 21 mg/dL — AB (ref 6–20)
CALCIUM: 9.1 mg/dL (ref 8.9–10.3)
CHLORIDE: 104 mmol/L (ref 101–111)
CO2: 25 mmol/L (ref 22–32)
CREATININE: 0.92 mg/dL (ref 0.61–1.24)
GFR calc Af Amer: >60 mL/min (ref >60)
GFR calc non Af Amer: >60 mL/min (ref >60)
GLUCOSE: 105 mg/dL — AB (ref 65–99)
Potassium: 3.7 mmol/L (ref 3.5–5.1)
Sodium: 138 mmol/L (ref 135–145)

## 2017-07-28 LAB — CBC
HCT: 45.6 % (ref 39.0–52.0)
Hemoglobin: 15.4 g/dL (ref 13.0–17.0)
MCH: 30.9 pg (ref 26.0–34.0)
MCHC: 33.8 g/dL (ref 30.0–36.0)
MCV: 91.4 fL (ref 78.0–100.0)
PLATELETS: 315 10*3/uL (ref 150–400)
RBC: 4.99 MIL/uL (ref 4.22–5.81)
RDW: 13.4 % (ref 11.5–15.5)
WBC: 14.6 10*3/uL — ABNORMAL HIGH (ref 4.0–10.5)

## 2017-07-28 LAB — TROPONIN I
Troponin I: <0.03 ng/mL (ref <0.03)
Troponin I: <0.03 ng/mL (ref <0.03)
Troponin I: <0.03 ng/mL (ref <0.03)

## 2017-07-28 MED ORDER — NITROGLYCERIN 0.4 MG SL SUBL
0.4000 mg | SUBLINGUAL_TABLET | SUBLINGUAL | Status: DC | PRN
  Administered 2017-07-28 (×2): 0.4 mg via SUBLINGUAL
  Filled 2017-07-28: qty 1

## 2017-07-28 MED ORDER — TRIAZOLAM 0.125 MG PO TABS
0.2500 mg | ORAL_TABLET | Freq: Every evening | ORAL | Status: DC | PRN
  Administered 2017-07-28 – 2017-07-29 (×2): 0.25 mg via ORAL
  Filled 2017-07-28 (×2): qty 2

## 2017-07-28 MED ORDER — TRAMADOL HCL 50 MG PO TABS
50.0000 mg | ORAL_TABLET | Freq: Four times a day (QID) | ORAL | Status: DC | PRN
  Administered 2017-07-28 – 2017-07-30 (×3): 50 mg via ORAL
  Filled 2017-07-28 (×3): qty 1

## 2017-07-28 MED ORDER — GI COCKTAIL ~~LOC~~
30.0000 mL | Freq: Two times a day (BID) | ORAL | Status: DC | PRN
  Administered 2017-07-28: 30 mL via ORAL
  Filled 2017-07-28: qty 30

## 2017-07-28 MED ORDER — AMLODIPINE BESYLATE 5 MG PO TABS: 10.0000 mg | ORAL_TABLET | Freq: Every day | ORAL | Status: DC

## 2017-07-28 MED ORDER — ENOXAPARIN SODIUM 40 MG/0.4ML ~~LOC~~ SOLN
40.0000 mg | SUBCUTANEOUS | Status: DC
  Filled 2017-07-28: qty 0.4

## 2017-07-28 MED ORDER — PANTOPRAZOLE SODIUM 40 MG PO TBEC
40.0000 mg | DELAYED_RELEASE_TABLET | Freq: Every day | ORAL | Status: DC
  Administered 2017-07-28 – 2017-07-29 (×2): 40 mg via ORAL
  Filled 2017-07-28 (×2): qty 1

## 2017-07-28 MED ORDER — ASPIRIN 81 MG PO CHEW
81.0000 mg | CHEWABLE_TABLET | Freq: Every day | ORAL | Status: DC
  Administered 2017-07-28 – 2017-07-30 (×3): 81 mg via ORAL
  Filled 2017-07-28 (×3): qty 1

## 2017-07-28 NOTE — H&P (Signed)
History and Physical    Ralph Marquez ZOX:[id number] DOB: March 21, 1956 DOA: 07/28/2017   PCP: Pearson Grippe, MD    Patient coming from: home  Chief Complaint: Intermittent chest discomfort.  HPI: Ralph Marquez is a 61 y.o. male with medical history significant of poorly controlled hypertension, smoking in the past one pack every day till 3 years ago who presents to the ER today with a chief complaint of worsening chest discomfort substernal on and off for the last 2 weeks associated with dysphagia and painful swallowing , not associated with any nausea or vomiting or fevers or chills or cough or shortness of breath. He described the chest pain as pressure-like substernal severe in nature crushing without diaphoresis or shortness of breath. In our ER he was evaluated when he was found to be hypertensive , with a negative EKG and chest x-ray, was given nitroglycerin sublingual and I have been called to evaluate situation.       Review of Systems: As per HPI otherwise 10 point review of systems negative.   Past Medical History:  Diagnosis Date  . Hypertension     Past Surgical History:  Procedure Laterality Date  . bone spur shoulder    . REPLACEMENT TOTAL KNEE       reports that he has quit smoking. He has never used smokeless tobacco. He reports that he does not drink alcohol or use drugs.  Allergies  Allergen Reactions  . Iodine Rash    History reviewed. No pertinent family history. HTN   Prior to Admission medications   Medication Sig Start Date End Date Taking? Authorizing Provider  calcium carbonate (TUMS) 500 MG chewable tablet Chew 2 tablets by mouth 2 (two) times daily as needed for indigestion or heartburn (chest pain).   Yes [provider]  triazolam (HALCION) 0.25 MG tablet Take 0.25 mg by mouth at bedtime as needed for sleep.   Yes [provider]    Physical Exam: Vitals:   07/28/17 1200 07/28/17 1230 07/28/17 1300 07/28/17 1330  BP:  134/88 117/78 130/90 138/87  Pulse: 79 74 63 74  Resp: (!) 21 (!) 22 19 19   Temp:      TempSrc:      SpO2: 97% 96% 92% 92%  Weight:      Height:          Constitutional: NAD, calm, comfortable Vitals:   07/28/17 1200 07/28/17 1230 07/28/17 1300 07/28/17 1330  BP: 134/88 117/78 130/90 138/87  Pulse: 79 74 63 74  Resp: (!) 21 (!) 22 19 19   Temp:      TempSrc:      SpO2: 97% 96% 92% 92%  Weight:      Height:       Eyes: PERRL, lids and conjunctivae normal ENMT: Mucous membranes are moist. Posterior pharynx clear of any exudate or lesions.Normal dentition.  Neck: normal, supple, no masses, no thyromegaly Respiratory: clear to auscultation bilaterally, no wheezing, no crackles. Normal respiratory effort. No accessory muscle use.  Cardiovascular: Regular rate and rhythm, no murmurs / rubs / gallops. No extremity edema. 2+ pedal pulses. No carotid bruits.  Abdomen: no tenderness, no masses palpated. No hepatosplenomegaly. Bowel sounds positive.  Musculoskeletal: no clubbing / cyanosis. No joint deformity upper and lower extremities. Good ROM, no contractures. Normal muscle tone.  Skin: no rashes, lesions, ulcers. No induration Neurologic: CN 2-12 grossly intact. Sensation intact, DTR normal. Strength 5/5 in all 4.  Psychiatric: Normal judgment and insight. Alert and oriented  x 3. Normal mood.    Labs on Admission: I have personally reviewed following labs and imaging studies  CBC:  Recent Labs Lab 07/28/17 1114  WBC 14.6*  HGB 15.4  HCT 45.6  MCV 91.4  PLT 315   Basic Metabolic Panel:  Recent Labs Lab 07/28/17 1114  NA 138  K 3.7  CL 104  CO2 25  GLUCOSE 105*  BUN 21*  CREATININE 0.92  CALCIUM 9.1   GFR: Estimated Creatinine Clearance: 97.8 mL/min (by C-G formula based on SCr of 0.92 mg/dL). Liver Function Tests: No results for input(s): AST, ALT, ALKPHOS, BILITOT, PROT, ALBUMIN in the last 168 hours. No results for input(s): LIPASE, AMYLASE in the last 168  hours. No results for input(s): AMMONIA in the last 168 hours. Coagulation Profile: No results for input(s): INR, PROTIME in the last 168 hours. Cardiac Enzymes:  Recent Labs Lab 07/28/17 1114  TROPONINI <0.03   BNP (last 3 results) No results for input(s): PROBNP in the last 8760 hours. HbA1C: No results for input(s): HGBA1C in the last 72 hours. CBG: No results for input(s): GLUCAP in the last 168 hours. Lipid Profile: No results for input(s): CHOL, HDL, LDLCALC, TRIG, CHOLHDL, LDLDIRECT in the last 72 hours. Thyroid Function Tests: No results for input(s): TSH, T4TOTAL, FREET4, T3FREE, THYROIDAB in the last 72 hours. Anemia Panel: No results for input(s): VITAMINB12, FOLATE, FERRITIN, TIBC, IRON, RETICCTPCT in the last 72 hours. Urine analysis: No results found for: COLORURINE, APPEARANCEUR, LABSPEC, PHURINE, GLUCOSEU, HGBUR, BILIRUBINUR, KETONESUR, PROTEINUR, UROBILINOGEN, NITRITE, LEUKOCYTESUR Sepsis Labs: @LABRCNTIP (procalcitonin:4,lacticidven:4) )No results found for this or any previous visit (from the past 240 hour(s)).   Radiological Exams on Admission: Dg Chest 2 View  Result Date: 07/28/2017 CLINICAL DATA:  Chest pain.  Fatigue. EXAM: CHEST  2 VIEW COMPARISON:  01/29/2016. FINDINGS: Mediastinum and hilar structures normal. Low lung volumes. No focal infiltrate. No pleural effusion or pneumothorax. Bilateral pleural-parenchymal thickening consistent with scarring. Degenerative changes thoracic spine. IMPRESSION: No acute cardiopulmonary disease. Electronically Signed   By: Maisie Fus  Register   On: 07/28/2017 11:31    EKG: Independently reviewed.   Assessment/Plan   1-chest pain: Very difficult to differentiate between the GI reason behind his chest pain versus the cardiac reason, he describes dysphagia and painful swallowing for that reason I will ask our GI to comment , I will put him on PPI in the meantime  trend the troponin every 6 hours and ask for a cardiology  evaluation for possible stress test. 2-hypertension poorly controlled start him on Norvasc 10 mg daily.            DVT prophylaxis:  (Lovenox/  Code Status: (Full Family Communication: (none Disposition Plan: home  Consults called: GI/Cards.  Admission status:  obs    Efrain Sella MD [facility]   If 7PM-7AM, please contact night-coverage [email or web address] Password [email or web address]  07/28/2017, 2:15 PM

## 2017-07-28 NOTE — ED Notes (Signed)
ED Provider at bedside. 

## 2017-07-28 NOTE — ED Notes (Signed)
Patient states he does not want to take anymore nitro at this time.

## 2017-07-28 NOTE — ED Provider Notes (Signed)
[facility]PT Provider Note   CSN: [id number] Arrival date & time: 07/28/17  1101     History   Chief Complaint Chief Complaint  Patient presents with  . Chest Pain    HPI Ralph Marquez is a 61 y.o. male.  Patient states that last couple weeks he's been having chest pain off-and-on. The pain is worse when he is exerting himself. He states is coming more often and feels worse. Patient has a history of hypertension but has not been taking his medicine because of side effects   The history is provided by the patient.  Chest Pain   This is a new problem. The current episode started more than 1 week ago. The problem occurs daily. The problem has not changed since onset.The pain is associated with exertion. The pain is present in the substernal region. The pain is at a severity of 6/10. The pain is moderate. The quality of the pain is described as dull. The pain does not radiate. The symptoms are aggravated by exertion. Pertinent negatives include no abdominal pain, no back pain, no cough and no headaches. He has tried nothing for the symptoms. The treatment provided no relief. Risk factors: htn.  Pertinent negatives for past medical history include no seizures.    Past Medical History:  Diagnosis Date  . Hypertension     Patient Active Problem List   Diagnosis Date Noted  . Chest pain 07/28/2017    Past Surgical History:  Procedure Laterality Date  . bone spur shoulder    . REPLACEMENT TOTAL KNEE         Home Medications    Prior to Admission medications   Medication Sig Start Date End Date Taking? Authorizing Provider  calcium carbonate (TUMS) 500 MG chewable tablet Chew 2 tablets by mouth 2 (two) times daily as needed for indigestion or heartburn (chest pain).   Yes [provider]  triazolam (HALCION) 0.25 MG tablet Take 0.25 mg by mouth at bedtime as needed for sleep.   Yes [provider]    Family History History reviewed. No pertinent  family history.  Social History Social History  Substance Use Topics  . Smoking status: Former Games developer  . Smokeless tobacco: Never Used  . Alcohol use No     Allergies   Iodine   Review of Systems Review of Systems  Constitutional: Negative for appetite change and fatigue.  HENT: Negative for congestion, ear discharge and sinus pressure.   Eyes: Negative for discharge.  Respiratory: Negative for cough.   Cardiovascular: Positive for chest pain.  Gastrointestinal: Negative for abdominal pain and diarrhea.  Genitourinary: Negative for frequency and hematuria.  Musculoskeletal: Negative for back pain.  Skin: Negative for rash.  Neurological: Negative for seizures and headaches.  Psychiatric/Behavioral: Negative for hallucinations.     Physical Exam Updated Vital Signs BP 138/87   Pulse 74   Temp 98.3 F (36.8 C) (Oral)   Resp 19   Ht 5\' 10"  (1.778 m)   Wt 93 kg (205 lb)   SpO2 92%   BMI 29.41 kg/m   Physical Exam  Constitutional: He is oriented to person, place, and time. He appears well-developed.  HENT:  Head: Normocephalic.  Eyes: Conjunctivae and EOM are normal. No scleral icterus.  Neck: Neck supple. No thyromegaly present.  Cardiovascular: Normal rate and regular rhythm.  Exam reveals no gallop and no friction rub.   No murmur heard. Pulmonary/Chest: No stridor. He has no wheezes. He has no rales.  He exhibits no tenderness.  Abdominal: He exhibits no distension. There is no tenderness. There is no rebound.  Musculoskeletal: Normal range of motion. He exhibits no edema.  Lymphadenopathy:    He has no cervical adenopathy.  Neurological: He is oriented to person, place, and time. He exhibits normal muscle tone. Coordination normal.  Skin: No rash noted. No erythema.  Psychiatric: He has a normal mood and affect. His behavior is normal.     ED Treatments / Results  Labs (all labs ordered are listed, but only abnormal results are displayed) Labs Reviewed   BASIC METABOLIC PANEL - Abnormal; Notable for the following:       Result Value   Glucose, Bld 105 (*)    BUN 21 (*)    All other components within normal limits  CBC - Abnormal; Notable for the following:    WBC 14.6 (*)    All other components within normal limits  TROPONIN I  HIV ANTIBODY (ROUTINE TESTING)  CBC  CREATININE, SERUM  TROPONIN I  TROPONIN I  TROPONIN I    EKG  EKG Interpretation None       Radiology Dg Chest 2 View  Result Date: 07/28/2017 CLINICAL DATA:  Chest pain.  Fatigue. EXAM: CHEST  2 VIEW COMPARISON:  01/29/2016. FINDINGS: Mediastinum and hilar structures normal. Low lung volumes. No focal infiltrate. No pleural effusion or pneumothorax. Bilateral pleural-parenchymal thickening consistent with scarring. Degenerative changes thoracic spine. IMPRESSION: No acute cardiopulmonary disease. Electronically Signed   By: Maisie Fus  Register   On: 07/28/2017 11:31    Procedures Procedures (including critical care time)  Medications Ordered in ED Medications  nitroGLYCERIN (NITROSTAT) SL tablet 0.4 mg (0.4 mg Sublingual Given 07/28/17 1357)  triazolam (HALCION) tablet 0.25 mg (not administered)  enoxaparin (LOVENOX) injection 40 mg (not administered)  amLODipine (NORVASC) tablet 10 mg (not administered)     Initial Impression / Assessment and Plan / ED Course  I have reviewed the triage vital signs and the nursing notes.  Pertinent labs & imaging results that were available during my care of the patient were reviewed by me and considered in my medical decision making (see chart for details).     Patient with exertional chest pain. He will be admitted by medicine for further workup  Final Clinical Impressions(s) / ED Diagnoses   Final diagnoses:  None    New Prescriptions New Prescriptions   No medications on file     Bethann Berkshire, MD 07/28/17 1425

## 2017-07-28 NOTE — ED Triage Notes (Addendum)
Chest pain times one week.  Also c/o weakness.  States he is unable to take any blood pressure medicine due to side effects.

## 2017-07-29 ENCOUNTER — Observation Stay (HOSPITAL_BASED_OUTPATIENT_CLINIC_OR_DEPARTMENT_OTHER): Payer: Commercial Managed Care - PPO

## 2017-07-29 ENCOUNTER — Encounter (HOSPITAL_COMMUNITY): Payer: Self-pay | Admitting: Physician Assistant

## 2017-07-29 DIAGNOSIS — R079 Chest pain, unspecified: Secondary | ICD-10-CM

## 2017-07-29 DIAGNOSIS — Z87891 Personal history of nicotine dependence: Secondary | ICD-10-CM | POA: Diagnosis not present

## 2017-07-29 DIAGNOSIS — R1310 Dysphagia, unspecified: Secondary | ICD-10-CM

## 2017-07-29 DIAGNOSIS — Z8249 Family history of ischemic heart disease and other diseases of the circulatory system: Secondary | ICD-10-CM

## 2017-07-29 DIAGNOSIS — K219 Gastro-esophageal reflux disease without esophagitis: Secondary | ICD-10-CM | POA: Diagnosis not present

## 2017-07-29 DIAGNOSIS — I10 Essential (primary) hypertension: Secondary | ICD-10-CM

## 2017-07-29 DIAGNOSIS — K210 Gastro-esophageal reflux disease with esophagitis: Secondary | ICD-10-CM | POA: Diagnosis not present

## 2017-07-29 DIAGNOSIS — R9439 Abnormal result of other cardiovascular function study: Secondary | ICD-10-CM | POA: Diagnosis not present

## 2017-07-29 LAB — ECHOCARDIOGRAM COMPLETE
AVLVOTPG: 5 mmHg
CHL CUP DOP CALC LVOT VTI: 24.7 cm
CHL CUP MV DEC (S): 268
CHL CUP TV REG PEAK VELOCITY: 236 cm/s
EERAT: 6.07
EWDT: 268 ms
FS: 47 % — AB (ref 28–44)
Height: 70 in
IV/PV OW: 1.02
LA diam end sys: 36 mm
LA vol: 52.7 mL
LADIAMINDEX: 1.66 cm/m2
LASIZE: 36 mm
LAVOLA4C: 54.4 mL
LAVOLIN: 24.3 mL/m2
LDCA: 3.46 cm2
LV E/e' medial: 6.07
LV E/e'average: 6.07
LV PW d: 11.1 mm — AB (ref 0.6–1.1)
LV SIMPSON'S DISK: 73
LV TDI E'LATERAL: 10.9
LV TDI E'MEDIAL: 8.05
LV dias vol index: 36 mL/m2
LV dias vol: 79 mL (ref 62–150)
LV sys vol: 22 mL
LVELAT: 10.9 cm/s
LVOT SV: 85 mL
LVOTD: 21 mm
LVOTPV: 111 cm/s
LVSYSVOLIN: 10 mL/m2
MV pk E vel: 66.2 m/s
MVPKAVEL: 80.9 m/s
RV LATERAL S' VELOCITY: 11.4 cm/s
RV TAPSE: 23 mm
RV sys press: 25 mmHg
Stroke v: 57 ml
TR max vel: 236 cm/s
Weight: 3280 oz

## 2017-07-29 LAB — HEPATIC FUNCTION PANEL
ALBUMIN: 3.6 g/dL (ref 3.5–5.0)
ALK PHOS: 71 U/L (ref 38–126)
ALT: 35 U/L (ref 17–63)
AST: 23 U/L (ref 15–41)
BILIRUBIN INDIRECT: 0.2 mg/dL — AB (ref 0.3–0.9)
Bilirubin, Direct: 0.1 mg/dL (ref 0.1–0.5)
TOTAL PROTEIN: 7 g/dL (ref 6.5–8.1)
Total Bilirubin: 0.3 mg/dL (ref 0.3–1.2)

## 2017-07-29 LAB — TROPONIN I: Troponin I: <0.03 ng/mL (ref <0.03)

## 2017-07-29 LAB — HIV ANTIBODY (ROUTINE TESTING W REFLEX): HIV Screen 4th Generation wRfx: NONREACTIVE

## 2017-07-29 MED ORDER — PANTOPRAZOLE SODIUM 40 MG PO TBEC
40.0000 mg | DELAYED_RELEASE_TABLET | Freq: Two times a day (BID) | ORAL | Status: DC
  Administered 2017-07-30: 40 mg via ORAL
  Filled 2017-07-29: qty 1

## 2017-07-29 MED ORDER — ONDANSETRON HCL 4 MG/2ML IJ SOLN
4.0000 mg | Freq: Four times a day (QID) | INTRAMUSCULAR | Status: DC | PRN
  Administered 2017-07-29: 4 mg via INTRAVENOUS
  Filled 2017-07-29: qty 2

## 2017-07-29 NOTE — Progress Notes (Signed)
*  PRELIMINARY RESULTS* Echocardiogram 2D Echocardiogram has been performed.  Stacey Drain 07/29/2017, 10:28 AM

## 2017-07-29 NOTE — Consult Note (Signed)
Reason for Consult: Referring Physician:   [name] is an 61 y.o. male.  HPI: Admitted thru the ED yesterday with chest pain. Chest pain stopped after the NTG x 3.  Chest pain last approximately 8 hrs. He had dysphagia to solids and liquids with the chest pain.  Since pain relieved, he has not had any dysphagia.  Has had chest pain for about 4 months or so.  Troponin's x 4 are  negative.  Echo cardiogram showed EF of 65-70%. States no further dysphagia since chest pain relieved.  Denies GERD Hx of CAD in mother. Sister CVA.  Hx of hypertension, but but has not been taking due to side effects per records.   Past Medical History:  Diagnosis Date  . Hypertension     Past Surgical History:  Procedure Laterality Date  . bone spur shoulder    . REPLACEMENT TOTAL KNEE      Family History  Problem Relation Age of Onset  . CAD Mother        CABG in 30's  . CVA Sister     Social History:  reports that he has quit smoking. His smoking use included Cigarettes. He has never used smokeless tobacco. He reports that he does not drink alcohol or use drugs.  Allergies:  Allergies  Allergen Reactions  . Amlodipine Hives  . Lisinopril Hives  . Losartan Potassium Hives  . Iodine Rash    Medications: I have reviewed the patient's current medications.  Results for orders placed or performed during the hospital encounter of 07/28/17 (from the past 48 hour(s))  Basic metabolic panel     Status: Abnormal   Collection Time: 07/28/17 11:14 AM  Result Value Ref Range   Sodium 138 135 - 145 mmol/L   Potassium 3.7 3.5 - 5.1 mmol/L   Chloride 104 101 - 111 mmol/L   CO2 25 22 - 32 mmol/L   Glucose, Bld 105 (H) 65 - 99 mg/dL   BUN 21 (H) 6 - 20 mg/dL   Creatinine, Ser 0.92 0.61 - 1.24 mg/dL   Calcium 9.1 8.9 - 10.3 mg/dL   GFR calc non Af Amer >60 >60 mL/min   GFR calc Af Amer >60 >60 mL/min    Comment: (NOTE) The eGFR has been calculated using the CKD EPI equation. This  calculation has not been validated in all clinical situations. eGFR's persistently <60 mL/min signify possible Chronic Kidney Disease.    Anion gap 9 5 - 15  CBC     Status: Abnormal   Collection Time: 07/28/17 11:14 AM  Result Value Ref Range   WBC 14.6 (H) 4.0 - 10.5 K/uL   RBC 4.99 4.22 - 5.81 MIL/uL   Hemoglobin 15.4 13.0 - 17.0 g/dL   HCT 45.6 39.0 - 52.0 %   MCV 91.4 78.0 - 100.0 fL   MCH 30.9 26.0 - 34.0 pg   MCHC 33.8 30.0 - 36.0 g/dL   RDW 13.4 11.5 - 15.5 %   Platelets 315 150 - 400 K/uL  Troponin I     Status: None   Collection Time: 07/28/17 11:14 AM  Result Value Ref Range   Troponin I <0.03 <0.03 ng/mL  HIV antibody (Routine Testing)     Status: None   Collection Time: 07/28/17  2:41 PM  Result Value Ref Range   HIV Screen 4th Generation wRfx Non Reactive Non Reactive    Comment: (NOTE) Performed At: [facility] [address] [place], [name] [id number] Darrel Hoover  F MD QW:[id number]   Troponin I (q 6hr x 3)     Status: None   Collection Time: 07/28/17  2:41 PM  Result Value Ref Range   Troponin I <0.03 <0.03 ng/mL  Troponin I (q 6hr x 3)     Status: None   Collection Time: 07/28/17  8:34 PM  Result Value Ref Range   Troponin I <0.03 <0.03 ng/mL  Troponin I (q 6hr x 3)     Status: None   Collection Time: 07/29/17  2:14 AM  Result Value Ref Range   Troponin I <0.03 <0.03 ng/mL    Dg Chest 2 View  Result Date: 07/28/2017 CLINICAL DATA:  Chest pain.  Fatigue. EXAM: CHEST  2 VIEW COMPARISON:  01/29/2016. FINDINGS: Mediastinum and hilar structures normal. Low lung volumes. No focal infiltrate. No pleural effusion or pneumothorax. Bilateral pleural-parenchymal thickening consistent with scarring. Degenerative changes thoracic spine. IMPRESSION: No acute cardiopulmonary disease. Electronically Signed   By: Marcello Moores  Register   On: 07/28/2017 11:31    ROS Blood pressure 124/76, pulse 65, temperature 98.1 F (36.7 C), temperature source Oral, resp.  rate 18, height '5\' 10"'  (1.778 m), weight 205 lb (93 kg), SpO2 99 %. Physical Exam   Assessment/Plan: Dysphagia with chest pain. Possible esophageal spasm. No problems with dysphagia at this time.  Recommend outpt DG esophagram or one before discharge. Agree with Protonix Thank u for allowing [place] to participate in his care.    Chlora Mcbain W 07/29/2017, 12:01 PM

## 2017-07-29 NOTE — Consult Note (Signed)
Cardiology Consultation:   Patient ID: Ralph Marquez; [id number]; 09-14-1956   Admit date: 07/28/2017 Date of Consult: 07/29/2017  Primary Care Provider: Pearson Grippe, MD Primary Cardiologist: New Dr. Purvis Sheffield Primary Electrophysiologist:  NA   Patient Profile:   Ralph Marquez is a 61 y.o. male with a hx of HTN who is being seen today for the evaluation of chest pain at the request of Dr. Ardyth Harps  History of Present Illness:   Ralph Marquez is a 61 year old male patient with history of hypertension who stopped his medications because of side effects. He presents with one-week history of exertional chest pain. No prior cardiac history. Initial EKG normal sinus rhythm normal EKG, troponins negative 4.   Patient complains of epigastric pressure that has progressively worsened over the past month. Occurs after every meal and can last hours to all day. Associated with dysphagia,nausea, diarrhea, and gray stools. No appetite and skipping meals. Yesterday had pain in am and when he went to work it got worse associated with shortness of breath. Just ate breakfast and has mild discomfort. BP in ER 153/108. Headaches on Amlodipine, rash and blisters on lisinopril and losartan. Smoked 40 years quit 3 yrs ago, Mother with CABG in 48's, sister with CVA, was told pre diabetes. No HLD. 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 No worsening symptoms with walking or going up stairs.  Past Medical History:  Diagnosis Date  . Hypertension     Past Surgical History:  Procedure Laterality Date  . bone spur shoulder    . REPLACEMENT TOTAL KNEE       Home Medications:  Prior to Admission medications   Medication Sig Start Date End Date Taking? Authorizing Provider  calcium carbonate (TUMS) 500 MG chewable tablet Chew 2 tablets by mouth 2 (two) times daily as needed for indigestion or heartburn (chest pain).   Yes [provider]  triazolam (HALCION)  0.25 MG tablet Take 0.25 mg by mouth at bedtime as needed for sleep.   Yes [provider]    Inpatient Medications: Scheduled Meds: . amLODipine  10 mg Oral Daily  . aspirin  81 mg Oral Daily  . enoxaparin (LOVENOX) injection  40 mg Subcutaneous Q24H  . pantoprazole  40 mg Oral Daily   Continuous Infusions:  PRN Meds: gi cocktail, nitroGLYCERIN, ondansetron (ZOFRAN) IV, traMADol, triazolam  Allergies:    Allergies  Allergen Reactions  . Amlodipine Hives  . Lisinopril Hives  . Losartan Potassium Hives  . Iodine Rash    Social History:   Social History   Social History  . Marital status: Married    Spouse name: N/A  . Number of children: N/A  . Years of education: N/A   Occupational History  . Not on file.   Social History Main Topics  . Smoking status: Former Smoker    Types: Cigarettes  . Smokeless tobacco: Never Used     Comment: 40 yrs quit 3 yrs ago  . Alcohol use No  . Drug use: No  . Sexual activity: Not on file   Other Topics Concern  . Not on file   Social History Narrative  . No narrative on file    Family History:      Family History  Problem Relation Age of Onset  . CAD Mother        CABG in 45's  . CVA Sister      ROS:  Please see  the history of present illness.  Review of Systems  Constitution: Positive for decreased appetite and weakness.  HENT: Negative.   Cardiovascular: Positive for chest pain and dyspnea on exertion.  Respiratory: Negative.   Endocrine: Negative.   Hematologic/Lymphatic: Negative.   Musculoskeletal: Negative.   Gastrointestinal: Positive for change in bowel habit, diarrhea, dysphagia, heartburn and nausea.  Genitourinary: Negative.      All other ROS reviewed and negative.     Physical Exam/Data:   Vitals:   07/28/17 1545 07/28/17 1609 07/28/17 2217 07/29/17 0533  BP:  (!) 145/84 132/73 124/76  Pulse: 69 74 65 65  Resp: 12 18 18 18   Temp:  98.4 F (36.9 C) 98.1 F (36.7 C) 98.1 F (36.7 C)   TempSrc:  Oral Oral Oral  SpO2: 97% 98% 96% 99%  Weight:  205 lb (93 kg)    Height:  5\' 10"  (1.778 m)      Intake/Output Summary (Last 24 hours) at 07/29/17 0843 Last data filed at 07/29/17 0533  Gross per 24 hour  Intake              600 ml  Output                0 ml  Net              600 ml   Filed Weights   07/28/17 1108 07/28/17 1609  Weight: 205 lb (93 kg) 205 lb (93 kg)   Body mass index is 29.41 kg/m.  General:  Well nourished, well developed, in no acute distress  HEENT: normal Lymph: no adenopathy Neck: no JVD Endocrine:  No thryomegaly Vascular: No carotid bruits; FA pulses 2+ bilaterally without bruits  Cardiac:  normal S1, S2; RRR S4 Lungs: Decreased BS otherwise clear to auscultation bilaterally, no wheezing, rhonchi or rales  Abd: soft, nontender, no hepatomegaly  Ext: no edema Musculoskeletal:  No deformities, BUE and BLE strength normal and equal Skin: warm and dry  Neuro:  CNs 2-12 intact, no focal abnormalities noted Psych:  Normal affect   EKG:  The EKG was personally reviewed and demonstrates:  Normal sinus rhythm, normal EKG Telemetry:  Telemetry was personally reviewed and demonstrates:  NSR  Relevant CV Studies:   Laboratory Data:  Chemistry  Recent Labs Lab 07/28/17 1114  NA 138  K 3.7  CL 104  CO2 25  GLUCOSE 105*  BUN 21*  CREATININE 0.92  CALCIUM 9.1  GFRNONAA >60  GFRAA >60  ANIONGAP 9    No results for input(s): PROT, ALBUMIN, AST, ALT, ALKPHOS, BILITOT in the last 168 hours. Hematology  Recent Labs Lab 07/28/17 1114  WBC 14.6*  RBC 4.99  HGB 15.4  HCT 45.6  MCV 91.4  MCH 30.9  MCHC 33.8  RDW 13.4  PLT 315   Cardiac Enzymes  Recent Labs Lab 07/28/17 1114 07/28/17 1441 07/28/17 2034 07/29/17 0214  TROPONINI <0.03 <0.03 <0.03 <0.03   No results for input(s): TROPIPOC in the last 168 hours.  BNPNo results for input(s): BNP, PROBNP in the last 168 hours.  DDimer No results for input(s): DDIMER in the  last 168 hours.  Radiology/Studies:  Dg Chest 2 View  Result Date: 07/28/2017 CLINICAL DATA:  Chest pain.  Fatigue. EXAM: CHEST  2 VIEW COMPARISON:  01/29/2016. FINDINGS: Mediastinum and hilar structures normal. Low lung volumes. No focal infiltrate. No pleural effusion or pneumothorax. Bilateral pleural-parenchymal thickening consistent with scarring. Degenerative changes thoracic spine. IMPRESSION: No acute cardiopulmonary disease.  Electronically Signed   By: Maisie Fus  Register   On: 07/28/2017 11:31    Assessment and Plan:   1. Chest pain somewhat atypical with a lot of GI symptoms including dysphagia, nausea, diarrhea, grey stools. Does have multiple CRF including uncontrolled HTN, family Hx, 40 pk years of smoking, pre diabetic. Normal EKG and Troponins x 4.Recommend exercise myoview and 2Decho. Patient ate today so could do as outpatient if discharged. If not, will plan tomorrow.   2. Hypertension untreated intolerant to Norvasc(HA), lisinopril and losartan(rash & blisters). Started on Amlodipine 10 mg daily yesterday. Will see if he tolerates. If not, can try beta blocker.check 2Decho.  3. Nausea, dysphagia, multiple GI complaints.   GI consult.     Signed, Jacolyn Reedy, PA-C  07/29/2017 8:43 AM   The patient was seen and examined, and I agree with the history, physical exam, assessment and plan as documented above, with modifications as noted below. I have also personally reviewed all relevant documentation, records, labs, and both radiographic and cardiovascular studies. I have also independently interpreted the ECG.  61 yr old male with at least a one month h/o retrosternal chest discomfort, aggravated by eating solids and liquids as well as "overexertion". Yesterday, he awoke with chest discomfort. No radiation to back, jaw, or arms. Says trying to eat solids or drink liquids "is like trying to swallow marbles". Also has had loose stools which are pale gray in color.  Used to  smoke 2-3 ppd and had smoked about 1 ppd up until 5 years ago (smoked for 40 yrs). Says he is "prediabetic". Also has hypertension. Mother had CABG at roughly the same age. No unintentional weight loss. Denies hematochezia/melena. Troponins normal. WBC elevated. ECG and chest xray are normal. Developed blisters with lisinopril and losartan and amlodipine caused headaches. Ate breakfast this morning.  Recommendations: He has both typical and atypical symptoms for ischemic heart disease, but does have several risk factors. Would recommend exercise Myoview stress test (either outpatient or tomorrow if he is still hospitalized) and echocardiogram. If amlodipine causes headaches, could consider chlorthalidone as this would be a first line agent for the treatment of hypertension. If noninvasive testing is suggestive of heart disease, a beta blocker could be initiated instead (albeit a second line agent for the treatment of hypertension).  He also has alarm symptoms given esophageal dysphagia for both solids and liquids (albeit no evidence of anemia or unintentional weight loss). Also has loose, altered colored stools. Given risk factors, requires upper endoscopy as well to evaluate for structural abnormalities (i.e. masses).   Prentice Docker, MD, [facility]  07/29/2017 9:13 AM

## 2017-07-29 NOTE — Progress Notes (Signed)
PROGRESS NOTE    Ralph Marquez  ZES:[id number] DOB: 1956/08/25 DOA: 07/28/2017 PCP: Pearson Grippe, MD     Brief Narrative:  61 year old man admitted to the hospital on 8/21 due to chest pain. He has a history of hypertension and has had reactions to lisinopril, losartan and amlodipine. He presents with about a one-week history of exertional chest pain. Chest pain also has some components of GI illness with dysphagia to solids, although patient states he only has dysphagia when he has chest pain. Admission has been requested.   Assessment & Plan:   Active Problems:   Chest pain   Chest pain -With both typical and atypical cardiac features. -Also has some GI features as well. -Has ruled out for ACS with negative troponins, EKG without acute ischemic abnormalities. -2-D echo with an ejection fraction of 65-70% with normal wall motion, mild LVH, diastolic dysfunction is not mentioned.  -Seen by cardiology with plans for stress testing in a.m. -GI consultation is still pending, suspect he would benefit from at least a barium swallow.   history of hypertension -Systolic blood pressure has been between 120 and 150. -Do not feel it is necessary to start antihypertensive agents while in the hospital, especially in light of his multiple drug allergies. -Will need close outpatient follow-up.   DVT prophylaxis: Lovenox Code Status: Full code Family Communication: patient only Disposition Plan: Home in 24-48 hours  Consultants:   Cardiology  GI  Procedures:   None  Antimicrobials:  Anti-infectives    None       Subjective: CP has resolved; no other complaints  Objective: Vitals:   07/28/17 1609 07/28/17 2217 07/29/17 0533 07/29/17 1300  BP: (!) 145/84 132/73 124/76 (!) 141/82  Pulse: 74 65 65 65  Resp: 18 18 18 20   Temp: 98.4 F (36.9 C) 98.1 F (36.7 C) 98.1 F (36.7 C) 98.6 F (37 C)  TempSrc: Oral Oral Oral Oral  SpO2: 98% 96% 99% 95%  Weight: 93 kg (205 lb)      Height: 5\' 10"  (1.778 m)       Intake/Output Summary (Last 24 hours) at 07/29/17 1744 Last data filed at 07/29/17 1200  Gross per 24 hour  Intake              840 ml  Output                0 ml  Net              840 ml   Filed Weights   07/28/17 1108 07/28/17 1609  Weight: 93 kg (205 lb) 93 kg (205 lb)    Examination:  General exam: Alert, awake, oriented x 3 Respiratory system: Clear to auscultation. Respiratory effort normal. Cardiovascular system:RRR. No murmurs, rubs, gallops. Gastrointestinal system: Abdomen is nondistended, soft and nontender. No organomegaly or masses felt. Normal bowel sounds heard. Central nervous system: Alert and oriented. No focal neurological deficits. Extremities: No C/C/E, +pedal pulses Skin: No rashes, lesions or ulcers Psychiatry: Judgement and insight appear normal. Mood & affect appropriate.     Data Reviewed: I have personally reviewed following labs and imaging studies  CBC:  Recent Labs Lab 07/28/17 1114  WBC 14.6*  HGB 15.4  HCT 45.6  MCV 91.4  PLT 315   Basic Metabolic Panel:  Recent Labs Lab 07/28/17 1114  NA 138  K 3.7  CL 104  CO2 25  GLUCOSE 105*  BUN 21*  CREATININE 0.92  CALCIUM 9.1  GFR: Estimated Creatinine Clearance: 97.8 mL/min (by C-G formula based on SCr of 0.92 mg/dL). Liver Function Tests: No results for input(s): AST, ALT, ALKPHOS, BILITOT, PROT, ALBUMIN in the last 168 hours. No results for input(s): LIPASE, AMYLASE in the last 168 hours. No results for input(s): AMMONIA in the last 168 hours. Coagulation Profile: No results for input(s): INR, PROTIME in the last 168 hours. Cardiac Enzymes:  Recent Labs Lab 07/28/17 1114 07/28/17 1441 07/28/17 2034 07/29/17 0214  TROPONINI <0.03 <0.03 <0.03 <0.03   BNP (last 3 results) No results for input(s): PROBNP in the last 8760 hours. HbA1C: No results for input(s): HGBA1C in the last 72 hours. CBG: No results for input(s): GLUCAP in the  last 168 hours. Lipid Profile: No results for input(s): CHOL, HDL, LDLCALC, TRIG, CHOLHDL, LDLDIRECT in the last 72 hours. Thyroid Function Tests: No results for input(s): TSH, T4TOTAL, FREET4, T3FREE, THYROIDAB in the last 72 hours. Anemia Panel: No results for input(s): VITAMINB12, FOLATE, FERRITIN, TIBC, IRON, RETICCTPCT in the last 72 hours. Urine analysis: No results found for: COLORURINE, APPEARANCEUR, LABSPEC, PHURINE, GLUCOSEU, HGBUR, BILIRUBINUR, KETONESUR, PROTEINUR, UROBILINOGEN, NITRITE, LEUKOCYTESUR Sepsis Labs: @LABRCNTIP (procalcitonin:4,lacticidven:4)  )No results found for this or any previous visit (from the past 240 hour(s)).       Radiology Studies: Dg Chest 2 View  Result Date: 07/28/2017 CLINICAL DATA:  Chest pain.  Fatigue. EXAM: CHEST  2 VIEW COMPARISON:  01/29/2016. FINDINGS: Mediastinum and hilar structures normal. Low lung volumes. No focal infiltrate. No pleural effusion or pneumothorax. Bilateral pleural-parenchymal thickening consistent with scarring. Degenerative changes thoracic spine. IMPRESSION: No acute cardiopulmonary disease. Electronically Signed   By: Maisie Fus  Register   On: 07/28/2017 11:31        Scheduled Meds: . amLODipine  10 mg Oral Daily  . aspirin  81 mg Oral Daily  . enoxaparin (LOVENOX) injection  40 mg Subcutaneous Q24H  . pantoprazole  40 mg Oral Daily   Continuous Infusions:   LOS: 0 days    Time spent: 25 minutes. Greater than 50% of this time was spent in direct contact with the patient coordinating care.     Chaya Jan, MD [facility] Pager [phone]  If 7PM-7AM, please contact night-coverage [email or web address] Password [facility] 07/29/2017, 5:44 PM

## 2017-07-30 ENCOUNTER — Encounter (HOSPITAL_COMMUNITY)
Admission: EM | Disposition: A | Discharge status: Home / Self Care | Payer: Self-pay | Source: Home / Self Care | Attending: Emergency Medicine

## 2017-07-30 ENCOUNTER — Encounter (HOSPITAL_COMMUNITY): Payer: Self-pay

## 2017-07-30 ENCOUNTER — Observation Stay (HOSPITAL_BASED_OUTPATIENT_CLINIC_OR_DEPARTMENT_OTHER): Payer: Commercial Managed Care - PPO

## 2017-07-30 DIAGNOSIS — R9439 Abnormal result of other cardiovascular function study: Secondary | ICD-10-CM

## 2017-07-30 DIAGNOSIS — K2970 Gastritis, unspecified, without bleeding: Secondary | ICD-10-CM | POA: Diagnosis not present

## 2017-07-30 DIAGNOSIS — R079 Chest pain, unspecified: Secondary | ICD-10-CM

## 2017-07-30 DIAGNOSIS — I10 Essential (primary) hypertension: Secondary | ICD-10-CM | POA: Diagnosis not present

## 2017-07-30 DIAGNOSIS — K228 Other specified diseases of esophagus: Secondary | ICD-10-CM | POA: Diagnosis not present

## 2017-07-30 DIAGNOSIS — K219 Gastro-esophageal reflux disease without esophagitis: Secondary | ICD-10-CM

## 2017-07-30 DIAGNOSIS — K210 Gastro-esophageal reflux disease with esophagitis: Secondary | ICD-10-CM | POA: Diagnosis not present

## 2017-07-30 DIAGNOSIS — R1310 Dysphagia, unspecified: Secondary | ICD-10-CM | POA: Diagnosis not present

## 2017-07-30 DIAGNOSIS — K269 Duodenal ulcer, unspecified as acute or chronic, without hemorrhage or perforation: Secondary | ICD-10-CM | POA: Diagnosis not present

## 2017-07-30 DIAGNOSIS — K2960 Other gastritis without bleeding: Secondary | ICD-10-CM | POA: Diagnosis not present

## 2017-07-30 HISTORY — PX: ESOPHAGOGASTRODUODENOSCOPY: SHX5428

## 2017-07-30 LAB — NM MYOCAR MULTI W/SPECT W/WALL MOTION / EF
CHL CUP NUCLEAR SDS: 1
CSEPHR: 76 %
CSEPPHR: 123 {beats}/min
Estimated workload: 10.1 METS
Exercise duration (min): 7 min
Exercise duration (sec): 23 s
LHR: 0.4
LVDIAVOL: 86 mL (ref 62–150)
LVSYSVOL: 29 mL
MPHR: 160 {beats}/min
RPE: 15
Rest HR: 62 {beats}/min
SRS: 0
SSS: 1
TID: 0.87

## 2017-07-30 SURGERY — EGD (ESOPHAGOGASTRODUODENOSCOPY)
Anesthesia: Moderate Sedation

## 2017-07-30 MED ORDER — REGADENOSON 0.4 MG/5ML IV SOLN
INTRAVENOUS | Status: AC
  Administered 2017-07-30: 0.4 mg via INTRAVENOUS
  Filled 2017-07-30: qty 5

## 2017-07-30 MED ORDER — SODIUM CHLORIDE 0.9% FLUSH
INTRAVENOUS | Status: AC
  Administered 2017-07-30: 10 mL via INTRAVENOUS
  Filled 2017-07-30: qty 10

## 2017-07-30 MED ORDER — ISOSORBIDE MONONITRATE ER 60 MG PO TB24
30.0000 mg | ORAL_TABLET | Freq: Every day | ORAL | Status: DC
  Administered 2017-07-30: 30 mg via ORAL
  Filled 2017-07-30: qty 1

## 2017-07-30 MED ORDER — MIDAZOLAM HCL 5 MG/5ML IJ SOLN
INTRAMUSCULAR | Status: DC | PRN
  Administered 2017-07-30 (×4): 2 mg via INTRAVENOUS

## 2017-07-30 MED ORDER — ISOSORBIDE MONONITRATE ER 30 MG PO TB24: 30.0000 mg | ORAL_TABLET | Freq: Every day | ORAL | 1 refills | Status: DC

## 2017-07-30 MED ORDER — MIDAZOLAM HCL 5 MG/5ML IJ SOLN
INTRAMUSCULAR | Status: AC
  Filled 2017-07-30: qty 10

## 2017-07-30 MED ORDER — PANTOPRAZOLE SODIUM 40 MG PO TBEC: 40.0000 mg | DELAYED_RELEASE_TABLET | Freq: Two times a day (BID) | ORAL | 2 refills | Status: DC

## 2017-07-30 MED ORDER — STERILE WATER FOR IRRIGATION IR SOLN
Status: DC | PRN
  Administered 2017-07-30: 100 mL

## 2017-07-30 MED ORDER — MEPERIDINE HCL 50 MG/ML IJ SOLN
INTRAMUSCULAR | Status: DC | PRN
  Administered 2017-07-30 (×2): 25 mg via INTRAVENOUS

## 2017-07-30 MED ORDER — SODIUM CHLORIDE 0.9 % IV SOLN
INTRAVENOUS | Status: DC
  Administered 2017-07-30: 14:00:00 via INTRAVENOUS

## 2017-07-30 MED ORDER — TECHNETIUM TC 99M TETROFOSMIN IV KIT
30.0000 | PACK | Freq: Once | INTRAVENOUS | Status: AC | PRN
  Administered 2017-07-30: 30 via INTRAVENOUS

## 2017-07-30 MED ORDER — MEPERIDINE HCL 50 MG/ML IJ SOLN
INTRAMUSCULAR | Status: AC
  Filled 2017-07-30: qty 1

## 2017-07-30 MED ORDER — TECHNETIUM TC 99M TETROFOSMIN IV KIT
10.0000 | PACK | Freq: Once | INTRAVENOUS | Status: AC | PRN
  Administered 2017-07-30: 10.7 via INTRAVENOUS

## 2017-07-30 MED ORDER — LIDOCAINE VISCOUS 2 % MT SOLN
OROMUCOSAL | Status: AC
  Filled 2017-07-30: qty 15

## 2017-07-30 MED ORDER — LIDOCAINE VISCOUS 2 % MT SOLN
OROMUCOSAL | Status: DC | PRN
  Administered 2017-07-30: 4 mL via OROMUCOSAL

## 2017-07-30 MED ORDER — ASPIRIN 81 MG PO CHEW: 81.0000 mg | CHEWABLE_TABLET | Freq: Every day | ORAL | Status: DC

## 2017-07-30 NOTE — Progress Notes (Signed)
Brief EGD note.  Erosive reflux esophagitis. Serrated/baby GE junction. Biopsy taken to rule out short segment Barrett's. Erosive antral gastritis. Biopsy taken. Erosive bulbar duodenitis.

## 2017-07-30 NOTE — Progress Notes (Signed)
Patient is to be discharged home and in stable condition. IV and telemetry removed, WNL. Patient given discharge instructions and verbalized understanding. Patient denies the need for a wheelchair and is requesting to ambulate to transportation.  Quita Skye, RN

## 2017-07-30 NOTE — Progress Notes (Signed)
Progress Note  Patient Name: Ralph Marquez Date of Encounter: 07/30/2017  Primary Cardiologist: Dr. Purvis Sheffield   Subjective   Patient assessed in lab prior to stress test. No chest pain this am. Had mild pain last night. Related it to being "stressed out."   Inpatient Medications    Scheduled Meds: . amLODipine  10 mg Oral Daily  . aspirin  81 mg Oral Daily  . pantoprazole  40 mg Oral BID AC   Continuous Infusions:  PRN Meds: gi cocktail, nitroGLYCERIN, ondansetron (ZOFRAN) IV, technetium tetrofosmin, traMADol, triazolam   Vital Signs    Vitals:   07/29/17 1300 07/29/17 1851 07/29/17 2133 07/30/17 0617  BP: (!) 141/82 (!) 158/84 136/83 130/90  Pulse: 65 69 68 (!) 58  Resp: 20  20 20   Temp: 98.6 F (37 C)  98.7 F (37.1 C) 97.6 F (36.4 C)  TempSrc: Oral  Oral Oral  SpO2: 95%  94% 95%  Weight:      Height:        Intake/Output Summary (Last 24 hours) at 07/30/17 0848 Last data filed at 07/29/17 1800  Gross per 24 hour  Intake              480 ml  Output                0 ml  Net              480 ml   Filed Weights   07/28/17 1108 07/28/17 1609  Weight: 205 lb (93 kg) 205 lb (93 kg)    Telemetry    NSR.  Personally Reviewed  Physical Exam   GEN: No acute distress.   Neck: No JVD Cardiac: RRR, no murmurs, rubs, or gallops.  Respiratory: Clear to auscultation bilaterally. GI: Soft, nontender, non-distended  MS: No edema; No deformity. Neuro:  Nonfocal  Psych: Normal affect   Labs    Chemistry Recent Labs Lab 07/28/17 1114 07/29/17 2007  NA 138  --   K 3.7  --   CL 104  --   CO2 25  --   GLUCOSE 105*  --   BUN 21*  --   CREATININE 0.92  --   CALCIUM 9.1  --   PROT  --  7.0  ALBUMIN  --  3.6  AST  --  23  ALT  --  35  ALKPHOS  --  71  BILITOT  --  0.3  GFRNONAA >60  --   GFRAA >60  --   ANIONGAP 9  --      Hematology Recent Labs Lab 07/28/17 1114  WBC 14.6*  RBC 4.99  HGB 15.4  HCT 45.6  MCV 91.4  MCH 30.9  MCHC 33.8   RDW 13.4  PLT 315    Cardiac Enzymes Recent Labs Lab 07/28/17 1114 07/28/17 1441 07/28/17 2034 07/29/17 0214  TROPONINI <0.03 <0.03 <0.03 <0.03   No results for input(s): TROPIPOC in the last 168 hours.     Radiology    Dg Chest 2 View  Result Date: 07/28/2017 CLINICAL DATA:  Chest pain.  Fatigue. EXAM: CHEST  2 VIEW COMPARISON:  01/29/2016. FINDINGS: Mediastinum and hilar structures normal. Low lung volumes. No focal infiltrate. No pleural effusion or pneumothorax. Bilateral pleural-parenchymal thickening consistent with scarring. Degenerative changes thoracic spine. IMPRESSION: No acute cardiopulmonary disease. Electronically Signed   By: Maisie Fus  Register   On: 07/28/2017 11:31    Cardiac Studies  Echocardiogram 07/29/2017 Left ventricle: The  cavity size was normal. Wall thickness was   increased in a pattern of mild LVH. Systolic function was   vigorous. The estimated ejection fraction was in the range of 65%   to 70%. Wall motion was normal; there were no regional wall   motion abnormalities. There was an increased relative   contribution of atrial contraction to ventricular filling.    Patient Profile     61 y.o. male with hypertension who stopped his medications because of side effects. He presents with one-week history of exertional chest pain. No prior cardiac history. Hx of tobacco abuse. Had associated nausea and vomiting with dysphagia.   Assessment & Plan    1. Chest Pain: Troponin negative x 3. No further chest discomfort since last evening which he suggests is related to being "stressed out." Stress myoview is in process. Echo demonstrates normal LV fx with no WMA abnormalities.   2. Uncontrolled HTN: Patient had stopped taking his medications. Now much better controlled on amlodipine, although he states he is allergic to it with complaints of headache. .   3. Nausea and vomiting: No further symptoms. He is on PPI.     Signed, Bettey Mare. Liborio Nixon,  ANP, AACC   07/30/2017, 8:48 AM    The patient was seen and examined, and I agree with the history, physical exam, assessment and plan as documented above, with modifications as noted below.  Pt had chest pain last night and during stress test when BP was elevated. EGD is being planned. Stress test was equivocal with an inferior wall defect that was potentially suspicious for old scar (no ischemia), but there was also significant soft tissue attenuation artifact. The fact that he has normal regional wall motion and LV systolic function is reassuring.  EGD is being planned given chest pain with odynophagia and esophageal dysphagia.  Recommendations: I will start Imdur 30 mg daily and arrange for outpatient follow up in 4-6 weeks. Proceed with EGD. Continue Protonix.    Prentice Docker, MD, [facility]  07/30/2017 12:02 PM

## 2017-07-30 NOTE — Op Note (Signed)
[facility] Patient Name: Ralph Marquez Procedure Date: 07/30/2017 2:49 PM MRN: [id number] Date of Birth: August 24, 1956 Attending MD: Lionel December , MD CSN: [id number] Age: 61 Admit Type: Inpatient Procedure:                Upper GI endoscopy Indications:              Odynophagia, Unexplained chest pain Providers:                Lionel December, MD, Edrick Kins, RN, Dyann Ruddle Referring MD:             Jill Poling, MD Medicines:                Lidocaine spray, Meperidine 50 mg IV, Midazolam 8                            mg IV Complications:            No immediate complications. Estimated Blood Loss:     Estimated blood loss was minimal. Procedure:                Pre-Anesthesia Assessment:                           - Prior to the procedure, a History and Physical                            was performed, and patient medications and                            allergies were reviewed. The patient's tolerance of                            previous anesthesia was also reviewed. The risks                            and benefits of the procedure and the sedation                            options and risks were discussed with the patient.                            All questions were answered, and informed consent                            was obtained. Prior Anticoagulants: The patient has                            taken no previous anticoagulant or antiplatelet                            agents. ASA Grade Assessment: II - A patient with                            mild systemic disease. After reviewing the risks  and benefits, the patient was deemed in                            satisfactory condition to undergo the procedure.                           After obtaining informed consent, the endoscope was                            passed under direct vision. Throughout the                            procedure, the patient's blood pressure, pulse,  and                            oxygen saturations were monitored continuously. The                            [id number]-[id number] ([id number]) scope was introduced through the                            mouth, and advanced to the second part of duodenum.                            The upper GI endoscopy was accomplished without                            difficulty. The patient tolerated the procedure                            well. Scope In: 3:18:26 PM Scope Out: 3:29:34 PM Total Procedure Duration: 0 hours 11 minutes 8 seconds  Findings:      The proximal esophagus and mid esophagus were normal.      LA Grade C (one or more mucosal breaks continuous between tops of 2 or       more mucosal folds, less than 75% circumference) esophagitis was found       37 to 40 cm from the incisors.      The Z-line was irregular and was found 40 cm from the incisors. Biopsies       were taken with a cold forceps for histology. The pathology specimen was       placed into Bottle Number 2.      Inflammation characterized by congestion (edema), erosions and erythema       was found in the gastric antrum and in the prepyloric region of the       stomach. Biopsies were taken with a cold forceps for histology. The       pathology specimen was placed into Bottle Number 1.      The exam of the stomach was otherwise normal.      A few erosions without bleeding were found in the duodenal bulb.      The second portion of the duodenum was normal. Impression:               - Normal proximal esophagus and mid esophagus.                           -  LA Grade C reflux esophagitis.                           - Z-line irregular, 40 cm from the incisors.                            Biopsied.                           - Gastritis. Biopsied.                           - Duodenal erosions without bleeding.                           - Normal second portion of the duodenum.                           comment: Endoscopic findings may explain  pat chest                            pain and odynophagia.                           Esophagus was not dilated. Moderate Sedation:      Moderate (conscious) sedation was administered by the endoscopy nurse       and supervised by the endoscopist. The following parameters were       monitored: oxygen saturation, heart rate, blood pressure, CO2       capnography and response to care. Total physician intraservice time was       20 minutes. Recommendation:           - Patient has a contact number available for                            emergencies. The signs and symptoms of potential                            delayed complications were discussed with the                            patient. Return to normal activities tomorrow.                            Written discharge instructions were provided to the                            patient.                           - Resume previous diet today.                           - Continue present medications.                           - Await pathology results. Procedure Code(s):        ---  Professional ---                           [phone], Esophagogastroduodenoscopy, flexible,                            transoral; with biopsy, single or multiple                           99152, Moderate sedation services provided by the                            same physician or other qualified health care                            professional performing the diagnostic or                            therapeutic service that the sedation supports,                            requiring the presence of an independent trained                            observer to assist in the monitoring of the                            patient's level of consciousness and physiological                            status; initial 15 minutes of intraservice time,                            patient age 69 years or older Diagnosis Code(s):        --- Professional ---                            K21.0, Gastro-esophageal reflux disease with                            esophagitis                           K22.8, Other specified diseases of esophagus                           K29.70, Gastritis, unspecified, without bleeding                           K26.9, Duodenal ulcer, unspecified as acute or                            chronic, without hemorrhage or perforation                           R13.10, Dysphagia, unspecified  R07.9, Chest pain, unspecified CPT copyright 2016 American Medical Association. All rights reserved. The codes documented in this report are preliminary and upon coder review may  be revised to meet current compliance requirements. Lionel December, MD Lionel December, MD 07/30/2017 3:41:27 PM This report has been signed electronically. Number of Addenda: 0

## 2017-07-30 NOTE — Discharge Summary (Signed)
Physician Discharge Summary  FREDRICK GUINANE WLK:[id number] DOB: 1956-10-22 DOA: 07/28/2017  PCP: Pearson Grippe, MD  Admit date: 07/28/2017 Discharge date: 07/30/2017  Time spent: 45 minutes  Recommendations for Outpatient Follow-up:  -To be discharged home today. -Will follow up with Dr. Karilyn Cota in 2 weeks.   Discharge Diagnoses:  Active Problems:   Chest pain   Discharge Condition: Stable and improved  Filed Weights   07/28/17 1108 07/28/17 1609 07/30/17 1404  Weight: 93 kg (205 lb) 93 kg (205 lb) 93 kg (205 lb)    History of present illness:  As per Dr. Harle Stanford on 8/21: Ralph Marquez is a 61 y.o. male with medical history significant of poorly controlled hypertension, smoking in the past one pack every day till 3 years ago who presents to the ER today with a chief complaint of worsening chest discomfort substernal on and off for the last 2 weeks associated with dysphagia and painful swallowing , not associated with any nausea or vomiting or fevers or chills or cough or shortness of breath. He described the chest pain as pressure-like substernal severe in nature crushing without diaphoresis or shortness of breath. In our ER he was evaluated when he was found to be hypertensive , with a negative EKG and chest x-ray, was given nitroglycerin sublingual and I have been called to evaluate situation  Hospital Course:   Chest pain -This is most likely secondary to GI disease based on EGD findings. -EGD showed reflux esophagitis, gastritis and duodenal erosions. GI has recommended twice-daily PPI. -Was also seen by cardiology and had a nuclear medicine stress testwith findings of a medium defect of moderate severity which are equivocal with both myocardial scar and overlying soft tissue attenuation artifact. Low to intermediate risk stress test. -Cardiology has not recommended any further workup, has started him on imdur.   history of hypertension -Systolic blood pressure has been  between 120 and 150. -Do not feel it is necessary to start antihypertensive agents while in the hospital, especially in light of his multiple drug allergies. -Will need close outpatient follow-up.   Procedures:  EGD 8/22: Reflux esophagitis, gastritis, duodenal erosions.  Consultations:  Cardiology  Gastroenterology  Discharge Instructions  Discharge Instructions    Diet - low sodium heart healthy    Complete by:  As directed    Increase activity slowly    Complete by:  As directed      Allergies as of 07/30/2017      Reactions   Amlodipine Hives   Lisinopril Hives   Losartan Potassium Hives   Iodine Rash      Medication List    TAKE these medications   aspirin 81 MG chewable tablet Chew 1 tablet (81 mg total) by mouth daily.   isosorbide mononitrate 30 MG 24 hr tablet Commonly known as:  IMDUR Take 1 tablet (30 mg total) by mouth daily.   pantoprazole 40 MG tablet Commonly known as:  PROTONIX Take 1 tablet (40 mg total) by mouth 2 (two) times daily before a meal.   triazolam 0.25 MG tablet Commonly known as:  HALCION Take 0.25 mg by mouth at bedtime as needed for sleep.   TUMS 500 MG chewable tablet Generic drug:  calcium carbonate Chew 2 tablets by mouth 2 (two) times daily as needed for indigestion or heartburn (chest pain).            Discharge Care Instructions        Start  Ordered   07/31/17 0000  aspirin 81 MG chewable tablet  Daily     07/30/17 1701   07/31/17 0000  isosorbide mononitrate (IMDUR) 30 MG 24 hr tablet  Daily     07/30/17 1701   07/31/17 0000  pantoprazole (PROTONIX) 40 MG tablet  2 times daily before meals     07/30/17 1701   07/30/17 0000  Increase activity slowly     07/30/17 1701   07/30/17 0000  Diet - low sodium heart healthy     07/30/17 1701     Allergies  Allergen Reactions  . Amlodipine Hives  . Lisinopril Hives  . Losartan Potassium Hives  . Iodine Rash   Follow-up Information    Rehman, Joline Maxcy,  MD. Schedule an appointment as soon as possible for a visit in 2 week(s).   Specialty:  Gastroenterology Contact information: [address], [address] [place] [address] [phone]            The results of significant diagnostics from this hospitalization (including imaging, microbiology, ancillary and laboratory) are listed below for reference.    Significant Diagnostic Studies: Dg Chest 2 View  Result Date: 07/28/2017 CLINICAL DATA:  Chest pain.  Fatigue. EXAM: CHEST  2 VIEW COMPARISON:  01/29/2016. FINDINGS: Mediastinum and hilar structures normal. Low lung volumes. No focal infiltrate. No pleural effusion or pneumothorax. Bilateral pleural-parenchymal thickening consistent with scarring. Degenerative changes thoracic spine. IMPRESSION: No acute cardiopulmonary disease. Electronically Signed   By: Maisie Fus  Register   On: 07/28/2017 11:31   Nm Myocar Multi W/spect Izetta Dakin Motion / Ef  Result Date: 07/30/2017  Blood pressure demonstrated a hypertensive response to exercise.  There was no ST segment deviation noted during stress.  Defect 1: There is a medium defect of moderate severity present in the basal inferoseptal, basal inferior, mid inferoseptal, mid inferior and apical inferior location. Equivocal findings with both myocardial scar and overlying soft tissue attenuation artifact contributing to defect. No significant ischemic territories.  Nuclear stress EF: 66%.  Low to intermediate risk stress test.     Microbiology: No results found for this or any previous visit (from the past 240 hour(s)).   Labs: Basic Metabolic Panel:  Recent Labs Lab 07/28/17 1114  NA 138  K 3.7  CL 104  CO2 25  GLUCOSE 105*  BUN 21*  CREATININE 0.92  CALCIUM 9.1   Liver Function Tests:  Recent Labs Lab 07/29/17 2007  AST 23  ALT 35  ALKPHOS 71  BILITOT 0.3  PROT 7.0  ALBUMIN 3.6   No results for input(s): LIPASE, AMYLASE in the last 168 hours. No results for input(s):  AMMONIA in the last 168 hours. CBC:  Recent Labs Lab 07/28/17 1114  WBC 14.6*  HGB 15.4  HCT 45.6  MCV 91.4  PLT 315   Cardiac Enzymes:  Recent Labs Lab 07/28/17 1114 07/28/17 1441 07/28/17 2034 07/29/17 0214  TROPONINI <0.03 <0.03 <0.03 <0.03   BNP: BNP (last 3 results) No results for input(s): BNP in the last 8760 hours.  ProBNP (last 3 results) No results for input(s): PROBNP in the last 8760 hours.  CBG: No results for input(s): GLUCAP in the last 168 hours.     SignedChaya Jan  [facility] Pager: [phone] 07/30/2017, 5:02 PM

## 2017-07-30 NOTE — Discharge Instructions (Signed)
Resume aspirin on Monday, August 27.

## 2017-08-12 NOTE — Progress Notes (Signed)
I've sent [facility] a staff message to open a spot for this patient with Dr. Karilyn Cotaehman.

## 2017-08-14 ENCOUNTER — Ambulatory Visit (INDEPENDENT_AMBULATORY_CARE_PROVIDER_SITE_OTHER): Payer: Commercial Managed Care - PPO | Admitting: Adult Health

## 2017-08-14 ENCOUNTER — Encounter (HOSPITAL_COMMUNITY): Payer: Self-pay | Admitting: Internal Medicine

## 2017-08-14 VITALS — BP 144/86 | HR 96 | Ht 70.0 in | Wt 210.0 lb

## 2017-08-14 DIAGNOSIS — I10 Essential (primary) hypertension: Secondary | ICD-10-CM | POA: Diagnosis not present

## 2017-08-14 DIAGNOSIS — R0789 Other chest pain: Secondary | ICD-10-CM

## 2017-08-14 MED ORDER — HYDRALAZINE HCL 25 MG PO TABS: 25.0000 mg | ORAL_TABLET | Freq: Two times a day (BID) | ORAL | 3 refills | Status: DC

## 2017-08-14 NOTE — Progress Notes (Signed)
Cardiology Office Note   Date:  08/14/2017   ID:  Ralph Marquez, DOB 09/06/1956, MRN [id number]  PCP:  Pearson GrippeKim, James, MD  Cardiologist:  Purvis SheffieldKoneswaran Chief Complaint  Patient presents with  . Chest Pain  . Hypertension      History of Present Illness: Ralph Marquez is a 61 y.o. male who presents for ongoing assessment and management of hypertension, chest pain, with posthospitalization follow-up after admission for same, along with nausea diarrhea and dysphagia. Apparently had gone to work and began to have significant dyspnea and chest discomfort. The patient was ruled out for ACS. It is felt that the patient's discomfort was related to GI etiology. EGD was completed revealing reflux esophagitis, gastritis, and duodenal erosions. The patient was placed on PPI twice a day.  The patient did have a nuclear medicine stress test during hospitalization. He was low risk and no further cardiac testing was planned.  He is unable to tolerate isosorbide as this causes severe headaches. He is allergic to amlodipine lisinopril and losartan which causes hives. He denies recurrent chest pain. He has not yet been seen by GI on follow-up as an outpatient.  Past Medical History:  Diagnosis Date  . Hypertension     Past Surgical History:  Procedure Laterality Date  . bone spur shoulder    . ESOPHAGOGASTRODUODENOSCOPY N/A 07/30/2017   Procedure: ESOPHAGOGASTRODUODENOSCOPY (EGD) Possible esophageal dilation.;  Surgeon: Malissa Hippoehman, Najeeb U, MD;  Location: [facility];  Service: Endoscopy;  Laterality: N/A;  . REPLACEMENT TOTAL KNEE       Current Outpatient Prescriptions  Medication Sig Dispense Refill  . aspirin 81 MG chewable tablet Chew 1 tablet (81 mg total) by mouth daily.    . calcium carbonate (TUMS) 500 MG chewable tablet Chew 2 tablets by mouth 2 (two) times daily as needed for indigestion or heartburn (chest pain).    . isosorbide mononitrate (IMDUR) 30 MG 24 hr tablet Take 1 tablet (30 mg  total) by mouth daily. 30 tablet 1  . pantoprazole (PROTONIX) 40 MG tablet Take 1 tablet (40 mg total) by mouth 2 (two) times daily before a meal. 60 tablet 2  . triazolam (HALCION) 0.25 MG tablet Take 0.25 mg by mouth at bedtime as needed for sleep.     No current facility-administered medications for this visit.     Allergies:   Amlodipine; Lisinopril; Losartan potassium; and Iodine    Social History:  The patient  reports that he has quit smoking. His smoking use included Cigarettes. He has never used smokeless tobacco. He reports that he does not drink alcohol or use drugs.   Family History:  The patient's family history includes CAD in his mother; CVA in his sister.    ROS: All other systems are reviewed and negative. Unless otherwise mentioned in H&P    PHYSICAL EXAM: VS:  There were no vitals taken for this visit. , BMI There is no height or weight on file to calculate BMI. GEN: Well nourished, well developed, in no acute distress  HEENT: normal  Neck: no JVD, carotid bruits, or masses Cardiac: RRR; no murmurs, rubs, or gallops,no edema  Respiratory:  clear to auscultation bilaterally, normal work of breathing GI: soft, nontender, nondistended, + BS MS: no deformity or atrophy  Skin: warm and dry, no rash Neuro:  Strength and sensation are intact Psych: euthymic mood, full affect   Recent Labs: 07/28/2017: BUN 21; Creatinine, Ser 0.92; Hemoglobin 15.4; Platelets 315; Potassium 3.7; Sodium 138 07/29/2017: ALT  35    Lipid Panel No results found for: CHOL, TRIG, HDL, CHOLHDL, VLDL, LDLCALC, LDLDIRECT    Wt Readings from Last 3 Encounters:  04-Aug-2017 205 lb (93 kg)      Other studies Reviewed: NM Stress Test Aug 04, 2017   Blood pressure demonstrated a hypertensive response to exercise.  There was no ST segment deviation noted during stress.  Defect 1: There is a medium defect of moderate severity present in the basal inferoseptal, basal inferior, mid inferoseptal,  mid inferior and apical inferior location. Equivocal findings with both myocardial scar and overlying soft tissue attenuation artifact contributing to defect. No significant ischemic territories.  Nuclear stress EF: 66%.  Low to intermediate risk stress test.   Echocardiogram 07/29/2017 Left ventricle: The cavity size was normal. Wall thickness was   increased in a pattern of mild LVH. Systolic function was   vigorous. The estimated ejection fraction was in the range of 65%   to 70%. Wall motion was normal; there were no regional wall   motion abnormalities. There was an increased relative   contribution of atrial contraction to ventricular filling.  ASSESSMENT AND PLAN:  1. Hypertension: He is intolerant of many medications as listed above. He is unable to tolerate isosorbide due to severe headache even though he is taking it for 2 weeks. I'm going to discontinue isosorbide and begin hydralazine 25 mg twice a day. I've given him a blood pressure recording sheet, he is to take his blood pressure twice a day. He will come back in 2 weeks for a nurse visit to check his blood pressure on the new medication. I have given him parameters of blood pressures we would like to have him stay within (120/60-135//75). He is to call us if he does not tolerate this medication. We may need to consider other options such as spironolactone or chlorothiazide.  2. GERD: He is yet to see primary care or GI posthospitalization follow-up. He plans to see them within one month.   Current medicines are reviewed at length with the patient today.    Labs/ tests ordered today include:  Bettey Mare. Liborio Nixon, ANP, AACC   08/14/2017 2:21 PM    [facility] [address] [place], [place] [address] Phone: [phone]; Fax: [phone]

## 2017-08-14 NOTE — Patient Instructions (Signed)
Medication Instructions:  Your physician has recommended you make the following change in your medication: Stop Taking Imdur  Start Taking Hydralazine 25 mg Two Times Daily    Labwork: NONE  Testing/Procedures: NONE  Follow-Up: Your physician recommends that you schedule a follow-up appointment in: 2 Weeks for a Blood Pressure Check  Your physician recommends that you schedule a follow-up appointment in: 1 Month with Joni ReiningKathryn Lawrence, DNP     Any Other Special Instructions Will Be Listed Below (If Applicable). Your physician has requested that you regularly monitor and record your blood pressure readings at home. Please use the same machine at the same time of day to check your readings and record them to bring to your follow-up visit.   If you need a refill on your cardiac medications before your next appointment, please call your pharmacy.  Thank you for choosing [facility]!

## 2017-08-26 ENCOUNTER — Encounter (INDEPENDENT_AMBULATORY_CARE_PROVIDER_SITE_OTHER): Payer: Self-pay | Admitting: Internal Medicine

## 2017-08-28 ENCOUNTER — Ambulatory Visit (INDEPENDENT_AMBULATORY_CARE_PROVIDER_SITE_OTHER): Payer: Commercial Managed Care - PPO

## 2017-08-28 VITALS — BP 172/96 | HR 74 | Ht 70.0 in | Wt 209.0 lb

## 2017-08-28 DIAGNOSIS — I10 Essential (primary) hypertension: Secondary | ICD-10-CM | POA: Diagnosis not present

## 2017-08-28 MED ORDER — HYDRALAZINE HCL 25 MG PO TABS: 50.0000 mg | ORAL_TABLET | Freq: Two times a day (BID) | ORAL | 3 refills | Status: DC

## 2017-08-28 NOTE — Patient Instructions (Signed)
Return in 1 week for blood pressure check    INCREASE Hydralazine to 50 mg twice a day       Thank you for choosing [facility] !

## 2017-08-28 NOTE — Progress Notes (Signed)
Per Dr.Lawrence increase Hydralazine to 50 mg twice a day   Follow up in 1 week for BP check        Addendum: I was informed by Ralph Marquez that patient declined to make a one week follow up apt in 1 week.He made apt for 2 weeks

## 2017-09-11 ENCOUNTER — Ambulatory Visit (INDEPENDENT_AMBULATORY_CARE_PROVIDER_SITE_OTHER): Payer: Commercial Managed Care - PPO

## 2017-09-11 VITALS — BP 142/90

## 2017-09-11 DIAGNOSIS — I10 Essential (primary) hypertension: Secondary | ICD-10-CM | POA: Diagnosis not present

## 2017-09-11 NOTE — Progress Notes (Signed)
Pt came in stating that his blood pressure is still high with the medication change. He has given me a record that I will put in Kathyrn's folder for her to review.

## 2017-09-15 ENCOUNTER — Telehealth: Payer: Self-pay

## 2017-09-15 MED ORDER — HYDRALAZINE HCL 25 MG PO TABS: 50.0000 mg | ORAL_TABLET | Freq: Three times a day (TID) | ORAL | 3 refills | Status: DC

## 2017-09-15 NOTE — Telephone Encounter (Signed)
Called pt., no answer. Left a detailed message on his private voicemail to increase his hydralazine to 50 mg TID.  Ask pt. To call if he had any questions.

## 2017-09-15 NOTE — Telephone Encounter (Signed)
-----   Message from Jodelle Gross, NP sent at 09/14/2017 11:14 AM EDT ----- Please increase his hydralazine to 50 mg TID from BID. Continue BP recordings.   Thank you,  K

## 2017-09-18 ENCOUNTER — Ambulatory Visit (INDEPENDENT_AMBULATORY_CARE_PROVIDER_SITE_OTHER): Payer: Commercial Managed Care - PPO | Admitting: Adult Health

## 2017-09-18 ENCOUNTER — Encounter: Payer: Self-pay | Admitting: Adult Health

## 2017-09-18 VITALS — BP 160/102 | HR 84 | Wt 209.0 lb

## 2017-09-18 DIAGNOSIS — I10 Essential (primary) hypertension: Secondary | ICD-10-CM

## 2017-09-18 DIAGNOSIS — R0789 Other chest pain: Secondary | ICD-10-CM | POA: Diagnosis not present

## 2017-09-18 MED ORDER — HYDRALAZINE HCL 50 MG PO TABS: 50.0000 mg | ORAL_TABLET | Freq: Three times a day (TID) | ORAL | 3 refills | Status: DC

## 2017-09-18 MED ORDER — SPIRONOLACTONE 25 MG PO TABS: 12.5000 mg | ORAL_TABLET | Freq: Every day | ORAL | 6 refills | Status: DC
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
## 2017-09-18 NOTE — Patient Instructions (Signed)
Medication Instructions:  Your physician has recommended you make the following change in your medication:  Hydralazine 50 mg Three times daily  Start Spironolactone 12.5 mg Daily    Labwork: NONE   Testing/Procedures: NONE    Follow-Up: Your physician recommends that you schedule a follow-up appointment in: 1 Week for a Blood Pressure Check   Your physician recommends that you schedule a follow-up appointment in: 1 Month with Joni Reining, DNP     Any Other Special Instructions Will Be Listed Below (If Applicable).     If you need a refill on your cardiac medications before your next appointment, please call your pharmacy. Thank you for choosing [facility]!

## 2017-09-25 ENCOUNTER — Ambulatory Visit: Payer: Commercial Managed Care - PPO

## 2017-10-02 ENCOUNTER — Ambulatory Visit (INDEPENDENT_AMBULATORY_CARE_PROVIDER_SITE_OTHER): Payer: Commercial Managed Care - PPO

## 2017-10-02 VITALS — BP 146/98 | HR 89 | Wt 209.0 lb

## 2017-10-02 DIAGNOSIS — I10 Essential (primary) hypertension: Secondary | ICD-10-CM

## 2017-10-02 NOTE — Progress Notes (Signed)
BP check today : 146/98, HR 89 , Patient and wife stated that they want new medications for patient's blood pressure.They are not interested in increasing the medication he is on. They gave me his BP log which I will leave in Dr.Lawrence's folder to review

## 2017-10-02 NOTE — Patient Instructions (Signed)
We will call you on Monday with instructions.Continue all medications for now        Thank you for choosing [facility] !

## 2017-10-05 ENCOUNTER — Telehealth: Payer: Self-pay

## 2017-10-05 NOTE — Telephone Encounter (Signed)
Apt made with Dr.Koneswaran in 2 days on  Wed 10/31 @ 3 pm. Patient states he has to have wife call me to confirm the apt. I will await her call

## 2017-10-05 NOTE — Telephone Encounter (Signed)
Wife called to cancel apt with cardiologist.They want pcp Dr.Kim to manage as wife works for him.She will let me know if things change

## 2017-10-05 NOTE — Telephone Encounter (Signed)
-----   Message from Jodelle GrossKathryn M Lawrence, NP sent at 10/04/2017  2:10 PM EDT ----- Thank you. He will need to have follow up with cardiologist. I think he will need further titration but this is a very good BP compared to the others. He is frustrated about being on multiple medications.   ----- Message ----- From: Nori Riisarlton, Catherine A, RN Sent: 10/02/2017   4:07 PM To: Jodelle GrossKathryn M Lawrence, NP

## 2017-10-06 DIAGNOSIS — I10 Essential (primary) hypertension: Secondary | ICD-10-CM | POA: Diagnosis not present

## 2017-10-06 DIAGNOSIS — K219 Gastro-esophageal reflux disease without esophagitis: Secondary | ICD-10-CM | POA: Diagnosis not present

## 2017-10-06 DIAGNOSIS — G4700 Insomnia, unspecified: Secondary | ICD-10-CM | POA: Diagnosis not present

## 2017-10-07 ENCOUNTER — Ambulatory Visit: Payer: Commercial Managed Care - PPO | Admitting: Cardiovascular Disease

## 2017-10-08 ENCOUNTER — Ambulatory Visit (INDEPENDENT_AMBULATORY_CARE_PROVIDER_SITE_OTHER): Payer: Commercial Managed Care - PPO | Admitting: Internal Medicine

## 2017-10-08 ENCOUNTER — Telehealth (INDEPENDENT_AMBULATORY_CARE_PROVIDER_SITE_OTHER): Payer: Self-pay | Admitting: Internal Medicine

## 2017-10-08 NOTE — Telephone Encounter (Signed)
Someone called, lmoam that the patient was not going to be able to make his appointment today.  They will call back to reschedule.  She apologized for the short notice.

## 2017-10-21 DIAGNOSIS — M25561 Pain in right knee: Secondary | ICD-10-CM | POA: Diagnosis not present

## 2017-10-21 DIAGNOSIS — I10 Essential (primary) hypertension: Secondary | ICD-10-CM | POA: Diagnosis not present

## 2017-10-23 ENCOUNTER — Ambulatory Visit: Payer: Commercial Managed Care - PPO | Admitting: Adult Health

## 2017-11-19 DIAGNOSIS — I10 Essential (primary) hypertension: Secondary | ICD-10-CM | POA: Diagnosis not present

## 2017-11-19 DIAGNOSIS — Z0000 Encounter for general adult medical examination without abnormal findings: Secondary | ICD-10-CM | POA: Diagnosis not present

## 2017-11-19 DIAGNOSIS — Z125 Encounter for screening for malignant neoplasm of prostate: Secondary | ICD-10-CM | POA: Diagnosis not present

## 2017-12-08 DIAGNOSIS — I671 Cerebral aneurysm, nonruptured: Secondary | ICD-10-CM

## 2017-12-08 HISTORY — DX: Cerebral aneurysm, nonruptured: I67.1

## 2017-12-08 HISTORY — PX: OTHER SURGICAL HISTORY: SHX169

## 2018-02-05 DIAGNOSIS — R05 Cough: Secondary | ICD-10-CM | POA: Diagnosis not present

## 2018-02-05 DIAGNOSIS — I10 Essential (primary) hypertension: Secondary | ICD-10-CM | POA: Diagnosis not present

## 2018-02-05 DIAGNOSIS — J329 Chronic sinusitis, unspecified: Secondary | ICD-10-CM | POA: Diagnosis not present

## 2018-04-16 DIAGNOSIS — H25812 Combined forms of age-related cataract, left eye: Secondary | ICD-10-CM | POA: Diagnosis not present

## 2018-04-16 DIAGNOSIS — Z01818 Encounter for other preprocedural examination: Secondary | ICD-10-CM | POA: Diagnosis not present

## 2018-04-30 DIAGNOSIS — H25812 Combined forms of age-related cataract, left eye: Secondary | ICD-10-CM | POA: Diagnosis not present

## 2018-04-30 DIAGNOSIS — H2512 Age-related nuclear cataract, left eye: Secondary | ICD-10-CM | POA: Diagnosis not present

## 2018-08-01 ENCOUNTER — Emergency Department (HOSPITAL_COMMUNITY): Payer: Commercial Managed Care - PPO

## 2018-08-01 ENCOUNTER — Encounter (HOSPITAL_COMMUNITY): Payer: Self-pay | Admitting: Emergency Medicine

## 2018-08-01 ENCOUNTER — Other Ambulatory Visit: Payer: Self-pay

## 2018-08-01 ENCOUNTER — Emergency Department (HOSPITAL_COMMUNITY)
Admission: EM | Admit: 2018-08-01 | Discharge: 2018-08-02 | Disposition: A | Discharge status: Home / Self Care | Payer: Commercial Managed Care - PPO | Attending: Emergency Medicine | Admitting: Emergency Medicine

## 2018-08-01 DIAGNOSIS — Z7982 Long term (current) use of aspirin: Secondary | ICD-10-CM | POA: Insufficient documentation

## 2018-08-01 DIAGNOSIS — W11XXXA Fall on and from ladder, initial encounter: Secondary | ICD-10-CM | POA: Insufficient documentation

## 2018-08-01 DIAGNOSIS — S3982XA Other specified injuries of lower back, initial encounter: Secondary | ICD-10-CM | POA: Diagnosis present

## 2018-08-01 DIAGNOSIS — S80812A Abrasion, left lower leg, initial encounter: Secondary | ICD-10-CM | POA: Diagnosis not present

## 2018-08-01 DIAGNOSIS — M545 Low back pain: Secondary | ICD-10-CM | POA: Diagnosis not present

## 2018-08-01 DIAGNOSIS — Y9389 Activity, other specified: Secondary | ICD-10-CM | POA: Diagnosis not present

## 2018-08-01 DIAGNOSIS — Z87891 Personal history of nicotine dependence: Secondary | ICD-10-CM | POA: Diagnosis not present

## 2018-08-01 DIAGNOSIS — Z23 Encounter for immunization: Secondary | ICD-10-CM | POA: Insufficient documentation

## 2018-08-01 DIAGNOSIS — S80811A Abrasion, right lower leg, initial encounter: Secondary | ICD-10-CM | POA: Diagnosis not present

## 2018-08-01 DIAGNOSIS — S3992XA Unspecified injury of lower back, initial encounter: Secondary | ICD-10-CM | POA: Diagnosis not present

## 2018-08-01 DIAGNOSIS — S39012A Strain of muscle, fascia and tendon of lower back, initial encounter: Secondary | ICD-10-CM | POA: Diagnosis not present

## 2018-08-01 DIAGNOSIS — I671 Cerebral aneurysm, nonruptured: Secondary | ICD-10-CM

## 2018-08-01 DIAGNOSIS — M542 Cervicalgia: Secondary | ICD-10-CM | POA: Diagnosis not present

## 2018-08-01 DIAGNOSIS — Y999 Unspecified external cause status: Secondary | ICD-10-CM | POA: Diagnosis not present

## 2018-08-01 DIAGNOSIS — Y929 Unspecified place or not applicable: Secondary | ICD-10-CM | POA: Insufficient documentation

## 2018-08-01 DIAGNOSIS — R51 Headache: Secondary | ICD-10-CM | POA: Diagnosis not present

## 2018-08-01 DIAGNOSIS — Z79899 Other long term (current) drug therapy: Secondary | ICD-10-CM | POA: Diagnosis not present

## 2018-08-01 DIAGNOSIS — I10 Essential (primary) hypertension: Secondary | ICD-10-CM | POA: Insufficient documentation

## 2018-08-01 DIAGNOSIS — T148XXA Other injury of unspecified body region, initial encounter: Secondary | ICD-10-CM

## 2018-08-01 DIAGNOSIS — S0990XA Unspecified injury of head, initial encounter: Secondary | ICD-10-CM | POA: Diagnosis not present

## 2018-08-01 DIAGNOSIS — S199XXA Unspecified injury of neck, initial encounter: Secondary | ICD-10-CM | POA: Diagnosis not present

## 2018-08-01 DIAGNOSIS — W19XXXA Unspecified fall, initial encounter: Secondary | ICD-10-CM

## 2018-08-01 LAB — I-STAT CHEM 8, ED
BUN: 24 mg/dL — ABNORMAL HIGH (ref 8–23)
CALCIUM ION: 1.11 mmol/L — AB (ref 1.15–1.40)
Chloride: 105 mmol/L (ref 98–111)
Creatinine, Ser: 0.9 mg/dL (ref 0.61–1.24)
Glucose, Bld: 90 mg/dL (ref 70–99)
HCT: 44 % (ref 39.0–52.0)
HEMOGLOBIN: 15 g/dL (ref 13.0–17.0)
Potassium: 3.8 mmol/L (ref 3.5–5.1)
SODIUM: 141 mmol/L (ref 135–145)
TCO2: 25 mmol/L (ref 22–32)

## 2018-08-01 MED ORDER — HYDROMORPHONE HCL 1 MG/ML IJ SOLN
1.0000 mg | Freq: Once | INTRAMUSCULAR | Status: AC
Start: 2018-08-01 — End: 2018-08-01
  Administered 2018-08-01: 1 mg via INTRAVENOUS
  Filled 2018-08-01: qty 1

## 2018-08-01 MED ORDER — ONDANSETRON HCL 4 MG/2ML IJ SOLN
4.0000 mg | Freq: Once | INTRAMUSCULAR | Status: AC
  Administered 2018-08-01: 4 mg via INTRAVENOUS
  Filled 2018-08-01: qty 2

## 2018-08-01 MED ORDER — IOPAMIDOL (ISOVUE-370) INJECTION 76%
75.0000 mL | Freq: Once | INTRAVENOUS | Status: AC | PRN
  Administered 2018-08-01: 23:00:00 via INTRAVENOUS

## 2018-08-01 MED ORDER — SODIUM CHLORIDE 0.9 % IV SOLN
INTRAVENOUS | Status: DC
  Administered 2018-08-01: 23:00:00 via INTRAVENOUS

## 2018-08-01 NOTE — ED Triage Notes (Signed)
Pt fell off a ladder at approx 1.5 stories high, falling onto his back into a flower bed. Was able to get up and walk immediately after. C/o back pain.

## 2018-08-02 MED ORDER — TETANUS-DIPHTH-ACELL PERTUSSIS 5-2.5-18.5 LF-MCG/0.5 IM SUSP
0.5000 mL | Freq: Once | INTRAMUSCULAR | Status: AC
  Administered 2018-08-02: 0.5 mL via INTRAMUSCULAR
  Filled 2018-08-02: qty 0.5

## 2018-08-02 MED ORDER — HYDROCODONE-ACETAMINOPHEN 5-325 MG PO TABS: 1.0000 | ORAL_TABLET | Freq: Four times a day (QID) | ORAL | 0 refills | Status: DC | PRN

## 2018-08-02 NOTE — ED Provider Notes (Signed)
[facility]Y [facility]MENT Provider Note   CSN: [id number] Arrival date & time: 08/01/18  1827     History   Chief Complaint Chief Complaint  Patient presents with  . Back Pain    HPI Ralph Marquez is a 62 y.o. male.  Patient with a fall from a ladder.  Was working on placing a metal roof.  He fell approximately one-story.  Landed on his back and buttocks area.  Scraped his legs on the way down.  Did not hit his head no loss of consciousness.  Patient's complaint is lumbar back pain.  No chest pain no shortness of breath no abdominal pain.  No upper extremity pain no lower extremity pain.  Patient had the fall occurred at about 1430 in the afternoon laid down started to get a lot of discomfort in the back area.  Arrived here at about 1830.  Patient's tetanus not up-to-date.     Past Medical History:  Diagnosis Date  . Hypertension     Patient Active Problem List   Diagnosis Date Noted  . Chest pain 07/28/2017    Past Surgical History:  Procedure Laterality Date  . bone spur shoulder    . ESOPHAGOGASTRODUODENOSCOPY N/A 07/30/2017   Procedure: ESOPHAGOGASTRODUODENOSCOPY (EGD) Possible esophageal dilation.;  Surgeon: Malissa Hippoehman, Najeeb U, MD;  Location: [facility];  Service: Endoscopy;  Laterality: N/A;  . REPLACEMENT TOTAL KNEE          Home Medications    Prior to Admission medications   Medication Sig Start Date End Date Taking? Authorizing Provider  DILTIAZEM HCL PO Take by mouth.   Yes [provider]  pantoprazole (PROTONIX) 40 MG tablet Take 1 tablet (40 mg total) by mouth 2 (two) times daily before a meal. 07/31/17  Yes Philip AspenHernandez Acosta, Limmie PatriciaEstela Y, MD  spironolactone (ALDACTONE) 25 MG tablet Take 0.5 tablets (12.5 mg total) by mouth daily. 09/18/17 08/01/18 Yes Jodelle GrossLawrence, Kathryn M, NP  triazolam (HALCION) 0.25 MG tablet Take 0.25 mg by mouth at bedtime as needed for sleep.   Yes [provider]  aspirin 81 MG chewable tablet Chew 1 tablet  (81 mg total) by mouth daily. Patient not taking: Reported on 08/01/2018 07/31/17   Philip AspenHernandez Acosta, Limmie PatriciaEstela Y, MD  hydrALAZINE (APRESOLINE) 50 MG tablet Take 1 tablet (50 mg total) by mouth 3 (three) times daily. 09/18/17 12/17/17  Jodelle GrossLawrence, Kathryn M, NP  HYDROcodone-acetaminophen (NORCO/VICODIN) 5-325 MG tablet Take 1-2 tablets by mouth every 6 (six) hours as needed. 08/02/18   Vanetta MuldersZackowski, Boby Eyer, MD    Family History Family History  Problem Relation Age of Onset  . CAD Mother        CABG in 3260's  . CVA Sister     Social History Social History   Tobacco Use  . Smoking status: Former Smoker    Types: Cigarettes  . Smokeless tobacco: Never Used  . Tobacco comment: 40 yrs quit 3 yrs ago  Substance Use Topics  . Alcohol use: No  . Drug use: No     Allergies   Amlodipine; Lisinopril; Losartan potassium; and Iodine   Review of Systems Review of Systems  Constitutional: Negative for fever.  HENT: Negative for congestion.   Eyes: Negative for visual disturbance.  Respiratory: Negative for shortness of breath.   Cardiovascular: Negative for chest pain.  Gastrointestinal: Negative for abdominal pain.  Genitourinary: Negative for hematuria.  Musculoskeletal: Positive for back pain. Negative for neck pain.  Skin: Positive for wound.  Neurological: Negative  for syncope, weakness and headaches.  Hematological: Does not bruise/bleed easily.  Psychiatric/Behavioral: Negative for confusion.     Physical Exam Updated Vital Signs BP 138/84   Pulse 61   Temp 98 F (36.7 C) (Oral)   Resp 18   Ht 1.778 m (5\' 10" )   Wt 95.3 kg   SpO2 95%   BMI 30.13 kg/m   Physical Exam  Constitutional: He is oriented to person, place, and time. He appears well-developed and well-nourished. No distress.  HENT:  Head: Normocephalic and atraumatic.  Mouth/Throat: Oropharynx is clear and moist.  Eyes: Pupils are equal, round, and reactive to light. Conjunctivae and EOM are normal.  Neck: Normal  range of motion. Neck supple.  Patient had cervical collar placed in triage.  Cardiovascular: Normal rate, regular rhythm and normal heart sounds.  No murmur heard. Pulmonary/Chest: Effort normal and breath sounds normal. No respiratory distress.  Abdominal: Soft. Bowel sounds are normal. There is no tenderness.  Musculoskeletal: Normal range of motion.  Abrasions to bilateral anterior shins.  Also abrasions to left and right shoulder area.  Tenderness to palpation over the lumbar area.  No obvious deformity.  Neurological: He is alert and oriented to person, place, and time. No cranial nerve deficit or sensory deficit. He exhibits normal muscle tone. Coordination normal.  Skin: Skin is warm. Capillary refill takes less than 2 seconds.  Nursing note and vitals reviewed.    ED Treatments / Results  Labs (all labs ordered are listed, but only abnormal results are displayed) Labs Reviewed  I-STAT CHEM 8, ED - Abnormal; Notable for the following components:      Result Value   BUN 24 (*)    Calcium, Ion 1.11 (*)    All other components within normal limits    EKG None  Radiology Ct Angio Head W/cm &/or Wo Cm  Result Date: 08/02/2018 CLINICAL DATA:  62 y/o M; fall from ladder 1-1/2 stories high. Possible A-comm aneurysm. EXAM: CT ANGIOGRAPHY HEAD TECHNIQUE: Multidetector CT imaging of the head was performed using the standard protocol during bolus administration of intravenous contrast. Multiplanar CT image reconstructions and MIPs were obtained to evaluate the vascular anatomy. CONTRAST:  75 cc Isovue 370 COMPARISON:  08/01/2018 CT head. FINDINGS: CTA HEAD Anterior circulation: Anterior communicating artery aneurysm incorporating bilaterally A1/A2 junctions measuring 7.4 x 5.5 x 8.6 mm (AP x ML x CC series 6, image 56 and series 10, image 85). There are 1-2 mm outpouchings at the posterosuperior margin of the aneurysm (series 10, image 85) and the inferior margin of the aneurysm (series 8,  image 74) corresponding to diverticula of diminutive branch vessels. No additional aneurysm, segment of stenosis, large vessel occlusion, or vascular malformation. Posterior circulation: No significant stenosis, proximal occlusion, aneurysm, or vascular malformation. Venous sinuses: As permitted by contrast timing, patent. Anatomic variants: None significant. Delayed phase: No abnormal intracranial enhancement. IMPRESSION: Broad-based anterior communicating artery aneurysm, anteriorly directed, incorporating the bilateral A1/A2 junctions as well as diminutive branch vessels measuring up to 8.6 mm. Electronically Signed   By: Mitzi Hansen M.D.   On: 08/02/2018 00:33   Dg Lumbar Spine Complete  Result Date: 08/01/2018 CLINICAL DATA:  Pain after fall EXAM: LUMBAR SPINE - COMPLETE 4+ VIEW COMPARISON:  None. FINDINGS: No fracture or traumatic malalignment. 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 Lower lumbar facet degenerative changes. IMPRESSION: No fracture.  Degenerative changes. Electronically Signed   By: Gerome Sam III M.D  On: 08/01/2018 21:30   Ct Head Wo Contrast  Result Date: 08/01/2018 CLINICAL DATA:  Pain after fall EXAM: CT HEAD WITHOUT CONTRAST CT CERVICAL SPINE WITHOUT CONTRAST TECHNIQUE: Multidetector CT imaging of the head and cervical spine was performed following the standard protocol without intravenous contrast. Multiplanar CT image reconstructions of the cervical spine were also generated. COMPARISON:  None. FINDINGS: CT HEAD FINDINGS Brain: No subdural, epidural, or subarachnoid hemorrhage. Cerebellum, brainstem, and basal cisterns otherwise normal. Ventricles and sulci are normal. No mass effect or midline shift. No acute cortical ischemia or infarct. No other acute intracranial abnormalities. Vascular: Rounded high attenuation in the superior suprasellar cistern, best seen on coronal image 30 measures 8.3 by 6.3  mm, concerning for an aneurysm arising from the anterior circulation, possibly an A-comm aneurysm. No adjacent blood to suggest hemorrhage. Skull: Normal. Negative for fracture or focal lesion. Sinuses/Orbits: No acute finding. Other: None. CT CERVICAL SPINE FINDINGS Alignment: No significant malalignment. Skull base and vertebrae: No acute fracture. No primary bone lesion or focal pathologic process. Soft tissues and spinal canal: No prevertebral fluid or swelling. No visible canal hematoma. Disc levels:  Multilevel degenerative changes. Upper chest: Negative. Other: No other abnormalities. IMPRESSION: 1. An oval region of high attenuation in the superior suprasellar cistern measuring 8.3 x 6.3 mm is consistent with an anterior circulation aneurysm, probably an A-comm aneurysm. This is likely an incidental finding. There is no evidence of acute hemorrhage. Recommend a CTA for further evaluation. 2. No acute intracranial abnormality otherwise seen. 3. No fracture or traumatic malalignment in the cervical spine. Electronically Signed   By: Gerome Sam III M.D   On: 08/01/2018 21:50   Ct Cervical Spine Wo Contrast  Result Date: 08/01/2018 CLINICAL DATA:  Pain after fall EXAM: CT HEAD WITHOUT CONTRAST CT CERVICAL SPINE WITHOUT CONTRAST TECHNIQUE: Multidetector CT imaging of the head and cervical spine was performed following the standard protocol without intravenous contrast. Multiplanar CT image reconstructions of the cervical spine were also generated. COMPARISON:  None. FINDINGS: CT HEAD FINDINGS Brain: No subdural, epidural, or subarachnoid hemorrhage. Cerebellum, brainstem, and basal cisterns otherwise normal. Ventricles and sulci are normal. No mass effect or midline shift. No acute cortical ischemia or infarct. No other acute intracranial abnormalities. Vascular: Rounded high attenuation in the superior suprasellar cistern, best seen on coronal image 30 measures 8.3 by 6.3 mm, concerning for an aneurysm  arising from the anterior circulation, possibly an A-comm aneurysm. No adjacent blood to suggest hemorrhage. Skull: Normal. Negative for fracture or focal lesion. Sinuses/Orbits: No acute finding. Other: None. CT CERVICAL SPINE FINDINGS Alignment: No significant malalignment. Skull base and vertebrae: No acute fracture. No primary bone lesion or focal pathologic process. Soft tissues and spinal canal: No prevertebral fluid or swelling. No visible canal hematoma. Disc levels:  Multilevel degenerative changes. Upper chest: Negative. Other: No other abnormalities. IMPRESSION: 1. An oval region of high attenuation in the superior suprasellar cistern measuring 8.3 x 6.3 mm is consistent with an anterior circulation aneurysm, probably an A-comm aneurysm. This is likely an incidental finding. There is no evidence of acute hemorrhage. Recommend a CTA for further evaluation. 2. No acute intracranial abnormality otherwise seen. 3. No fracture or traumatic malalignment in the cervical spine. Electronically Signed   By: Gerome Sam III M.D   On: 08/01/2018 21:50    Procedures Procedures (including critical care time)  Medications Ordered in ED Medications  0.9 %  sodium chloride infusion ( Intravenous Stopped 08/02/18 0048)  ondansetron (ZOFRAN)  injection 4 mg (4 mg Intravenous Given 08/01/18 2252)  HYDROmorphone (DILAUDID) injection 1 mg (1 mg Intravenous Given 08/01/18 2252)  iopamidol (ISOVUE-370) 76 % injection 75 mL ( Intravenous Contrast Given 08/01/18 2312)  Tdap (BOOSTRIX) injection 0.5 mL (0.5 mLs Intramuscular Given 08/02/18 0059)     Initial Impression / Assessment and Plan / ED Course  I have reviewed the triage vital signs and the nursing notes.  Pertinent labs & imaging results that were available during my care of the patient were reviewed by me and considered in my medical decision making (see chart for details).    Patient improved here with pain medication.  X-rays of the lumbar back  without any bony injuries.  CT head and neck without any injuries.  However CT head raised concerns for possible aneurysm.  Etiology recommended CT angios.  Patient opted to do that this evening.  Did show a large anterior aneurysm.  Without any evidence of leakage.  Patient also asymptomatic from this.  Incidental finding.  Outpatient follow-up with neurosurgery and primary care provider.  Patient's tetanus updated here.  Patient's back pain from the fall will be treated symptomatically with hydrocodone.  And close follow-up with primary care provider.  Although quite a significant fall no evidence of any other significant internal injuries.  No abdominal tenderness no abdominal pain.   Final Clinical Impressions(s) / ED Diagnoses   Final diagnoses:  Fall, initial encounter  Strain of lumbar region, initial encounter  Abrasion  Brain aneurysm    ED Discharge Orders         Ordered    HYDROcodone-acetaminophen (NORCO/VICODIN) 5-325 MG tablet  Every 6 hours PRN     08/02/18 0100           Vanetta Mulders, MD 08/02/18 0159

## 2018-08-02 NOTE — Discharge Instructions (Signed)
Work-up for the fall from the ladder without any significant injuries.  Expect to be very sore and stiff for the next few days.  Take the hydrocodone as directed for pain.  Make an appointment to follow-up with your primary care provider regarding the incidental finding of the anterior brain aneurysm.  Also referral information to neurosurgery provided.  Tetanus was updated today.

## 2018-08-06 DIAGNOSIS — I671 Cerebral aneurysm, nonruptured: Secondary | ICD-10-CM | POA: Diagnosis not present

## 2018-08-06 DIAGNOSIS — Z87891 Personal history of nicotine dependence: Secondary | ICD-10-CM | POA: Diagnosis not present

## 2018-08-12 DIAGNOSIS — Z87891 Personal history of nicotine dependence: Secondary | ICD-10-CM | POA: Diagnosis not present

## 2018-08-12 DIAGNOSIS — I671 Cerebral aneurysm, nonruptured: Secondary | ICD-10-CM | POA: Diagnosis not present

## 2018-08-23 DIAGNOSIS — Z01818 Encounter for other preprocedural examination: Secondary | ICD-10-CM | POA: Diagnosis not present

## 2018-08-23 DIAGNOSIS — I671 Cerebral aneurysm, nonruptured: Secondary | ICD-10-CM | POA: Diagnosis not present

## 2018-08-23 DIAGNOSIS — K219 Gastro-esophageal reflux disease without esophagitis: Secondary | ICD-10-CM | POA: Diagnosis not present

## 2018-08-23 DIAGNOSIS — I10 Essential (primary) hypertension: Secondary | ICD-10-CM | POA: Diagnosis not present

## 2018-08-23 DIAGNOSIS — Z95828 Presence of other vascular implants and grafts: Secondary | ICD-10-CM | POA: Diagnosis not present

## 2018-08-25 DIAGNOSIS — I671 Cerebral aneurysm, nonruptured: Secondary | ICD-10-CM | POA: Insufficient documentation

## 2018-08-25 HISTORY — PX: CEREBRAL EMBOLIZATION: SHX1327

## 2018-08-30 DIAGNOSIS — T148XXA Other injury of unspecified body region, initial encounter: Secondary | ICD-10-CM | POA: Diagnosis not present

## 2018-08-30 DIAGNOSIS — I10 Essential (primary) hypertension: Secondary | ICD-10-CM | POA: Diagnosis not present

## 2018-08-30 DIAGNOSIS — I671 Cerebral aneurysm, nonruptured: Secondary | ICD-10-CM | POA: Diagnosis not present

## 2018-09-10 DIAGNOSIS — Z48812 Encounter for surgical aftercare following surgery on the circulatory system: Secondary | ICD-10-CM | POA: Diagnosis not present

## 2018-09-10 DIAGNOSIS — M1712 Unilateral primary osteoarthritis, left knee: Secondary | ICD-10-CM | POA: Diagnosis not present

## 2018-09-10 DIAGNOSIS — M79605 Pain in left leg: Secondary | ICD-10-CM | POA: Diagnosis not present

## 2018-09-10 DIAGNOSIS — I671 Cerebral aneurysm, nonruptured: Secondary | ICD-10-CM | POA: Diagnosis not present

## 2018-10-08 DIAGNOSIS — Z95828 Presence of other vascular implants and grafts: Secondary | ICD-10-CM | POA: Diagnosis not present

## 2018-10-08 DIAGNOSIS — I671 Cerebral aneurysm, nonruptured: Secondary | ICD-10-CM | POA: Diagnosis not present

## 2018-10-08 DIAGNOSIS — Z7982 Long term (current) use of aspirin: Secondary | ICD-10-CM | POA: Diagnosis not present

## 2019-02-18 DIAGNOSIS — I671 Cerebral aneurysm, nonruptured: Secondary | ICD-10-CM | POA: Diagnosis not present

## 2019-02-18 DIAGNOSIS — Z9889 Other specified postprocedural states: Secondary | ICD-10-CM | POA: Diagnosis not present

## 2019-03-11 DIAGNOSIS — M542 Cervicalgia: Secondary | ICD-10-CM | POA: Diagnosis not present

## 2019-03-11 DIAGNOSIS — F5101 Primary insomnia: Secondary | ICD-10-CM | POA: Diagnosis not present

## 2019-03-11 DIAGNOSIS — N61 Mastitis without abscess: Secondary | ICD-10-CM | POA: Diagnosis not present

## 2019-06-16 ENCOUNTER — Other Ambulatory Visit: Payer: Self-pay | Admitting: Internal Medicine

## 2019-06-16 DIAGNOSIS — M542 Cervicalgia: Secondary | ICD-10-CM

## 2019-06-24 ENCOUNTER — Ambulatory Visit
Admission: RE | Admit: 2019-06-24 | Discharge: 2019-06-24 | Disposition: A | Discharge status: Home / Self Care | Payer: Commercial Managed Care - PPO | Source: Ambulatory Visit | Attending: Internal Medicine | Admitting: Internal Medicine

## 2019-06-24 DIAGNOSIS — M542 Cervicalgia: Secondary | ICD-10-CM

## 2019-06-24 MED ORDER — IOPAMIDOL (ISOVUE-M 300) INJECTION 61%
1.0000 mL | Freq: Once | INTRAMUSCULAR | Status: AC | PRN
  Administered 2019-06-24: 13:00:00 1 mL via EPIDURAL

## 2019-06-24 MED ORDER — TRIAMCINOLONE ACETONIDE 40 MG/ML IJ SUSP (RADIOLOGY)
60.0000 mg | Freq: Once | INTRAMUSCULAR | Status: AC
  Administered 2019-06-24: 13:00:00 60 mg via EPIDURAL

## 2019-06-24 NOTE — Discharge Instructions (Signed)

## 2019-10-17 IMAGING — CT CT ANGIO HEAD
3 of 8 series · 16 of 47 positions shown · IV contrast (Isovue)
Comparison: 08/01/2018 CT head.

CLINICAL DATA: 62 y/o M; fall from ladder 1-1/2 stories high.
Possible A-comm aneurysm.

EXAM:
CT ANGIOGRAPHY HEAD
TECHNIQUE: Multidetector CT imaging of the head was performed using the
standard protocol during bolus administration of intravenous
contrast. Multiplanar CT image reconstructions and MIPs were
obtained to evaluate the vascular anatomy.
CONTRAST:  75 cc Isovue 370

[Series 6: ax thin · axial · 0.39mm/px · z∈[+235,+367]mm · 10 of 158 slices shown]
[im 11/158  brain]
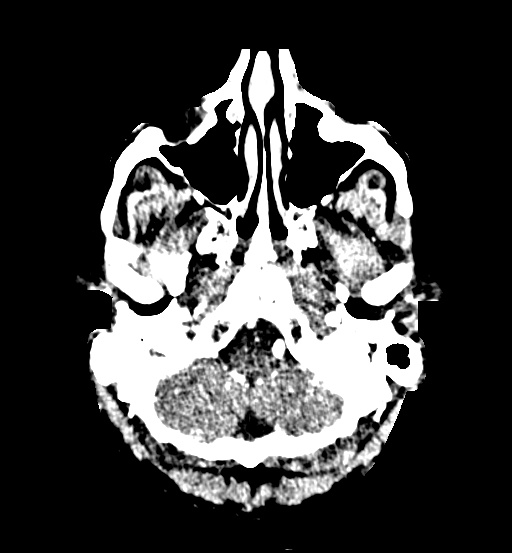
[im 32/158  bone]
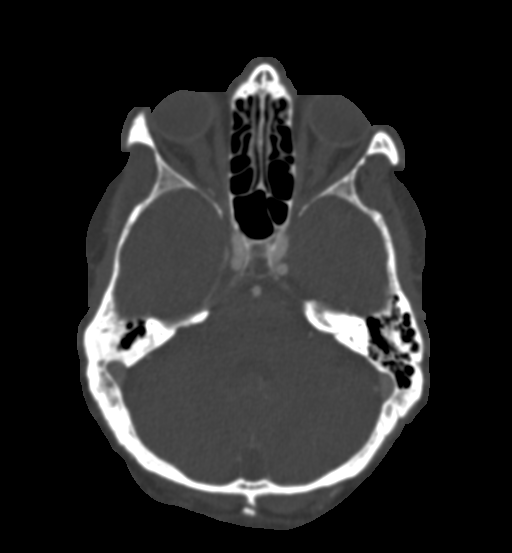
[im 42/158  brain]
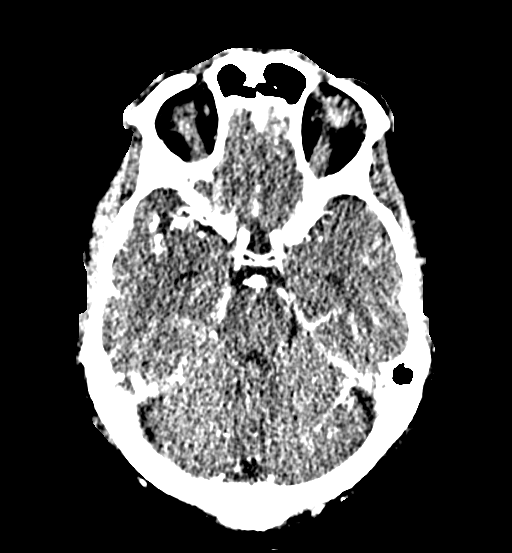
[im 53/158  bone]
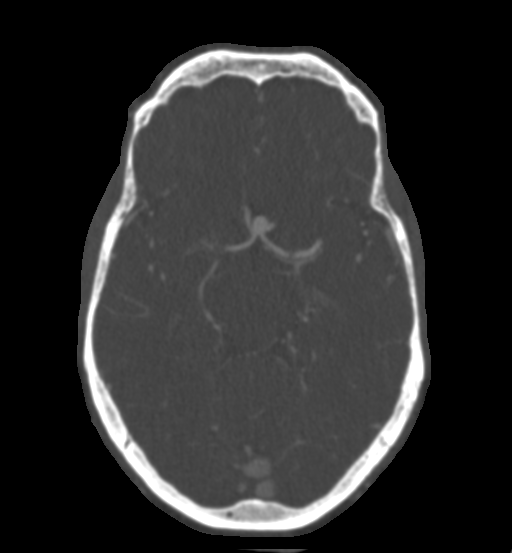
[im 74/158  brain]
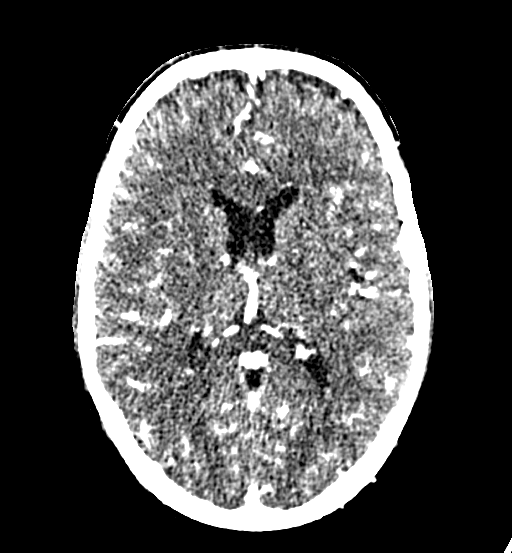
[im 84/158  bone]
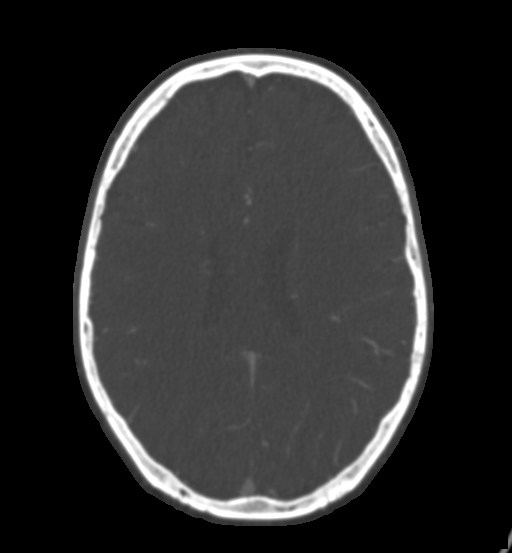
[im 105/158  brain]
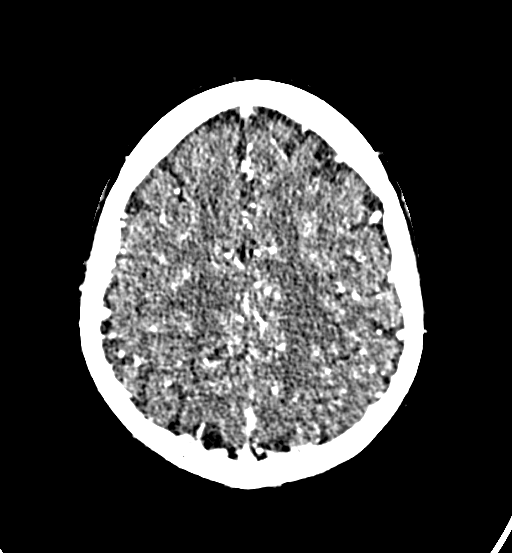
[im 116/158  bone]
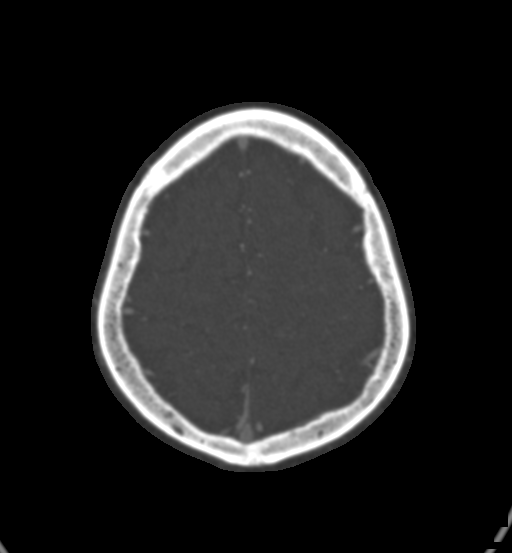
[im 126/158  brain]
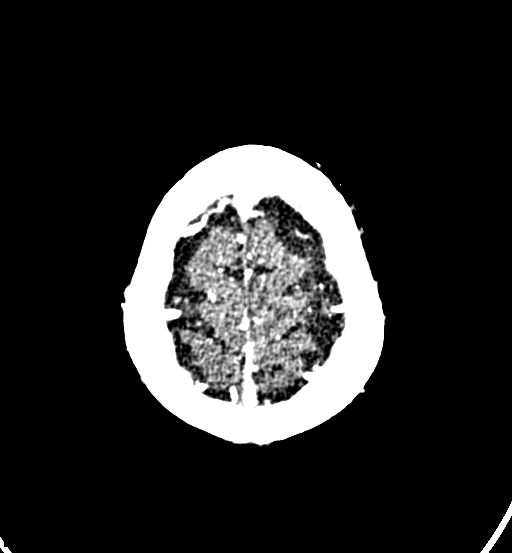
[im 147/158  bone]
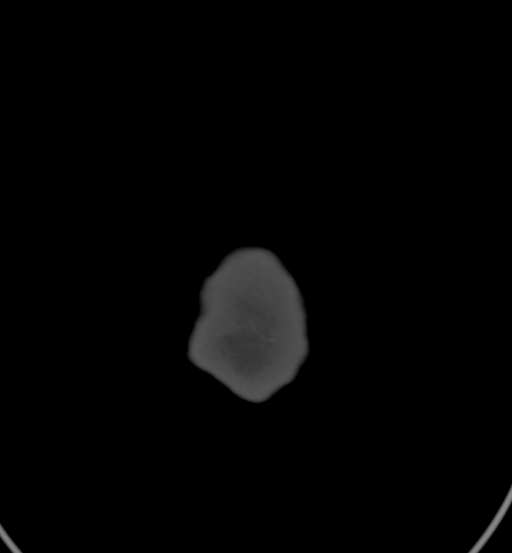

[Series 8: cor thin · coronal · 0.28mm/px · 3 of 196 slices shown]
[im 56/196  brain]
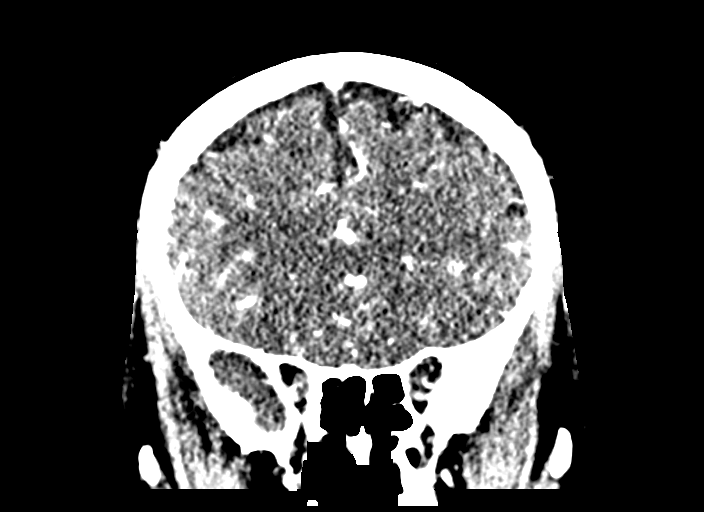
[im 84/196  brain]
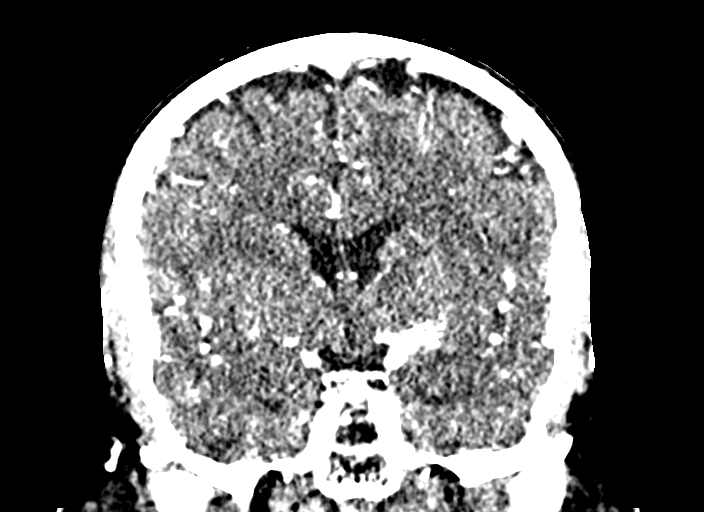
[im 112/196  brain]
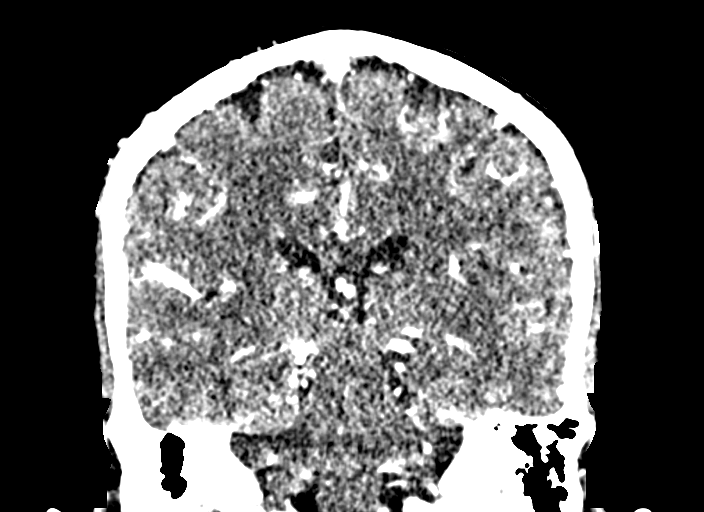

[Series 10: sag thin · sagittal · 0.32mm/px · 3 of 163 slices shown]
[im 33/163  brain]
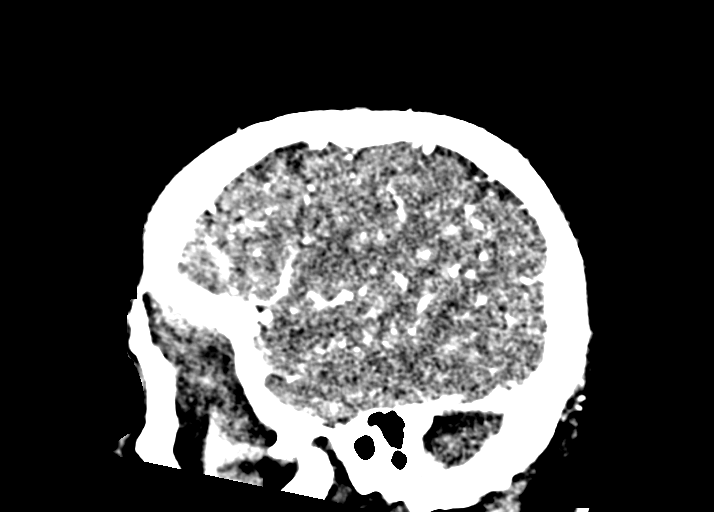
[im 65/163  brain]
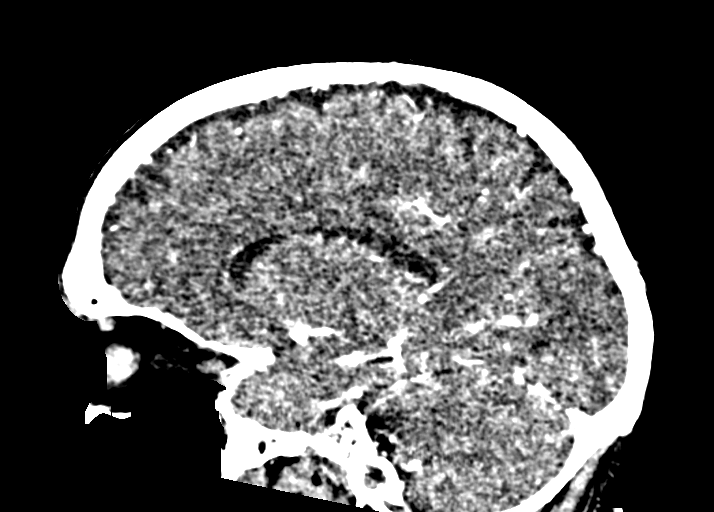
[im 98/163  brain]
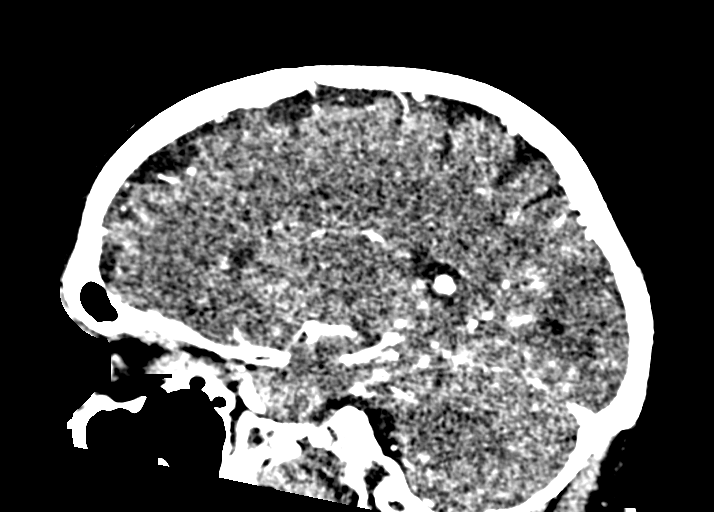

[16 of 47 positions shown; findings below may reference images not displayed]

FINDINGS: CTA HEAD

Anterior circulation: Anterior communicating artery aneurysm
incorporating bilaterally A1/A2 junctions measuring 7.4 x 5.5 x
mm (AP x ML x CC series 6, image 56 and series 10, image 85). There
are 1-2 mm outpouchings at the posterosuperior margin of the
aneurysm (series 10, image 85) and the inferior margin of the
aneurysm (series 8, image 74) corresponding to diverticula of
diminutive branch vessels.

No additional aneurysm, segment of stenosis, large vessel occlusion,
or vascular malformation.

Posterior circulation: No significant stenosis, proximal occlusion,
aneurysm, or vascular malformation.

Venous sinuses: As permitted by contrast timing, patent.

Anatomic variants: None significant.

Delayed phase: No abnormal intracranial enhancement.
IMPRESSION: Broad-based anterior communicating artery aneurysm, anteriorly
directed, incorporating the bilateral A1/A2 junctions as well as
diminutive branch vessels measuring up to 8.6 mm.

By: Diosny La Grace M.D.

## 2019-10-17 IMAGING — CT CT CERVICAL SPINE W/O CM
4 of 7 series · 14 of 33 positions shown, 15 images · non-contrast
Comparison: None.

CLINICAL DATA: Pain after fall

EXAM:
CT HEAD WITHOUT CONTRAST
CT CERVICAL SPINE WITHOUT CONTRAST
TECHNIQUE: Multidetector CT imaging of the head and cervical spine was
performed following the standard protocol without intravenous
contrast. Multiplanar CT image reconstructions of the cervical spine
were also generated.

[Series 8: c spine soft · axial · 0.31mm/px · z∈[+42,+144]mm · 4 of 85 slices shown]
[im 17/85  soft-tissue]
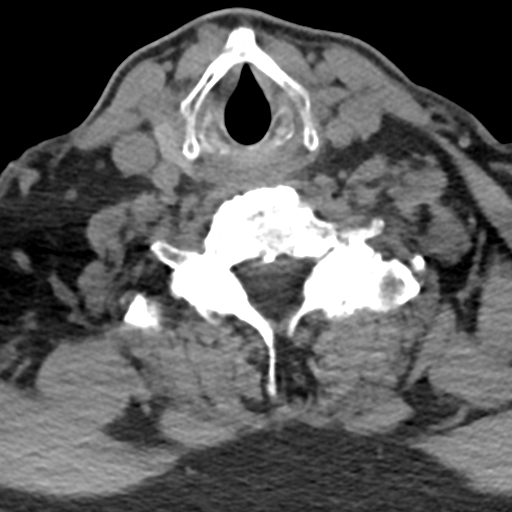
[im 34/85  soft-tissue]
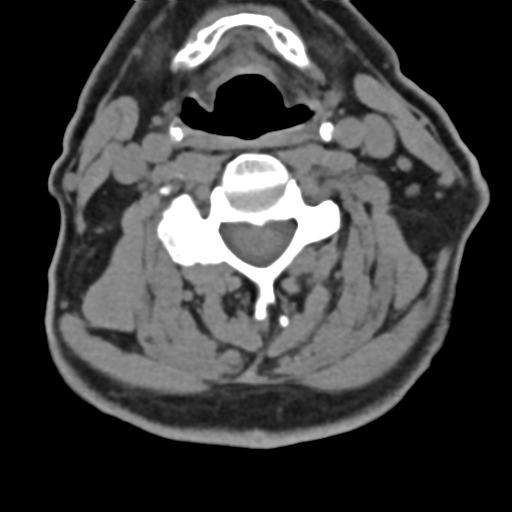
[im 51/85  soft-tissue]
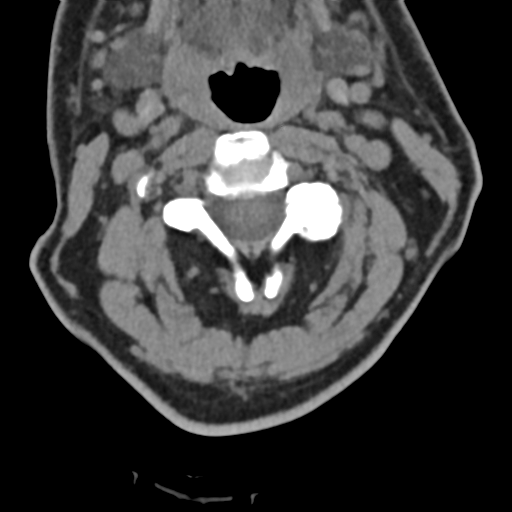
[im 68/85  soft-tissue]
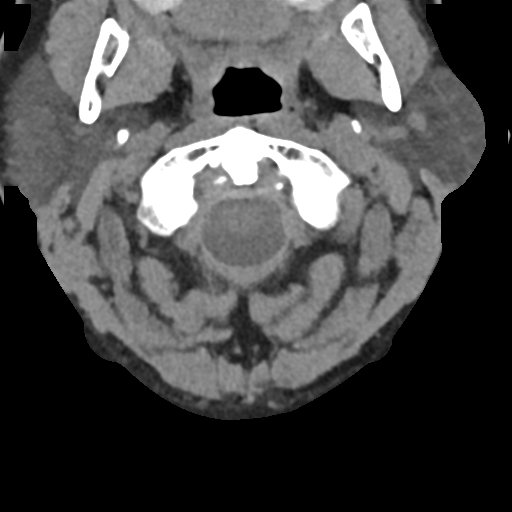

[Series 11: sagittal bone · sagittal · 0.24mm/px · 5 of 61 slices shown]
[im 11/61  bone]
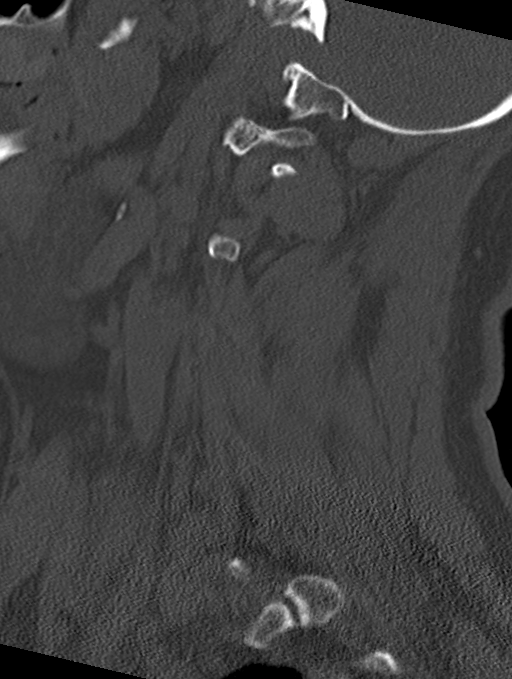
[im 21/61  bone]
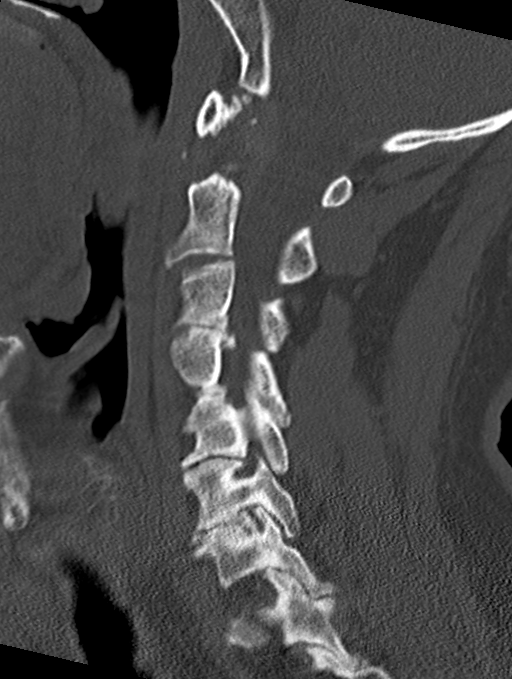
[im 31/61  bone]
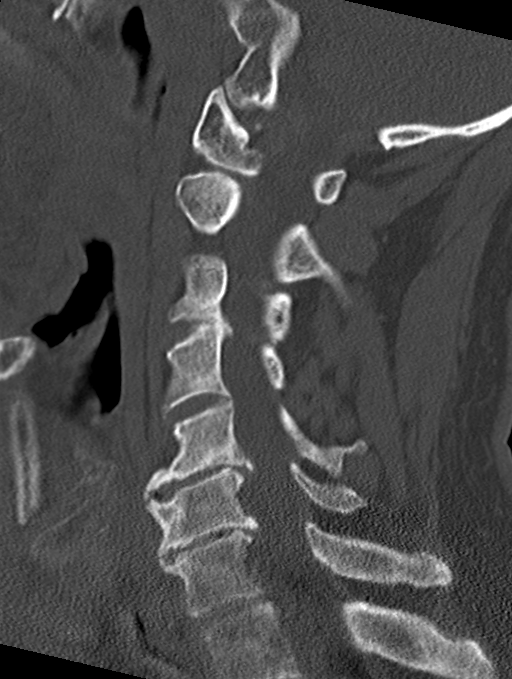
[im 41/61  bone]
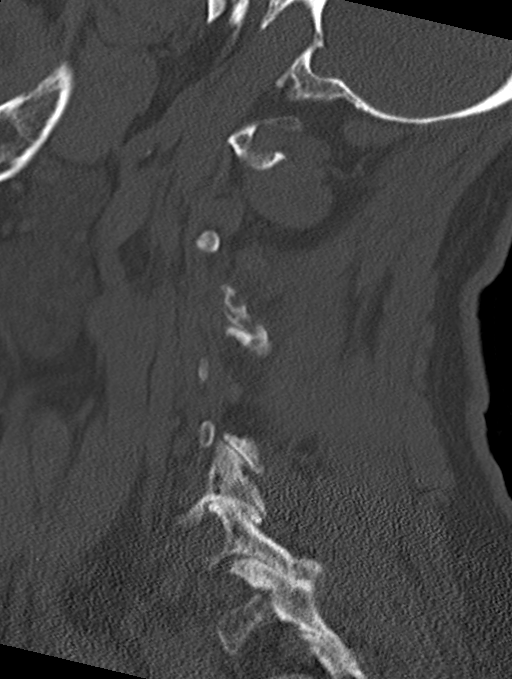
[im 51/61  bone]
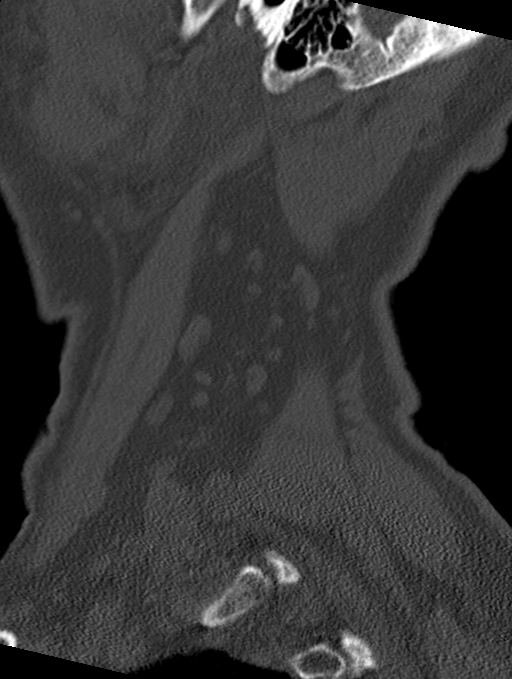

[Series 12: coronal bone · coronal · 0.24mm/px · 1 of 61 slices shown]
[im 31/61  bone]
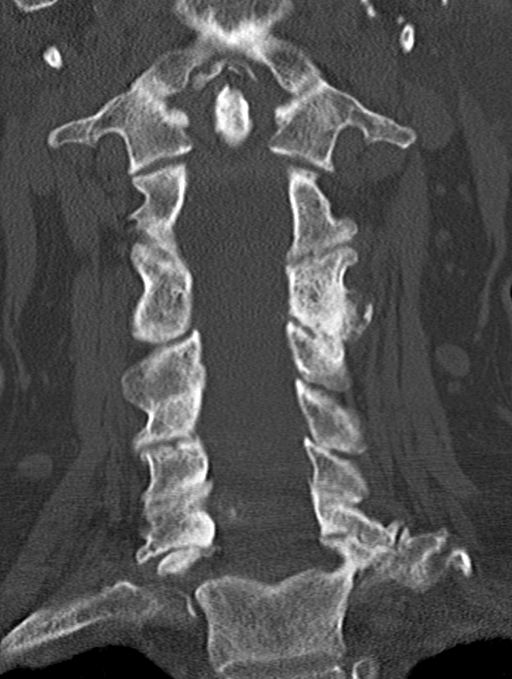

[Series 13: orthogonal bone · axial · 0.21mm/px · z∈[+22,+119]mm · 4 of 81 slices shown, 5 images]
[im 17/81  soft-tissue]
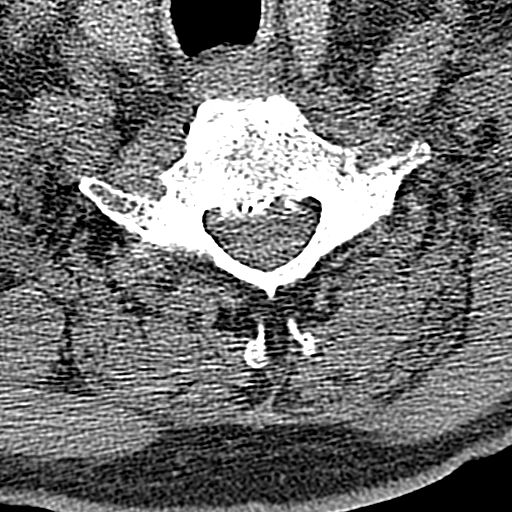
[im 17/81  bone]
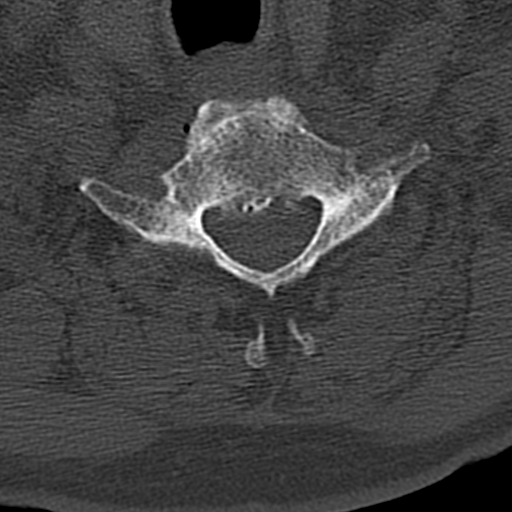
[im 33/81  bone]
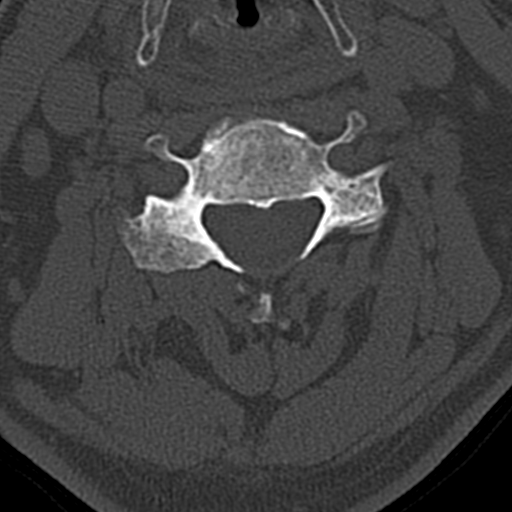
[im 49/81  bone]
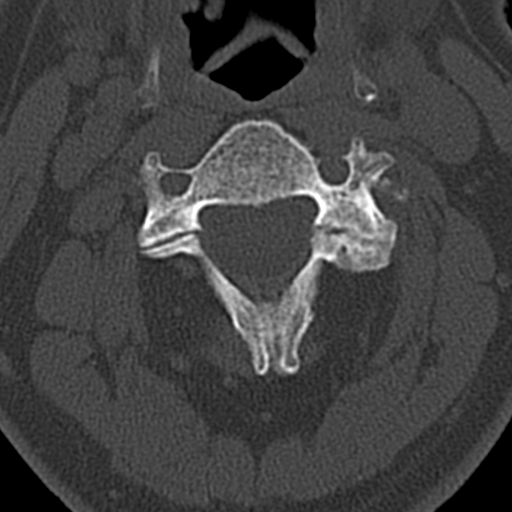
[im 65/81  bone]
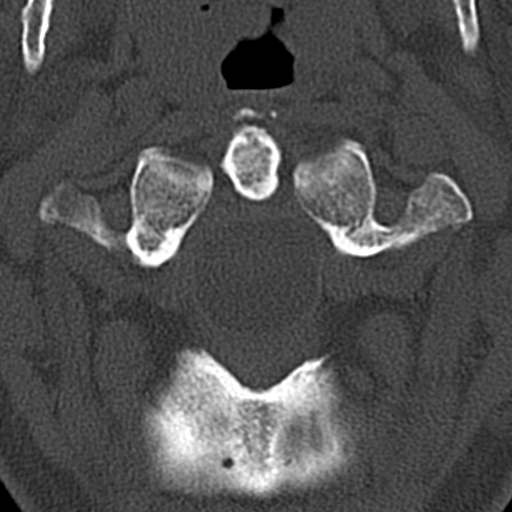

[14 of 33 positions shown; findings below may reference images not displayed]

FINDINGS: CT HEAD FINDINGS

Brain: No subdural, epidural, or subarachnoid hemorrhage.
Cerebellum, brainstem, and basal cisterns otherwise normal.
Ventricles and sulci are normal. No mass effect or midline shift. No
acute cortical ischemia or infarct. No other acute intracranial
abnormalities.

Vascular: Rounded high attenuation in the superior suprasellar
cistern, best seen on coronal image 30 measures 8.3 by 6.3 mm,
concerning for an aneurysm arising from the anterior circulation,
possibly an A-comm aneurysm. No adjacent blood to suggest
hemorrhage.

Skull: Normal. Negative for fracture or focal lesion.

Sinuses/Orbits: No acute finding.

Other: None.

CT CERVICAL SPINE FINDINGS

Alignment: No significant malalignment.

Skull base and vertebrae: No acute fracture. No primary bone lesion
or focal pathologic process.

Soft tissues and spinal canal: No prevertebral fluid or swelling. No
visible canal hematoma.

Disc levels:  Multilevel degenerative changes.

Upper chest: Negative.

Other: No other abnormalities.
IMPRESSION: 1. An oval region of high attenuation in the superior suprasellar
cistern measuring 8.3 x 6.3 mm is consistent with an anterior
circulation aneurysm, probably an A-comm aneurysm. This is likely an
incidental finding. There is no evidence of acute hemorrhage.
Recommend a CTA for further evaluation.
2. No acute intracranial abnormality otherwise seen.
3. No fracture or traumatic malalignment in the cervical spine.

## 2019-10-17 IMAGING — DX DG LUMBAR SPINE COMPLETE 4+V
5 series · 5 of 5 positions shown · non-contrast
Comparison: None.

CLINICAL DATA: Pain after fall

EXAM:
LUMBAR SPINE - COMPLETE 4+ VIEW

[l-spine ap]
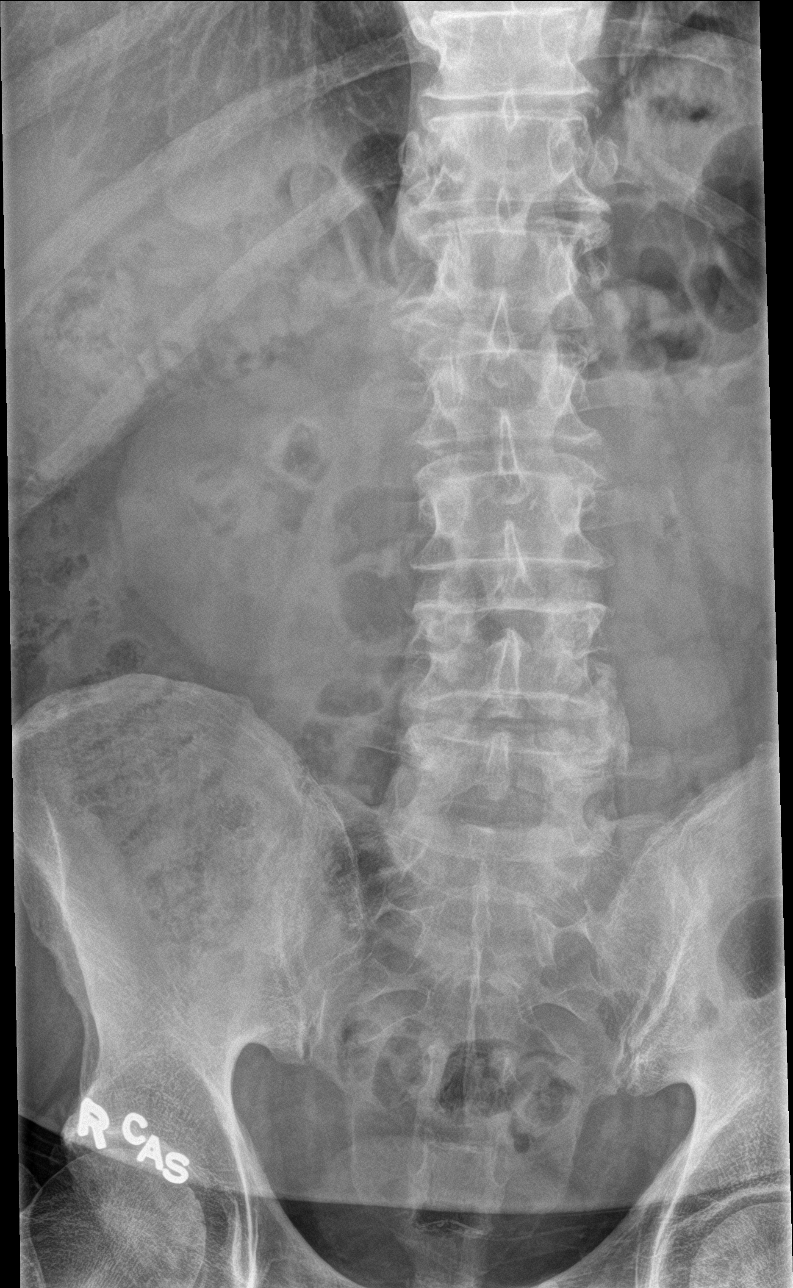

[l-spine obl (1 of 2)]
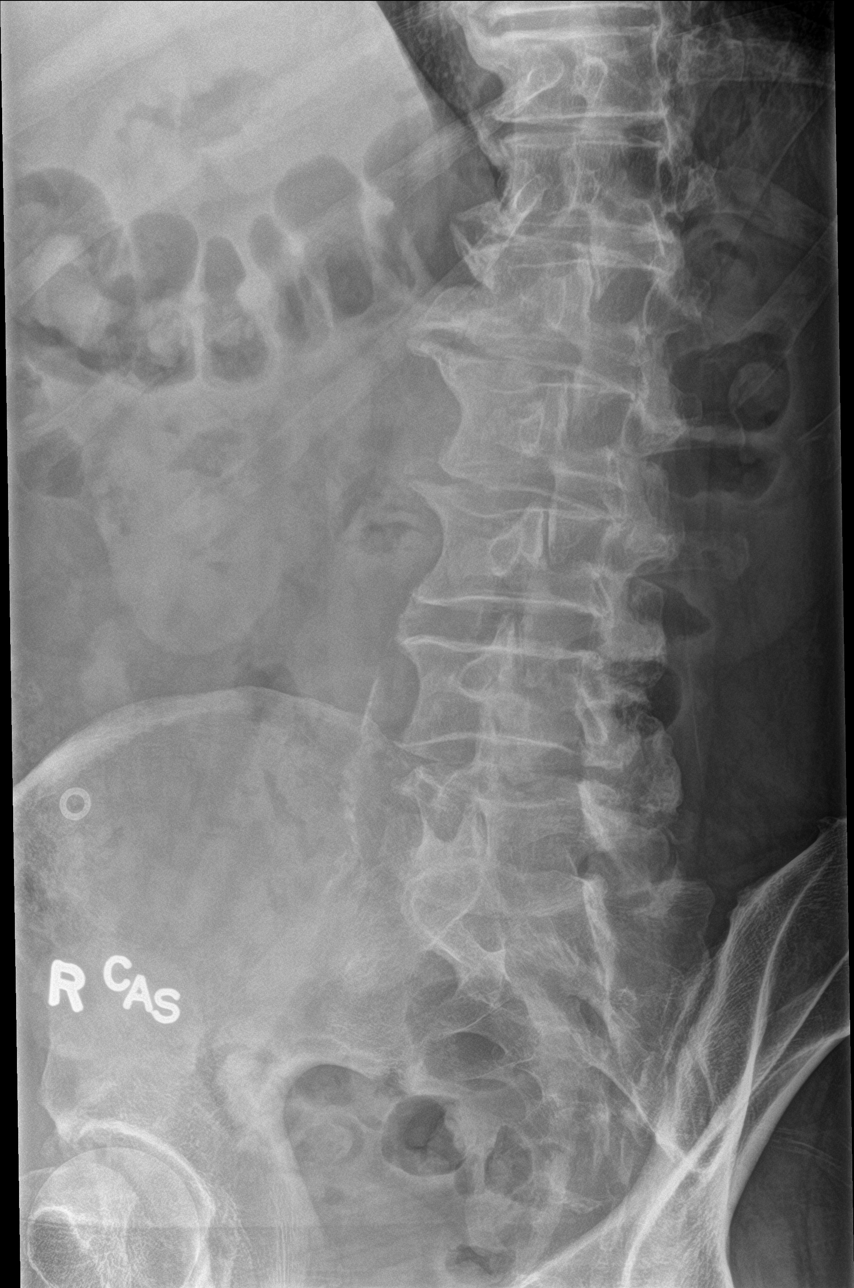

[l-spine obl (2 of 2)]
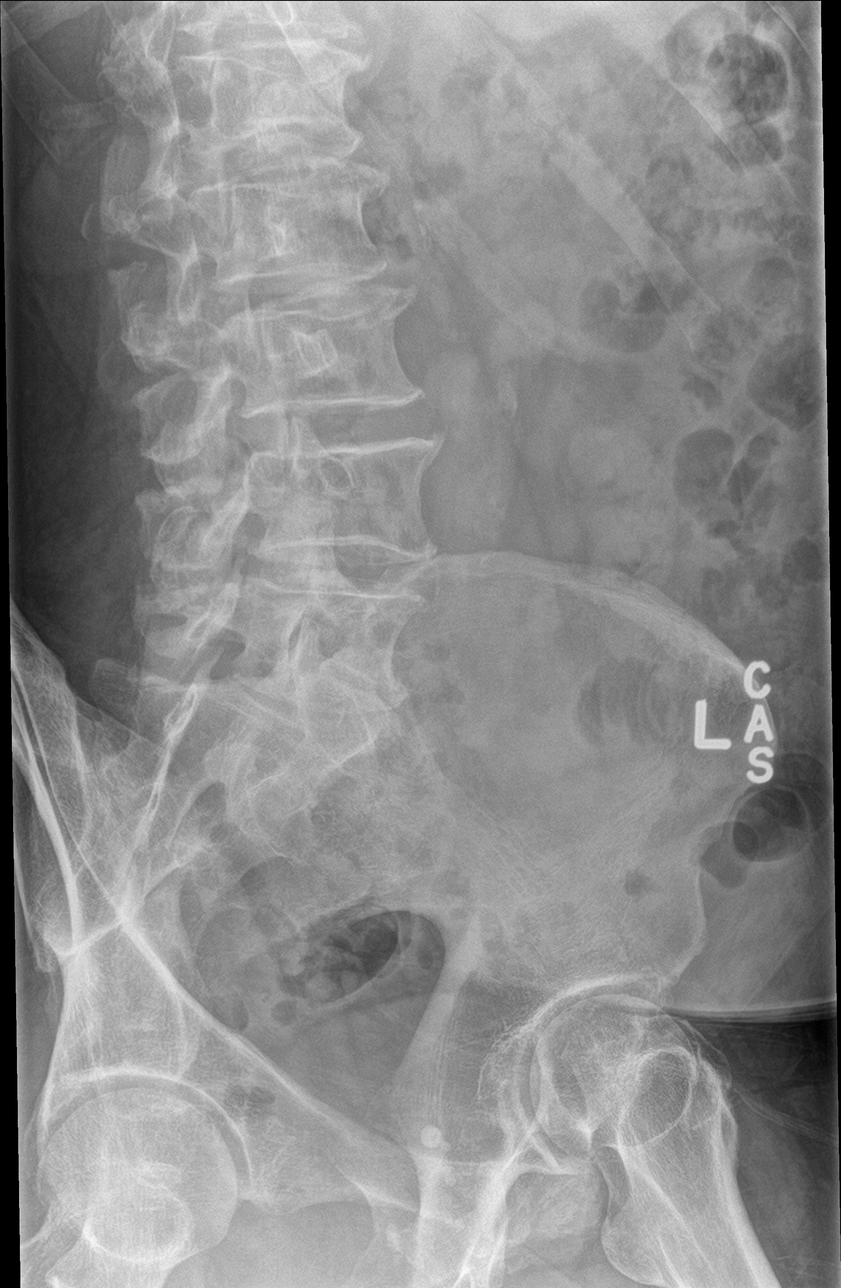

[l-spine lat]
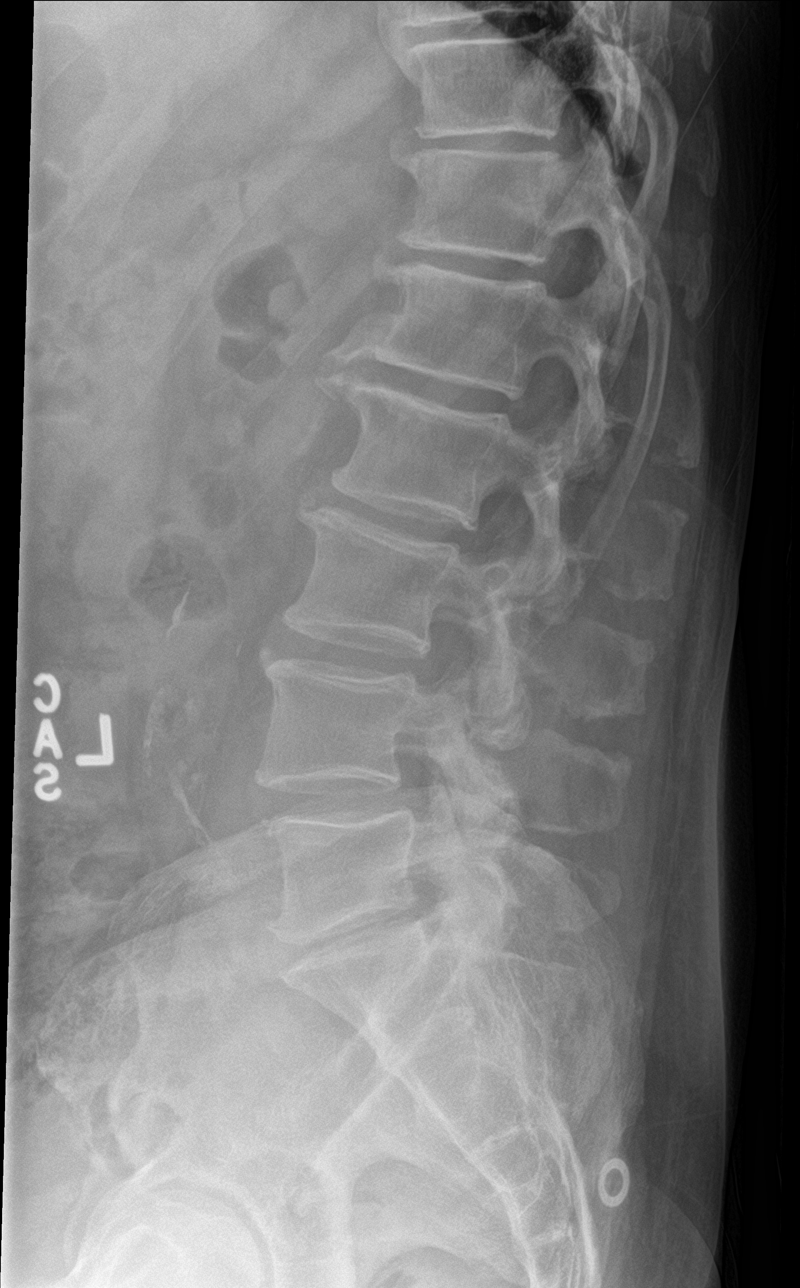

[l-spine spot]
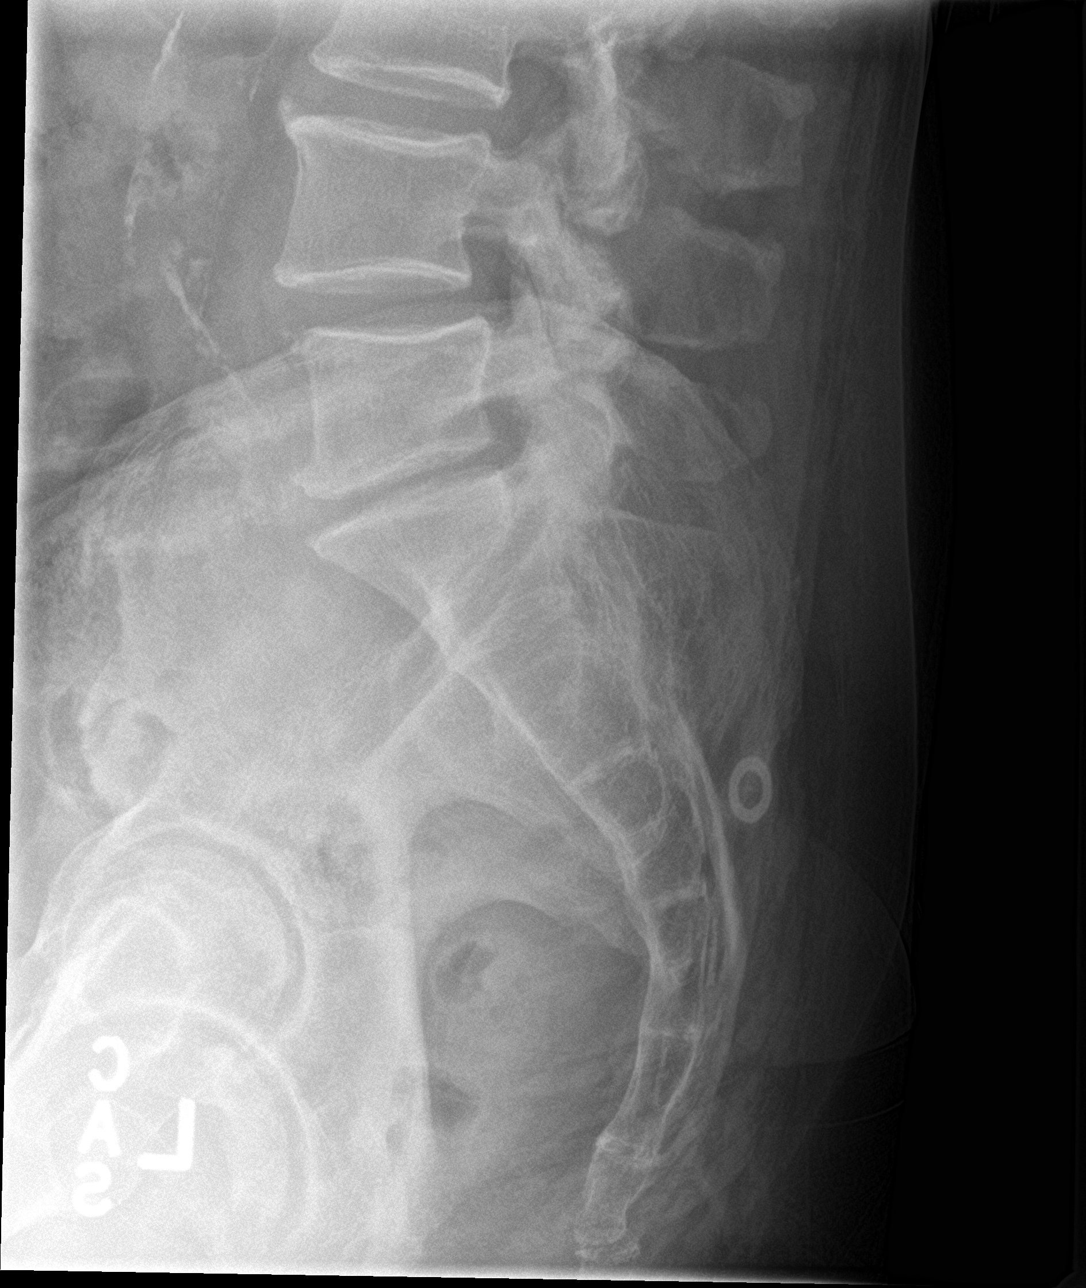

[5 of 5 positions shown; findings below may reference images not displayed]

FINDINGS: No fracture or traumatic malalignment. Multilevel degenerative disc
disease most prominent in the lower thoracic and upper lumbar spine.
Lower lumbar facet degenerative changes.
IMPRESSION: No fracture.  Degenerative changes.

## 2020-05-03 ENCOUNTER — Other Ambulatory Visit: Payer: Self-pay | Admitting: Internal Medicine

## 2020-05-03 DIAGNOSIS — M542 Cervicalgia: Secondary | ICD-10-CM

## 2020-05-11 ENCOUNTER — Ambulatory Visit
Admission: RE | Admit: 2020-05-11 | Discharge: 2020-05-11 | Disposition: A | Discharge status: Home / Self Care | Payer: Commercial Managed Care - PPO | Source: Ambulatory Visit | Attending: Internal Medicine | Admitting: Internal Medicine

## 2020-05-11 ENCOUNTER — Other Ambulatory Visit: Payer: Self-pay

## 2020-05-11 DIAGNOSIS — M542 Cervicalgia: Secondary | ICD-10-CM

## 2020-05-11 MED ORDER — TRIAMCINOLONE ACETONIDE 40 MG/ML IJ SUSP (RADIOLOGY)
60.0000 mg | Freq: Once | INTRAMUSCULAR | Status: AC
  Administered 2020-05-11: 60 mg via EPIDURAL

## 2020-05-11 MED ORDER — IOPAMIDOL (ISOVUE-M 300) INJECTION 61%
1.0000 mL | Freq: Once | INTRAMUSCULAR | Status: AC
  Administered 2020-05-11: 1 mL via EPIDURAL

## 2020-05-17 ENCOUNTER — Other Ambulatory Visit: Payer: Self-pay | Admitting: Rheumatology

## 2020-05-17 DIAGNOSIS — M542 Cervicalgia: Secondary | ICD-10-CM

## 2020-05-31 ENCOUNTER — Other Ambulatory Visit: Payer: Self-pay

## 2020-05-31 ENCOUNTER — Ambulatory Visit
Admission: RE | Admit: 2020-05-31 | Discharge: 2020-05-31 | Disposition: A | Discharge status: Home / Self Care | Payer: Commercial Managed Care - PPO | Source: Ambulatory Visit | Attending: Rheumatology | Admitting: Rheumatology

## 2020-05-31 ENCOUNTER — Other Ambulatory Visit: Payer: Commercial Managed Care - PPO

## 2020-05-31 DIAGNOSIS — M542 Cervicalgia: Secondary | ICD-10-CM

## 2020-05-31 MED ORDER — IOPAMIDOL (ISOVUE-M 300) INJECTION 61%
1.0000 mL | Freq: Once | INTRAMUSCULAR | Status: AC | PRN
  Administered 2020-05-31: 1 mL via EPIDURAL

## 2020-05-31 MED ORDER — TRIAMCINOLONE ACETONIDE 40 MG/ML IJ SUSP (RADIOLOGY)
60.0000 mg | Freq: Once | INTRAMUSCULAR | Status: AC
  Administered 2020-05-31: 60 mg via EPIDURAL

## 2020-05-31 NOTE — Discharge Instructions (Signed)

## 2020-06-04 ENCOUNTER — Other Ambulatory Visit: Payer: Commercial Managed Care - PPO

## 2020-08-27 ENCOUNTER — Other Ambulatory Visit: Payer: Self-pay

## 2020-08-27 ENCOUNTER — Other Ambulatory Visit: Payer: Commercial Managed Care - PPO

## 2020-08-27 DIAGNOSIS — Z20822 Contact with and (suspected) exposure to covid-19: Secondary | ICD-10-CM

## 2020-08-30 LAB — NOVEL CORONAVIRUS, NAA: SARS-CoV-2, NAA: NOT DETECTED

## 2020-08-30 LAB — SARS-COV-2, NAA 2 DAY TAT

## 2020-08-30 LAB — SPECIMEN STATUS REPORT

## 2022-04-01 ENCOUNTER — Encounter (INDEPENDENT_AMBULATORY_CARE_PROVIDER_SITE_OTHER): Payer: Self-pay | Admitting: *Deleted

## 2022-09-09 ENCOUNTER — Encounter (INDEPENDENT_AMBULATORY_CARE_PROVIDER_SITE_OTHER): Payer: Self-pay | Admitting: *Deleted

## 2023-03-31 ENCOUNTER — Ambulatory Visit: Payer: Commercial Managed Care - PPO | Admitting: Internal Medicine

## 2023-05-22 DIAGNOSIS — M79671 Pain in right foot: Secondary | ICD-10-CM | POA: Diagnosis not present

## 2023-05-22 DIAGNOSIS — R739 Hyperglycemia, unspecified: Secondary | ICD-10-CM | POA: Diagnosis not present

## 2023-05-22 DIAGNOSIS — R5383 Other fatigue: Secondary | ICD-10-CM | POA: Diagnosis not present

## 2023-05-22 DIAGNOSIS — E785 Hyperlipidemia, unspecified: Secondary | ICD-10-CM | POA: Diagnosis not present

## 2023-05-22 DIAGNOSIS — M722 Plantar fascial fibromatosis: Secondary | ICD-10-CM | POA: Diagnosis not present

## 2023-05-22 DIAGNOSIS — M25572 Pain in left ankle and joints of left foot: Secondary | ICD-10-CM | POA: Diagnosis not present

## 2023-05-22 DIAGNOSIS — I10 Essential (primary) hypertension: Secondary | ICD-10-CM | POA: Diagnosis not present

## 2023-05-22 DIAGNOSIS — M25571 Pain in right ankle and joints of right foot: Secondary | ICD-10-CM | POA: Diagnosis not present

## 2023-05-22 DIAGNOSIS — R7303 Prediabetes: Secondary | ICD-10-CM | POA: Diagnosis not present

## 2023-05-22 DIAGNOSIS — M79672 Pain in left foot: Secondary | ICD-10-CM | POA: Diagnosis not present

## 2023-05-29 DIAGNOSIS — E782 Mixed hyperlipidemia: Secondary | ICD-10-CM | POA: Diagnosis not present

## 2023-05-29 DIAGNOSIS — Z0000 Encounter for general adult medical examination without abnormal findings: Secondary | ICD-10-CM | POA: Diagnosis not present

## 2023-05-29 DIAGNOSIS — K760 Fatty (change of) liver, not elsewhere classified: Secondary | ICD-10-CM | POA: Diagnosis not present

## 2023-05-29 DIAGNOSIS — R7303 Prediabetes: Secondary | ICD-10-CM | POA: Diagnosis not present

## 2023-05-29 DIAGNOSIS — I10 Essential (primary) hypertension: Secondary | ICD-10-CM | POA: Diagnosis not present

## 2023-05-29 DIAGNOSIS — M109 Gout, unspecified: Secondary | ICD-10-CM | POA: Diagnosis not present

## 2023-05-29 DIAGNOSIS — N1831 Chronic kidney disease, stage 3a: Secondary | ICD-10-CM | POA: Diagnosis not present

## 2023-06-05 DIAGNOSIS — M65871 Other synovitis and tenosynovitis, right ankle and foot: Secondary | ICD-10-CM | POA: Diagnosis not present

## 2023-06-05 DIAGNOSIS — M722 Plantar fascial fibromatosis: Secondary | ICD-10-CM | POA: Diagnosis not present

## 2023-06-05 DIAGNOSIS — M25571 Pain in right ankle and joints of right foot: Secondary | ICD-10-CM | POA: Diagnosis not present

## 2023-06-26 DIAGNOSIS — M25572 Pain in left ankle and joints of left foot: Secondary | ICD-10-CM | POA: Diagnosis not present

## 2023-06-26 DIAGNOSIS — M65872 Other synovitis and tenosynovitis, left ankle and foot: Secondary | ICD-10-CM | POA: Diagnosis not present

## 2023-07-10 DIAGNOSIS — L600 Ingrowing nail: Secondary | ICD-10-CM | POA: Diagnosis not present

## 2023-07-15 DIAGNOSIS — H2511 Age-related nuclear cataract, right eye: Secondary | ICD-10-CM | POA: Diagnosis not present

## 2023-07-22 DIAGNOSIS — H2511 Age-related nuclear cataract, right eye: Secondary | ICD-10-CM | POA: Diagnosis not present

## 2023-07-24 DIAGNOSIS — M1712 Unilateral primary osteoarthritis, left knee: Secondary | ICD-10-CM | POA: Diagnosis not present

## 2023-07-24 DIAGNOSIS — M25562 Pain in left knee: Secondary | ICD-10-CM | POA: Diagnosis not present

## 2023-07-24 DIAGNOSIS — G8929 Other chronic pain: Secondary | ICD-10-CM | POA: Diagnosis not present

## 2023-08-01 DIAGNOSIS — H04123 Dry eye syndrome of bilateral lacrimal glands: Secondary | ICD-10-CM | POA: Diagnosis not present

## 2023-08-31 DIAGNOSIS — M25571 Pain in right ankle and joints of right foot: Secondary | ICD-10-CM | POA: Diagnosis not present

## 2023-08-31 DIAGNOSIS — M65871 Other synovitis and tenosynovitis, right ankle and foot: Secondary | ICD-10-CM | POA: Diagnosis not present

## 2023-09-18 DIAGNOSIS — M25571 Pain in right ankle and joints of right foot: Secondary | ICD-10-CM | POA: Diagnosis not present

## 2023-09-18 DIAGNOSIS — M19071 Primary osteoarthritis, right ankle and foot: Secondary | ICD-10-CM | POA: Diagnosis not present

## 2023-09-18 DIAGNOSIS — M65871 Other synovitis and tenosynovitis, right ankle and foot: Secondary | ICD-10-CM | POA: Diagnosis not present

## 2023-10-23 DIAGNOSIS — M7671 Peroneal tendinitis, right leg: Secondary | ICD-10-CM | POA: Diagnosis not present

## 2023-10-23 DIAGNOSIS — M25571 Pain in right ankle and joints of right foot: Secondary | ICD-10-CM | POA: Diagnosis not present

## 2023-10-30 DIAGNOSIS — M1712 Unilateral primary osteoarthritis, left knee: Secondary | ICD-10-CM | POA: Diagnosis not present

## 2023-10-30 DIAGNOSIS — G8929 Other chronic pain: Secondary | ICD-10-CM | POA: Diagnosis not present

## 2023-10-30 DIAGNOSIS — M25562 Pain in left knee: Secondary | ICD-10-CM | POA: Diagnosis not present

## 2023-11-20 DIAGNOSIS — K219 Gastro-esophageal reflux disease without esophagitis: Secondary | ICD-10-CM | POA: Diagnosis not present

## 2023-11-20 DIAGNOSIS — E782 Mixed hyperlipidemia: Secondary | ICD-10-CM | POA: Diagnosis not present

## 2023-11-20 DIAGNOSIS — G4700 Insomnia, unspecified: Secondary | ICD-10-CM | POA: Diagnosis not present

## 2023-11-20 DIAGNOSIS — R0602 Shortness of breath: Secondary | ICD-10-CM | POA: Diagnosis not present

## 2023-11-20 DIAGNOSIS — Z79899 Other long term (current) drug therapy: Secondary | ICD-10-CM | POA: Diagnosis not present

## 2023-11-20 DIAGNOSIS — K760 Fatty (change of) liver, not elsewhere classified: Secondary | ICD-10-CM | POA: Diagnosis not present

## 2023-11-20 DIAGNOSIS — I10 Essential (primary) hypertension: Secondary | ICD-10-CM | POA: Diagnosis not present

## 2023-11-20 DIAGNOSIS — R7303 Prediabetes: Secondary | ICD-10-CM | POA: Diagnosis not present

## 2023-11-20 DIAGNOSIS — N1831 Chronic kidney disease, stage 3a: Secondary | ICD-10-CM | POA: Diagnosis not present

## 2023-11-20 DIAGNOSIS — R072 Precordial pain: Secondary | ICD-10-CM | POA: Diagnosis not present

## 2023-11-30 DIAGNOSIS — M79672 Pain in left foot: Secondary | ICD-10-CM | POA: Diagnosis not present

## 2023-11-30 DIAGNOSIS — Q66221 Congenital metatarsus adductus, right foot: Secondary | ICD-10-CM | POA: Diagnosis not present

## 2023-11-30 DIAGNOSIS — Q66222 Congenital metatarsus adductus, left foot: Secondary | ICD-10-CM | POA: Diagnosis not present

## 2023-11-30 DIAGNOSIS — M79671 Pain in right foot: Secondary | ICD-10-CM | POA: Diagnosis not present

## 2023-11-30 DIAGNOSIS — M7671 Peroneal tendinitis, right leg: Secondary | ICD-10-CM | POA: Diagnosis not present

## 2023-11-30 DIAGNOSIS — M19072 Primary osteoarthritis, left ankle and foot: Secondary | ICD-10-CM | POA: Diagnosis not present

## 2023-11-30 DIAGNOSIS — M19071 Primary osteoarthritis, right ankle and foot: Secondary | ICD-10-CM | POA: Diagnosis not present

## 2023-12-18 ENCOUNTER — Ambulatory Visit: Payer: Self-pay | Admitting: Internal Medicine

## 2023-12-21 DIAGNOSIS — M25571 Pain in right ankle and joints of right foot: Secondary | ICD-10-CM | POA: Diagnosis not present

## 2023-12-21 DIAGNOSIS — M722 Plantar fascial fibromatosis: Secondary | ICD-10-CM | POA: Diagnosis not present

## 2023-12-21 DIAGNOSIS — M7671 Peroneal tendinitis, right leg: Secondary | ICD-10-CM | POA: Diagnosis not present

## 2024-01-26 ENCOUNTER — Other Ambulatory Visit: Payer: Self-pay | Admitting: Surgery

## 2024-01-29 ENCOUNTER — Encounter: Payer: Self-pay | Admitting: Internal Medicine

## 2024-01-29 ENCOUNTER — Ambulatory Visit (INDEPENDENT_AMBULATORY_CARE_PROVIDER_SITE_OTHER): Payer: Medicare Other | Admitting: Internal Medicine

## 2024-01-29 VITALS — BP 132/76 | HR 66 | Ht 70.0 in | Wt 211.4 lb

## 2024-01-29 DIAGNOSIS — I671 Cerebral aneurysm, nonruptured: Secondary | ICD-10-CM | POA: Diagnosis not present

## 2024-01-29 DIAGNOSIS — E669 Obesity, unspecified: Secondary | ICD-10-CM

## 2024-01-29 DIAGNOSIS — F5104 Psychophysiologic insomnia: Secondary | ICD-10-CM

## 2024-01-29 DIAGNOSIS — Z800 Family history of malignant neoplasm of digestive organs: Secondary | ICD-10-CM | POA: Insufficient documentation

## 2024-01-29 DIAGNOSIS — M1712 Unilateral primary osteoarthritis, left knee: Secondary | ICD-10-CM | POA: Diagnosis not present

## 2024-01-29 DIAGNOSIS — Z1211 Encounter for screening for malignant neoplasm of colon: Secondary | ICD-10-CM

## 2024-01-29 DIAGNOSIS — Z1159 Encounter for screening for other viral diseases: Secondary | ICD-10-CM

## 2024-01-29 DIAGNOSIS — E782 Mixed hyperlipidemia: Secondary | ICD-10-CM | POA: Diagnosis not present

## 2024-01-29 DIAGNOSIS — Z79899 Other long term (current) drug therapy: Secondary | ICD-10-CM | POA: Diagnosis not present

## 2024-01-29 DIAGNOSIS — I10 Essential (primary) hypertension: Secondary | ICD-10-CM | POA: Diagnosis not present

## 2024-01-29 DIAGNOSIS — K219 Gastro-esophageal reflux disease without esophagitis: Secondary | ICD-10-CM | POA: Diagnosis not present

## 2024-01-29 HISTORY — DX: Psychophysiologic insomnia: F51.04

## 2024-01-29 MED ORDER — SPIRONOLACTONE 25 MG PO TABS: 12.5000 mg | ORAL_TABLET | Freq: Every day | ORAL | 3 refills | Status: DC

## 2024-01-29 MED ORDER — PRAVASTATIN SODIUM 10 MG PO TABS: 10.0000 mg | ORAL_TABLET | Freq: Every day | ORAL | 3 refills | Status: DC

## 2024-01-29 MED ORDER — DILTIAZEM HCL ER COATED BEADS 360 MG PO CP24: 360.0000 mg | ORAL_CAPSULE | Freq: Every day | ORAL | 3 refills | Status: DC

## 2024-01-29 NOTE — Progress Notes (Signed)
New Patient Office Visit  Subjective    Patient ID: Ralph Marquez, male    DOB: 01-24-1956  Age: 68 y.o. MRN: [id number]  CC:  Chief Complaint  Patient presents with   Establish Care    HPI Ralph Marquez presents to establish care.  He is a 68 year old male with a past medical history significant for HTN, HLD, GERD, insomnia, and ACA aneurysm s/p coil embolization (2019).  Previously followed by Dr. Nicholos Marquez at [facility].  [name] reports feeling well today.  He is asymptomatic and has no acute concerns to discuss aside from desiring to establish care.  Currently works as an Product manager.  Denies tobacco, alcohol, and illicit drug use.  Family medical history significant for colon cancer in multiple siblings and diabetes mellitus.  Chronic medical conditions and outstanding preventative care items discussed today are individually addressed in A/P below.   Outpatient Encounter Medications as of 01/29/2024  Medication Sig   aspirin 81 MG chewable tablet Chew 1 tablet (81 mg total) by mouth daily.   pantoprazole (PROTONIX) 40 MG tablet Take 1 tablet (40 mg total) by mouth 2 (two) times daily before a meal.   triazolam (HALCION) 0.25 MG tablet Take 0.25 mg by mouth at bedtime as needed for sleep.   [DISCONTINUED] diltiazem (CARDIZEM CD) 360 MG 24 hr capsule Take 360 mg by mouth daily.   [DISCONTINUED] pravastatin (PRAVACHOL) 10 MG tablet Take 1 tablet by mouth daily.   diltiazem (CARDIZEM CD) 360 MG 24 hr capsule Take 1 capsule (360 mg total) by mouth daily.   pravastatin (PRAVACHOL) 10 MG tablet Take 1 tablet (10 mg total) by mouth daily.   spironolactone (ALDACTONE) 25 MG tablet Take 0.5 tablets (12.5 mg total) by mouth daily.   [DISCONTINUED] spironolactone (ALDACTONE) 25 MG tablet Take 0.5 tablets (12.5 mg total) by mouth daily.   No facility-administered encounter medications on file as of 01/29/2024.    Past Medical History:  Diagnosis Date    Hypertension     Past Surgical History:  Procedure Laterality Date   bone spur shoulder     ESOPHAGOGASTRODUODENOSCOPY N/A 07/30/2017   Procedure: ESOPHAGOGASTRODUODENOSCOPY (EGD) Possible esophageal dilation.;  Surgeon: Malissa Hippo, MD;  Location: [facility];  Service: Endoscopy;  Laterality: N/A;   REPLACEMENT TOTAL KNEE      Family History  Problem Relation Age of Onset   CAD Mother        CABG in 5's   CVA Sister     Social History   Socioeconomic History   Marital status: Married    Spouse name: Not on file   Number of children: Not on file   Years of education: Not on file   Highest education level: Not on file  Occupational History   Not on file  Tobacco Use   Smoking status: Former    Types: Cigarettes   Smokeless tobacco: Never   Tobacco comments:    40 yrs quit 3 yrs ago  Vaping Use   Vaping status: Never Used  Substance and Sexual Activity   Alcohol use: No   Drug use: No   Sexual activity: Not on file  Other Topics Concern   Not on file  Social History Narrative   Not on file   Social Drivers of Health   Financial Resource Strain: Medium Risk (11/30/2023)   Received [facility]   Overall Financial Resource Strain (CARDIA)    Difficulty of Paying Living  Expenses: Somewhat hard  Food Insecurity: Food Insecurity Present (11/30/2023)   Received from [facility]   Hunger Vital Sign    Worried About Running Out of Food in the Last Year: Sometimes true    Ran Out of Food in the Last Year: Sometimes true  Transportation Needs: No Transportation Needs (11/30/2023)   Received from [facility]    In the past 12 months, has lack of transportation kept you from medical appointments or from getting medications?: No    Lack of Transportation (Non-Medical): No  Physical Activity: Not on file  Stress: Not on file  Social Connections: Unknown (04/22/2022)   Received  from [facility]   Social Network    Social Network: Not on file  Intimate Partner Violence: Unknown (03/14/2022)   Received from [facility]   HITS    Physically Hurt: Not on file    Insult or Talk Down To: Not on file    Threaten Physical Harm: Not on file    Scream or Curse: Not on file   Review of Systems  Constitutional:  Negative for chills and fever.  HENT:  Negative for sore throat.   Respiratory:  Negative for cough and shortness of breath.   Cardiovascular:  Negative for chest pain, palpitations and leg swelling.  Gastrointestinal:  Negative for abdominal pain, blood in stool, constipation, diarrhea, nausea and vomiting.  Genitourinary:  Negative for dysuria and hematuria.  Musculoskeletal:  Negative for myalgias.  Skin:  Negative for itching and rash.  Neurological:  Negative for dizziness and headaches.  Psychiatric/Behavioral:  Negative for depression and suicidal ideas.    Objective    BP 132/76   Pulse 66   Ht 5\' 10"  (1.778 m)   Wt 211 lb 6.4 oz (95.9 kg)   SpO2 93%   BMI 30.33 kg/m   Physical Exam Vitals reviewed.  Constitutional:      General: He is not in acute distress.    Appearance: Normal appearance. He is not ill-appearing.  HENT:     Head: Normocephalic and atraumatic.     Right Ear: External ear normal.     Left Ear: External ear normal.     Nose: Nose normal. No congestion or rhinorrhea.     Mouth/Throat:     Mouth: Mucous membranes are moist.     Pharynx: Oropharynx is clear.  Eyes:     General: No scleral icterus.    Extraocular Movements: Extraocular movements intact.     Conjunctiva/sclera: Conjunctivae normal.     Pupils: Pupils are equal, round, and reactive to light.  Cardiovascular:     Rate and Rhythm: Normal rate and regular rhythm.     Pulses: Normal pulses.     Heart sounds: Normal heart sounds. No murmur heard. Pulmonary:     Effort: Pulmonary effort is normal.     Breath sounds: Normal  breath sounds. No wheezing, rhonchi or rales.  Abdominal:     General: Abdomen is flat. Bowel sounds are normal. There is no distension.     Palpations: Abdomen is soft.     Tenderness: There is no abdominal tenderness.  Musculoskeletal:        General: No swelling or deformity. Normal range of motion.     Cervical back: Normal range of motion.  Skin:    General: Skin is warm and dry.     Capillary Refill: Capillary refill takes less than  2 seconds.  Neurological:     General: No focal deficit present.     Mental Status: He is alert and oriented to person, place, and time.     Motor: No weakness.  Psychiatric:        Mood and Affect: Mood normal.        Behavior: Behavior normal.        Thought Content: Thought content normal.    Assessment & Plan:   Problem List Items Addressed This Visit       Essential hypertension - Primary   He is currently prescribed diltiazem 360 mg daily and spironolactone 12.5 mg daily.  Documented allergies to lisinopril, losartan, and amlodipine.  BP is well-controlled on this regimen.  No medication changes are indicated today.  Refills provided.      Anterior communicating artery aneurysm   History of ACA aneurysm s/p coil embolization in September 2019.  Last evaluated by neurosurgery at [facility] in 2023.  Repeat MRA brain recommended for 5 years (2028).      GERD (gastroesophageal reflux disease)   He is prescribed Protonix 40 mg twice daily, currently taking once daily and does not think that he needs to continue taking it.  We discussed trialing off of Protonix to see if it remains needed.      Primary osteoarthritis of left knee   He is currently scheduled to undergo left knee TKA on 3/4 with Dr. Joice Lofts.      Mixed hyperlipidemia   Currently prescribed pravastatin 10 mg daily.  Repeat lipid panel ordered today.      Psychophysiologic insomnia   Currently prescribed triazolam 0.25 mg nightly as needed.  PDMP reviewed and is  appropriate.  Prescription last filled earlier this week (2/17).  UDS and controlled substance agreement are pending.      Family history of colon cancer   He endorses a family medical history significant for colon cancer in multiple siblings.  Last underwent colonoscopy 8 years ago.  Records not available for review.  Will refer to gastroenterology for repeat colonoscopy.      Return in about 6 months (around 07/28/2024).   Billie Lade, MD

## 2024-01-29 NOTE — Assessment & Plan Note (Signed)
Currently prescribed pravastatin 10 mg daily.  Repeat lipid panel ordered today.

## 2024-01-29 NOTE — Assessment & Plan Note (Signed)
He is prescribed Protonix 40 mg twice daily, currently taking once daily and does not think that he needs to continue taking it.  We discussed trialing off of Protonix to see if it remains needed.

## 2024-01-29 NOTE — Assessment & Plan Note (Signed)
He is currently scheduled to undergo left knee TKA on 3/4 with Dr. Joice Lofts.

## 2024-01-29 NOTE — Assessment & Plan Note (Signed)
Currently prescribed triazolam 0.25 mg nightly as needed.  PDMP reviewed and is appropriate.  Prescription last filled earlier this week (2/17).  UDS and controlled substance agreement are pending.

## 2024-01-29 NOTE — Patient Instructions (Signed)
It was a pleasure to see you today.  Thank you for giving [place] the opportunity to be involved in your care.  Below is a brief recap of your visit and next steps.  We will plan to see you again in 6 months.  Summary You have established care today No medication changes were made Will order basic labs and urine drug test Refills provided We will tentatively plan for follow up in 6 months

## 2024-01-29 NOTE — Assessment & Plan Note (Signed)
History of ACA aneurysm s/p coil embolization in September 2019.  Last evaluated by neurosurgery at [facility] in 2023.  Repeat MRA brain recommended for 5 years (2028).

## 2024-01-29 NOTE — Assessment & Plan Note (Signed)
He endorses a family medical history significant for colon cancer in multiple siblings.  Last underwent colonoscopy 8 years ago.  Records not available for review.  Will refer to gastroenterology for repeat colonoscopy.

## 2024-01-29 NOTE — Assessment & Plan Note (Signed)
He is currently prescribed diltiazem 360 mg daily and spironolactone 12.5 mg daily.  Documented allergies to lisinopril, losartan, and amlodipine.  BP is well-controlled on this regimen.  No medication changes are indicated today.  Refills provided.

## 2024-02-01 DIAGNOSIS — M1712 Unilateral primary osteoarthritis, left knee: Secondary | ICD-10-CM | POA: Diagnosis not present

## 2024-02-01 DIAGNOSIS — G8929 Other chronic pain: Secondary | ICD-10-CM | POA: Diagnosis not present

## 2024-02-01 DIAGNOSIS — M25562 Pain in left knee: Secondary | ICD-10-CM | POA: Diagnosis not present

## 2024-02-02 ENCOUNTER — Encounter (INDEPENDENT_AMBULATORY_CARE_PROVIDER_SITE_OTHER): Payer: Self-pay | Admitting: *Deleted

## 2024-02-02 ENCOUNTER — Inpatient Hospital Stay: Admission: RE | Admit: 2024-02-02 | Payer: Commercial Managed Care - PPO | Source: Ambulatory Visit

## 2024-02-05 ENCOUNTER — Other Ambulatory Visit: Payer: Self-pay

## 2024-02-05 ENCOUNTER — Telehealth: Payer: Self-pay | Admitting: Internal Medicine

## 2024-02-05 ENCOUNTER — Encounter
Admission: RE | Admit: 2024-02-05 | Discharge: 2024-02-05 | Disposition: A | Discharge status: Home / Self Care | Payer: Commercial Managed Care - PPO | Source: Ambulatory Visit | Attending: Surgery | Admitting: Surgery

## 2024-02-05 VITALS — BP 147/93 | HR 77 | Temp 98.3°F | Resp 18 | Ht 70.0 in | Wt 206.4 lb

## 2024-02-05 DIAGNOSIS — K219 Gastro-esophageal reflux disease without esophagitis: Secondary | ICD-10-CM | POA: Diagnosis not present

## 2024-02-05 DIAGNOSIS — E782 Mixed hyperlipidemia: Secondary | ICD-10-CM | POA: Diagnosis not present

## 2024-02-05 DIAGNOSIS — Z01818 Encounter for other preprocedural examination: Secondary | ICD-10-CM | POA: Insufficient documentation

## 2024-02-05 DIAGNOSIS — Z01812 Encounter for preprocedural laboratory examination: Secondary | ICD-10-CM

## 2024-02-05 DIAGNOSIS — I10 Essential (primary) hypertension: Secondary | ICD-10-CM | POA: Insufficient documentation

## 2024-02-05 DIAGNOSIS — E559 Vitamin D deficiency, unspecified: Secondary | ICD-10-CM | POA: Diagnosis not present

## 2024-02-05 DIAGNOSIS — Z1159 Encounter for screening for other viral diseases: Secondary | ICD-10-CM | POA: Diagnosis not present

## 2024-02-05 DIAGNOSIS — R9431 Abnormal electrocardiogram [ECG] [EKG]: Secondary | ICD-10-CM | POA: Insufficient documentation

## 2024-02-05 DIAGNOSIS — Z79899 Other long term (current) drug therapy: Secondary | ICD-10-CM | POA: Diagnosis not present

## 2024-02-05 HISTORY — DX: Gastro-esophageal reflux disease without esophagitis: K21.9

## 2024-02-05 LAB — COMPREHENSIVE METABOLIC PANEL
ALT: 20 U/L (ref 0–44)
AST: 15 U/L (ref 15–41)
Albumin: 3.6 g/dL (ref 3.5–5.0)
Alkaline Phosphatase: 64 U/L (ref 38–126)
Anion gap: 8 (ref 5–15)
BUN: 38 mg/dL — ABNORMAL HIGH (ref 8–23)
CO2: 25 mmol/L (ref 22–32)
Calcium: 9 mg/dL (ref 8.9–10.3)
Chloride: 108 mmol/L (ref 98–111)
Creatinine, Ser: 1.5 mg/dL — ABNORMAL HIGH (ref 0.61–1.24)
GFR, Estimated: 51 mL/min — ABNORMAL LOW (ref >60)
Glucose, Bld: 96 mg/dL (ref 70–99)
Potassium: 4 mmol/L (ref 3.5–5.1)
Sodium: 141 mmol/L (ref 135–145)
Total Bilirubin: 0.8 mg/dL (ref 0.0–1.2)
Total Protein: 7.2 g/dL (ref 6.5–8.1)

## 2024-02-05 LAB — CBC WITH DIFFERENTIAL/PLATELET
Abs Immature Granulocytes: 0.06 10*3/uL (ref 0.00–0.07)
Basophils Absolute: 0.1 10*3/uL (ref 0.0–0.1)
Basophils Relative: 1 %
Eosinophils Absolute: 0.2 10*3/uL (ref 0.0–0.5)
Eosinophils Relative: 2 %
HCT: 41.9 % (ref 39.0–52.0)
Hemoglobin: 13.9 g/dL (ref 13.0–17.0)
Immature Granulocytes: 1 %
Lymphocytes Relative: 15 %
Lymphs Abs: 1.7 10*3/uL (ref 0.7–4.0)
MCH: 31.2 pg (ref 26.0–34.0)
MCHC: 33.2 g/dL (ref 30.0–36.0)
MCV: 94.2 fL (ref 80.0–100.0)
Monocytes Absolute: 0.7 10*3/uL (ref 0.1–1.0)
Monocytes Relative: 6 %
Neutro Abs: 8.5 10*3/uL — ABNORMAL HIGH (ref 1.7–7.7)
Neutrophils Relative %: 75 %
Platelets: 338 10*3/uL (ref 150–400)
RBC: 4.45 MIL/uL (ref 4.22–5.81)
RDW: 13.2 % (ref 11.5–15.5)
WBC: 11.1 10*3/uL — ABNORMAL HIGH (ref 4.0–10.5)
nRBC: 0 % (ref 0.0–0.2)

## 2024-02-05 LAB — URINALYSIS, ROUTINE W REFLEX MICROSCOPIC
Bacteria, UA: NONE SEEN
Bilirubin Urine: NEGATIVE
Glucose, UA: NEGATIVE mg/dL
Ketones, ur: NEGATIVE mg/dL
Leukocytes,Ua: NEGATIVE
Nitrite: NEGATIVE
Protein, ur: NEGATIVE mg/dL
Specific Gravity, Urine: 1.027 (ref 1.005–1.030)
pH: 5 (ref 5.0–8.0)

## 2024-02-05 LAB — SURGICAL PCR SCREEN
MRSA, PCR: NEGATIVE
Staphylococcus aureus: NEGATIVE

## 2024-02-05 MED ORDER — PANTOPRAZOLE SODIUM 40 MG PO TBEC: 40.0000 mg | DELAYED_RELEASE_TABLET | Freq: Two times a day (BID) | ORAL | 2 refills | Status: DC

## 2024-02-05 NOTE — Telephone Encounter (Signed)
 Refills sent to pharmacy.

## 2024-02-05 NOTE — Patient Instructions (Addendum)
 Your procedure is scheduled on: Tuesday March 4  Report to the Registration Desk on the 1st floor of the [facility] To find out your arrival time, please call [phone] between 1PM - 3PM on: Monday March 3  If your arrival time is 6:00 am, do not arrive before that time as the [facility] entrance doors do not open until 6:00 am.  REMEMBER: Instructions that are not followed completely may result in serious medical risk, up to and including death; or upon the discretion of your surgeon and anesthesiologist your surgery may need to be rescheduled.  Do not eat food after midnight the night before surgery.  No gum chewing or hard candies.  You may however, drink CLEAR liquids up to 2 hours before you are scheduled to arrive for your surgery. Do not drink anything within 2 hours of your scheduled arrival time.  Clear liquids include: - water  - [place] juice without pulp - gatorade (not RED colors) - black coffee or tea (Do NOT add milk or creamers to the coffee or tea) Do NOT drink anything that is not on this list.   In addition, your doctor has ordered for you to drink the provided:  Ensure Pre-Surgery Clear Carbohydrate Drink   Drinking this carbohydrate drink up to two hours before surgery helps to reduce insulin resistance and improve patient outcomes. Please complete drinking 2 hours before scheduled arrival time.  One week prior to surgery: Tuesday February 25  Stop Anti-inflammatories (NSAIDS) such as Advil, Aleve, Ibuprofen, Motrin, Naproxen, Naprosyn and Aspirin based products such as Excedrin, Goody's Powder, BC Powder. Stop ANY OVER THE COUNTER supplements until after surgery.  You may however, continue to take Tylenol if needed for pain up until the day of surgery.  **Follow recommendations regarding stopping blood thinners.** aspirin 81 Do not take the morning of surgery , las dose Monday March 3   Continue taking all of your other prescription medications up  until the day of surgery.  ON THE DAY OF SURGERY ONLY TAKE THESE MEDICATIONS WITH SIPS OF WATER:  diltiazem (CARDIZEM CD)  pantoprazole (PROTONIX)    No Alcohol for 24 hours before or after surgery.  No Smoking including e-cigarettes for 24 hours before surgery.  No chewable tobacco products for at least 6 hours before surgery.  No nicotine patches on the day of surgery.  Do not use any "recreational" drugs for at least a week (preferably 2 weeks) before your surgery.  Please be advised that the combination of cocaine and anesthesia may have negative outcomes, up to and including death. If you test positive for cocaine, your surgery will be cancelled.  On the morning of surgery brush your teeth with toothpaste and water, you may rinse your mouth with mouthwash if you wish. Do not swallow any toothpaste or mouthwash.  Use CHG Soap as directed on instruction sheet.  Do not wear jewelry, make-up, hairpins, clips or nail polish.  For welded (permanent) jewelry: bracelets, anklets, waist bands, etc.  Please have this removed prior to surgery.  If it is not removed, there is a chance that hospital personnel will need to cut it off on the day of surgery.  Do not wear lotions, powders, or perfumes.   Do not shave body hair from the neck down 48 hours before surgery.  Contact lenses, hearing aids and dentures may not be worn into surgery.  Do not bring valuables to the hospital. [facility] is not responsible for any missing/lost belongings  or valuables.   Notify your doctor if there is any change in your medical condition (cold, fever, infection).  Wear comfortable clothing (specific to your surgery type) to the hospital.  After surgery, you can help prevent lung complications by doing breathing exercises.  Take deep breaths and cough every 1-2 hours. Your doctor may order a device called an Incentive Spirometer to help you take deep breaths.  If you are being admitted to the  hospital overnight, leave your suitcase in the car. After surgery it may be brought to your room.  In case of increased patient census, it may be necessary for you, the patient, to continue your postoperative care in the Same Day Surgery department.  If you are being discharged the day of surgery, you will not be allowed to drive home. You will need a responsible individual to drive you home and stay with you for 24 hours after surgery.   If you are taking public transportation, you will need to have a responsible individual with you.  Please call the Pre-admissions Testing Dept. at [phone] if you have any questions about these instructions.  Surgery Visitation Policy:  Patients having surgery or a procedure may have two visitors.  Children under the age of 41 must have an adult with them who is not the patient.  Temporary Visitor Restrictions Due to increasing cases of flu, RSV and COVID-19: Children ages 36 and under will not be able to visit patients in [facility] hospitals under most circumstances.  Inpatient Visitation:    Visiting hours are 7 a.m. to 8 p.m. Up to four visitors are allowed at one time in a patient room. The visitors may rotate out with other people during the day.  One visitor age 27 or older may stay with the patient overnight and must be in the room by 8 p.m.       Pre-operative 5 CHG Bath Instructions   You can play a key role in reducing the risk of infection after surgery. Your skin needs to be as free of germs as possible. You can reduce the number of germs on your skin by washing with CHG (chlorhexidine gluconate) soap before surgery. CHG is an antiseptic soap that kills germs and continues to kill germs even after washing.   DO NOT use if you have an allergy to chlorhexidine/CHG or antibacterial soaps. If your skin becomes reddened or irritated, stop using the CHG and notify one of our RNs at [phone].   Please shower with the CHG soap  starting 4 days before surgery using the following schedule:     Please keep in mind the following:  DO NOT shave, including legs and underarms, starting the day of your first shower.   You may shave your face at any point before/day of surgery.  Place clean sheets on your bed the day you start using CHG soap. Use a clean washcloth (not used since being washed) for each shower. DO NOT sleep with pets once you start using the CHG.   CHG Shower Instructions:  If you choose to wash your hair and private area, wash first with your normal shampoo/soap.  After you use shampoo/soap, rinse your hair and body thoroughly to remove shampoo/soap residue.  Turn the water OFF and apply about 3 tablespoons (45 ml) of CHG soap to a CLEAN washcloth.  Apply CHG soap ONLY FROM YOUR NECK DOWN TO YOUR TOES (washing for 3-5 minutes)  DO NOT use CHG soap on face, private  areas, open wounds, or sores.  Pay special attention to the area where your surgery is being performed.  If you are having back surgery, having someone wash your back for you may be helpful. Wait 2 minutes after CHG soap is applied, then you may rinse off the CHG soap.  Pat dry with a clean towel  Put on clean clothes/pajamas   If you choose to wear lotion, please use ONLY the CHG-compatible lotions on the back of this paper.     Additional instructions for the day of surgery: DO NOT APPLY any lotions, deodorants, cologne, or perfumes.   Put on clean/comfortable clothes.  Brush your teeth.  Ask your nurse before applying any prescription medications to the skin.      CHG Compatible Lotions   Aveeno Moisturizing lotion  Cetaphil Moisturizing Cream  Cetaphil Moisturizing Lotion  Clairol Herbal Essence Moisturizing Lotion, Dry Skin  Clairol Herbal Essence Moisturizing Lotion, Extra Dry Skin  Clairol Herbal Essence Moisturizing Lotion, Normal Skin  Curel Age Defying Therapeutic Moisturizing Lotion with Alpha Hydroxy  Curel Extreme  Care Body Lotion  Curel Soothing Hands Moisturizing Hand Lotion  Curel Therapeutic Moisturizing Cream, Fragrance-Free  Curel Therapeutic Moisturizing Lotion, Fragrance-Free  Curel Therapeutic Moisturizing Lotion, Original Formula  Eucerin Daily Replenishing Lotion  Eucerin Dry Skin Therapy Plus Alpha Hydroxy Crme  Eucerin Dry Skin Therapy Plus Alpha Hydroxy Lotion  Eucerin Original Crme  Eucerin Original Lotion  Eucerin Plus Crme Eucerin Plus Lotion  Eucerin TriLipid Replenishing Lotion  Keri Anti-Bacterial Hand Lotion  Keri Deep Conditioning Original Lotion Dry Skin Formula Softly Scented  Keri Deep Conditioning Original Lotion, Fragrance Free Sensitive Skin Formula  Keri Lotion Fast Absorbing Fragrance Free Sensitive Skin Formula  Keri Lotion Fast Absorbing Softly Scented Dry Skin Formula  Keri Original Lotion  Keri Skin Renewal Lotion Keri Silky Smooth Lotion  Keri Silky Smooth Sensitive Skin Lotion  Nivea Body Creamy Conditioning Oil  Nivea Body Extra Enriched Lotion  Nivea Body Original Lotion  Nivea Body Sheer Moisturizing Lotion Nivea Crme  Nivea Skin Firming Lotion  NutraDerm 30 Skin Lotion  NutraDerm Skin Lotion  NutraDerm Therapeutic Skin Cream  NutraDerm Therapeutic Skin Lotion  ProShield Protective Hand Cream  Provon moisturizing lotion      How to Use an Incentive Spirometer  An incentive spirometer is a tool that measures how well you are filling your lungs with each breath. Learning to take long, deep breaths using this tool can help you keep your lungs clear and active. This may help to reverse or lessen your chance of developing breathing (pulmonary) problems, especially infection. You may be asked to use a spirometer: After a surgery. If you have a lung problem or a history of smoking. After a long period of time when you have been unable to move or be active. If the spirometer includes an indicator to show the highest number that you have reached, your  health care provider or respiratory therapist will help you set a goal. Keep a log of your progress as told by your health care provider. What are the risks? Breathing too quickly may cause dizziness or cause you to pass out. Take your time so you do not get dizzy or light-headed. If you are in pain, you may need to take pain medicine before doing incentive spirometry. It is harder to take a deep breath if you are having pain. How to use your incentive spirometer  Sit up on the edge of your bed or on a  chair. Hold the incentive spirometer so that it is in an upright position. Before you use the spirometer, breathe out normally. Place the mouthpiece in your mouth. Make sure your lips are closed tightly around it. Breathe in slowly and as deeply as you can through your mouth, causing the piston or the ball to rise toward the top of the chamber. Hold your breath for 3-5 seconds, or for as long as possible. If the spirometer includes a coach indicator, use this to guide you in breathing. Slow down your breathing if the indicator goes above the marked areas. Remove the mouthpiece from your mouth and breathe out normally. The piston or ball will return to the bottom of the chamber. Rest for a few seconds, then repeat the steps 10 or more times. Take your time and take a few normal breaths between deep breaths so that you do not get dizzy or light-headed. Do this every 1-2 hours when you are awake. If the spirometer includes a goal marker to show the highest number you have reached (best effort), use this as a goal to work toward during each repetition. After each set of 10 deep breaths, cough a few times. This will help to make sure that your lungs are clear. If you have an incision on your chest or abdomen from surgery, place a pillow or a rolled-up towel firmly against the incision when you cough. This can help to reduce pain while taking deep breaths and coughing. General tips When you are able to  get out of bed: Walk around often. Continue to take deep breaths and cough in order to clear your lungs. Keep using the incentive spirometer until your health care provider says it is okay to stop using it. If you have been in the hospital, you may be told to keep using the spirometer at home. Contact a health care provider if: You are having difficulty using the spirometer. You have trouble using the spirometer as often as instructed. Your pain medicine is not giving enough relief for you to use the spirometer as told. You have a fever. Get help right away if: You develop shortness of breath. You develop a cough with bloody mucus from the lungs. You have fluid or blood coming from an incision site after you cough. Summary An incentive spirometer is a tool that can help you learn to take long, deep breaths to keep your lungs clear and active. You may be asked to use a spirometer after a surgery, if you have a lung problem or a history of smoking, or if you have been inactive for a long period of time. Use your incentive spirometer as instructed every 1-2 hours while you are awake. If you have an incision on your chest or abdomen, place a pillow or a rolled-up towel firmly against your incision when you cough. This will help to reduce pain. Get help right away if you have shortness of breath, you cough up bloody mucus, or blood comes from your incision when you cough. This information is not intended to replace advice given to you by your health care provider. Make sure you discuss any questions you have with your health care provider. Document Revised: 02/13/2020 Document Reviewed: 02/13/2020 Elsevier Patient Education  2023 [employer] Inc[employer]                 Preoperative Educational Videos for Total Hip, Knee and Shoulder Replacements  To better prepare for surgery, please view our videos that explain the physical  activity and discharge planning required to have the best surgical  recovery at [facility].  [email or web address]  Questions? Call [phone] or email [email or web address]

## 2024-02-05 NOTE — Telephone Encounter (Signed)
 Patient came by office need med refills no longer get from his old pcp. Can not do without for acid reflux.  Pantoprazole 40 mg   Pharmacy: [facility]

## 2024-02-08 ENCOUNTER — Encounter: Payer: Self-pay | Admitting: Internal Medicine

## 2024-02-08 ENCOUNTER — Other Ambulatory Visit: Payer: Self-pay | Admitting: Internal Medicine

## 2024-02-08 DIAGNOSIS — R944 Abnormal results of kidney function studies: Secondary | ICD-10-CM

## 2024-02-09 ENCOUNTER — Encounter: Payer: Self-pay | Admitting: Surgery

## 2024-02-09 ENCOUNTER — Other Ambulatory Visit: Payer: Self-pay

## 2024-02-09 ENCOUNTER — Encounter
Admission: RE | Disposition: A | Discharge status: Home / Self Care | Payer: Self-pay | Source: Home / Self Care | Attending: Surgery

## 2024-02-09 ENCOUNTER — Ambulatory Visit
Admission: RE | Admit: 2024-02-09 | Discharge: 2024-02-10 | Disposition: A | Discharge status: Home / Self Care | Payer: Commercial Managed Care - PPO | Attending: Surgery | Admitting: Surgery

## 2024-02-09 ENCOUNTER — Ambulatory Visit

## 2024-02-09 DIAGNOSIS — Z791 Long term (current) use of non-steroidal anti-inflammatories (NSAID): Secondary | ICD-10-CM | POA: Diagnosis not present

## 2024-02-09 DIAGNOSIS — Z7982 Long term (current) use of aspirin: Secondary | ICD-10-CM | POA: Diagnosis not present

## 2024-02-09 DIAGNOSIS — Z87891 Personal history of nicotine dependence: Secondary | ICD-10-CM | POA: Diagnosis not present

## 2024-02-09 DIAGNOSIS — I10 Essential (primary) hypertension: Secondary | ICD-10-CM | POA: Diagnosis not present

## 2024-02-09 DIAGNOSIS — Z8673 Personal history of transient ischemic attack (TIA), and cerebral infarction without residual deficits: Secondary | ICD-10-CM | POA: Insufficient documentation

## 2024-02-09 DIAGNOSIS — Z7901 Long term (current) use of anticoagulants: Secondary | ICD-10-CM | POA: Insufficient documentation

## 2024-02-09 DIAGNOSIS — G8929 Other chronic pain: Secondary | ICD-10-CM | POA: Diagnosis not present

## 2024-02-09 DIAGNOSIS — K219 Gastro-esophageal reflux disease without esophagitis: Secondary | ICD-10-CM | POA: Diagnosis not present

## 2024-02-09 DIAGNOSIS — M1712 Unilateral primary osteoarthritis, left knee: Secondary | ICD-10-CM | POA: Insufficient documentation

## 2024-02-09 DIAGNOSIS — E782 Mixed hyperlipidemia: Secondary | ICD-10-CM | POA: Diagnosis not present

## 2024-02-09 DIAGNOSIS — R609 Edema, unspecified: Secondary | ICD-10-CM | POA: Diagnosis not present

## 2024-02-09 DIAGNOSIS — Z79899 Other long term (current) drug therapy: Secondary | ICD-10-CM | POA: Diagnosis not present

## 2024-02-09 DIAGNOSIS — Z96652 Presence of left artificial knee joint: Secondary | ICD-10-CM | POA: Diagnosis not present

## 2024-02-09 DIAGNOSIS — Z471 Aftercare following joint replacement surgery: Secondary | ICD-10-CM | POA: Diagnosis not present

## 2024-02-09 HISTORY — PX: TOTAL KNEE ARTHROPLASTY: SHX125

## 2024-02-09 LAB — CBC WITH DIFFERENTIAL/PLATELET
Basophils Absolute: 0.1 10*3/uL (ref 0.0–0.2)
Basos: 1 %
EOS (ABSOLUTE): 0.2 10*3/uL (ref 0.0–0.4)
Eos: 2 %
Hematocrit: 43.2 % (ref 37.5–51.0)
Hemoglobin: 14.1 g/dL (ref 13.0–17.7)
Immature Grans (Abs): 0.1 10*3/uL (ref 0.0–0.1)
Immature Granulocytes: 1 %
Lymphocytes Absolute: 2 10*3/uL (ref 0.7–3.1)
Lymphs: 20 %
MCH: 30.8 pg (ref 26.6–33.0)
MCHC: 32.6 g/dL (ref 31.5–35.7)
MCV: 94 fL (ref 79–97)
Monocytes Absolute: 0.6 10*3/uL (ref 0.1–0.9)
Monocytes: 6 %
Neutrophils Absolute: 6.9 10*3/uL (ref 1.4–7.0)
Neutrophils: 70 %
Platelets: 353 10*3/uL (ref 150–450)
RBC: 4.58 x10E6/uL (ref 4.14–5.80)
RDW: 12.6 % (ref 11.6–15.4)
WBC: 9.8 10*3/uL (ref 3.4–10.8)

## 2024-02-09 LAB — CMP14+EGFR
ALT: 17 IU/L (ref 0–44)
AST: 11 IU/L (ref 0–40)
Albumin: 4 g/dL (ref 3.9–4.9)
Alkaline Phosphatase: 86 IU/L (ref 44–121)
BUN/Creatinine Ratio: 22 (ref 10–24)
BUN: 34 mg/dL — ABNORMAL HIGH (ref 8–27)
Bilirubin Total: 0.4 mg/dL (ref 0.0–1.2)
CO2: 22 mmol/L (ref 20–29)
Calcium: 9.1 mg/dL (ref 8.6–10.2)
Chloride: 102 mmol/L (ref 96–106)
Creatinine, Ser: 1.55 mg/dL — ABNORMAL HIGH (ref 0.76–1.27)
Globulin, Total: 2.4 g/dL (ref 1.5–4.5)
Glucose: 119 mg/dL — ABNORMAL HIGH (ref 70–99)
Potassium: 4.5 mmol/L (ref 3.5–5.2)
Sodium: 141 mmol/L (ref 134–144)
Total Protein: 6.4 g/dL (ref 6.0–8.5)
eGFR: 49 mL/min/{1.73_m2} — ABNORMAL LOW (ref >59)

## 2024-02-09 LAB — LIPID PANEL
Chol/HDL Ratio: 4.1 ratio (ref 0.0–5.0)
Cholesterol, Total: 134 mg/dL (ref 100–199)
HDL: 33 mg/dL — ABNORMAL LOW (ref >39)
LDL Chol Calc (NIH): 85 mg/dL (ref 0–99)
Triglycerides: 79 mg/dL (ref 0–149)
VLDL Cholesterol Cal: 16 mg/dL (ref 5–40)

## 2024-02-09 LAB — HCV AB W REFLEX TO QUANT PCR: HCV Ab: NONREACTIVE

## 2024-02-09 LAB — B12 AND FOLATE PANEL
Folate: 5.6 ng/mL (ref >3.0)
Vitamin B-12: 347 pg/mL (ref 232–1245)

## 2024-02-09 LAB — TSH+FREE T4
Free T4: 1.61 ng/dL (ref 0.82–1.77)
TSH: 1.41 u[IU]/mL (ref 0.450–4.500)

## 2024-02-09 LAB — HCV INTERPRETATION

## 2024-02-09 LAB — HEMOGLOBIN A1C
Est. average glucose Bld gHb Est-mCnc: 143 mg/dL
Hgb A1c MFr Bld: 6.6 % — ABNORMAL HIGH (ref 4.8–5.6)

## 2024-02-09 LAB — VITAMIN D 25 HYDROXY (VIT D DEFICIENCY, FRACTURES): Vit D, 25-Hydroxy: 18.5 ng/mL — ABNORMAL LOW (ref 30.0–100.0)

## 2024-02-09 SURGERY — ARTHROPLASTY, KNEE, TOTAL
Anesthesia: Spinal | Site: Knee | Laterality: Left

## 2024-02-09 MED ORDER — KETOROLAC TROMETHAMINE 15 MG/ML IJ SOLN
INTRAMUSCULAR | Status: AC
  Filled 2024-02-09: qty 1

## 2024-02-09 MED ORDER — OXYCODONE HCL 5 MG PO TABS
5.0000 mg | ORAL_TABLET | ORAL | 0 refills | Status: DC | PRN
  Filled 2024-02-09: qty 40, 4d supply, fill #0

## 2024-02-09 MED ORDER — BUPIVACAINE-EPINEPHRINE (PF) 0.5% -1:200000 IJ SOLN
INTRAMUSCULAR | Status: DC | PRN
  Administered 2024-02-09: 30 mL via PERINEURAL

## 2024-02-09 MED ORDER — DILTIAZEM HCL ER COATED BEADS 180 MG PO CP24
360.0000 mg | ORAL_CAPSULE | Freq: Every day | ORAL | Status: DC
  Administered 2024-02-09 – 2024-02-10 (×2): 360 mg via ORAL

## 2024-02-09 MED ORDER — EPINEPHRINE PF 1 MG/ML IJ SOLN
INTRAMUSCULAR | Status: AC
  Filled 2024-02-09: qty 1

## 2024-02-09 MED ORDER — DEXAMETHASONE SODIUM PHOSPHATE 10 MG/ML IJ SOLN
INTRAMUSCULAR | Status: AC
  Filled 2024-02-09: qty 1

## 2024-02-09 MED ORDER — PANTOPRAZOLE SODIUM 40 MG PO TBEC
40.0000 mg | DELAYED_RELEASE_TABLET | Freq: Two times a day (BID) | ORAL | Status: DC
  Administered 2024-02-09 – 2024-02-10 (×2): 40 mg via ORAL
  Filled 2024-02-09 (×2): qty 1

## 2024-02-09 MED ORDER — SODIUM CHLORIDE (PF) 0.9 % IJ SOLN
INTRAMUSCULAR | Status: AC
  Filled 2024-02-09: qty 20

## 2024-02-09 MED ORDER — DEXAMETHASONE SODIUM PHOSPHATE 10 MG/ML IJ SOLN
INTRAMUSCULAR | Status: DC | PRN
  Administered 2024-02-09: 10 mg via INTRAVENOUS

## 2024-02-09 MED ORDER — SODIUM CHLORIDE 0.9 % BOLUS PEDS
250.0000 mL | Freq: Once | INTRAVENOUS | Status: AC
  Administered 2024-02-09: 250 mL via INTRAVENOUS

## 2024-02-09 MED ORDER — APIXABAN 2.5 MG PO TABS: 2.5000 mg | ORAL_TABLET | Freq: Two times a day (BID) | ORAL | 0 refills | Status: DC

## 2024-02-09 MED ORDER — PRAVASTATIN SODIUM 20 MG PO TABS
10.0000 mg | ORAL_TABLET | Freq: Every evening | ORAL | Status: DC
  Administered 2024-02-09: 10 mg via ORAL
  Filled 2024-02-09: qty 1

## 2024-02-09 MED ORDER — CEFAZOLIN SODIUM-DEXTROSE 2-4 GM/100ML-% IV SOLN
2.0000 g | INTRAVENOUS | Status: AC
  Administered 2024-02-09: 2 g via INTRAVENOUS

## 2024-02-09 MED ORDER — SODIUM CHLORIDE 0.9 % BOLUS PEDS: 250.0000 mL | Freq: Once | INTRAVENOUS | Status: DC

## 2024-02-09 MED ORDER — DIPHENHYDRAMINE HCL 12.5 MG/5ML PO ELIX: 12.5000 mg | ORAL_SOLUTION | ORAL | Status: DC | PRN

## 2024-02-09 MED ORDER — DILTIAZEM HCL ER COATED BEADS 180 MG PO CP24
ORAL_CAPSULE | ORAL | Status: AC
  Filled 2024-02-09: qty 2

## 2024-02-09 MED ORDER — BUPIVACAINE HCL (PF) 0.5 % IJ SOLN
INTRAMUSCULAR | Status: DC | PRN
  Administered 2024-02-09: 2.5 mL

## 2024-02-09 MED ORDER — BISACODYL 10 MG RE SUPP: 10.0000 mg | Freq: Every day | RECTAL | Status: DC | PRN

## 2024-02-09 MED ORDER — SODIUM CHLORIDE 0.9 % IV SOLN: INTRAVENOUS | Status: AC

## 2024-02-09 MED ORDER — ONDANSETRON HCL 4 MG PO TABS: 4.0000 mg | ORAL_TABLET | Freq: Four times a day (QID) | ORAL | Status: DC | PRN

## 2024-02-09 MED ORDER — FENTANYL CITRATE (PF) 100 MCG/2ML IJ SOLN
INTRAMUSCULAR | Status: DC | PRN
  Administered 2024-02-09: 50 ug via INTRAVENOUS

## 2024-02-09 MED ORDER — TRIAMCINOLONE ACETONIDE 40 MG/ML IJ SUSP
INTRAMUSCULAR | Status: DC | PRN
  Administered 2024-02-09: 80 mg via INTRAMUSCULAR

## 2024-02-09 MED ORDER — ACETAMINOPHEN 10 MG/ML IV SOLN
INTRAVENOUS | Status: DC | PRN
  Administered 2024-02-09: 1000 mg via INTRAVENOUS

## 2024-02-09 MED ORDER — 0.9 % SODIUM CHLORIDE (POUR BTL) OPTIME
TOPICAL | Status: DC | PRN
  Administered 2024-02-09: 500 mL

## 2024-02-09 MED ORDER — ACETAMINOPHEN 325 MG PO TABS: 325.0000 mg | ORAL_TABLET | Freq: Four times a day (QID) | ORAL | Status: DC | PRN

## 2024-02-09 MED ORDER — OXYCODONE HCL 5 MG PO TABS: 5.0000 mg | ORAL_TABLET | Freq: Once | ORAL | Status: DC | PRN

## 2024-02-09 MED ORDER — ACETAMINOPHEN 10 MG/ML IV SOLN
INTRAVENOUS | Status: AC
  Filled 2024-02-09: qty 100

## 2024-02-09 MED ORDER — MIDAZOLAM HCL 2 MG/2ML IJ SOLN
INTRAMUSCULAR | Status: AC
  Filled 2024-02-09: qty 2

## 2024-02-09 MED ORDER — FLEET ENEMA RE ENEM: 1.0000 | ENEMA | Freq: Once | RECTAL | Status: DC | PRN

## 2024-02-09 MED ORDER — ONDANSETRON HCL 4 MG/2ML IJ SOLN: 4.0000 mg | Freq: Once | INTRAMUSCULAR | Status: DC | PRN

## 2024-02-09 MED ORDER — KETOROLAC TROMETHAMINE 15 MG/ML IJ SOLN
15.0000 mg | Freq: Once | INTRAMUSCULAR | Status: AC
  Administered 2024-02-09: 15 mg via INTRAVENOUS

## 2024-02-09 MED ORDER — METOCLOPRAMIDE HCL 5 MG/ML IJ SOLN: 5.0000 mg | Freq: Three times a day (TID) | INTRAMUSCULAR | Status: DC | PRN

## 2024-02-09 MED ORDER — ONDANSETRON HCL 4 MG/2ML IJ SOLN: 4.0000 mg | Freq: Four times a day (QID) | INTRAMUSCULAR | Status: DC | PRN

## 2024-02-09 MED ORDER — FENTANYL CITRATE (PF) 100 MCG/2ML IJ SOLN
INTRAMUSCULAR | Status: AC
Start: 2024-02-09 — End: ?
  Filled 2024-02-09: qty 2

## 2024-02-09 MED ORDER — KETOROLAC TROMETHAMINE 30 MG/ML IJ SOLN
INTRAMUSCULAR | Status: DC | PRN
  Administered 2024-02-09: 30 mg via INTRAMUSCULAR

## 2024-02-09 MED ORDER — BUPIVACAINE LIPOSOME 1.3 % IJ SUSP
INTRAMUSCULAR | Status: AC
  Filled 2024-02-09: qty 20

## 2024-02-09 MED ORDER — PROPOFOL 1000 MG/100ML IV EMUL
INTRAVENOUS | Status: AC
  Filled 2024-02-09: qty 100

## 2024-02-09 MED ORDER — ORAL CARE MOUTH RINSE: 15.0000 mL | Freq: Once | OROMUCOSAL | Status: AC

## 2024-02-09 MED ORDER — BUPIVACAINE HCL (PF) 0.5 % IJ SOLN
INTRAMUSCULAR | Status: AC
  Filled 2024-02-09: qty 30

## 2024-02-09 MED ORDER — CEFAZOLIN SODIUM-DEXTROSE 2-4 GM/100ML-% IV SOLN
INTRAVENOUS | Status: AC
  Filled 2024-02-09: qty 100

## 2024-02-09 MED ORDER — CHLORHEXIDINE GLUCONATE 0.12 % MT SOLN
OROMUCOSAL | Status: AC
  Filled 2024-02-09: qty 15

## 2024-02-09 MED ORDER — CHLORHEXIDINE GLUCONATE 0.12 % MT SOLN
15.0000 mL | Freq: Once | OROMUCOSAL | Status: AC
  Administered 2024-02-09: 15 mL via OROMUCOSAL

## 2024-02-09 MED ORDER — METOCLOPRAMIDE HCL 10 MG PO TABS: 5.0000 mg | ORAL_TABLET | Freq: Three times a day (TID) | ORAL | Status: DC | PRN

## 2024-02-09 MED ORDER — FENTANYL CITRATE (PF) 100 MCG/2ML IJ SOLN: 25.0000 ug | INTRAMUSCULAR | Status: DC | PRN

## 2024-02-09 MED ORDER — OXYCODONE HCL 5 MG PO TABS
5.0000 mg | ORAL_TABLET | ORAL | Status: DC | PRN
  Administered 2024-02-09 – 2024-02-10 (×3): 5 mg via ORAL
  Filled 2024-02-09 (×4): qty 1

## 2024-02-09 MED ORDER — TRIAZOLAM 0.25 MG PO TABS
0.2500 mg | ORAL_TABLET | Freq: Every evening | ORAL | Status: DC | PRN
  Administered 2024-02-09: 0.25 mg via ORAL
  Filled 2024-02-09 (×2): qty 1

## 2024-02-09 MED ORDER — PHENYLEPHRINE 80 MCG/ML (10ML) SYRINGE FOR IV PUSH (FOR BLOOD PRESSURE SUPPORT)
PREFILLED_SYRINGE | INTRAVENOUS | Status: DC | PRN
  Administered 2024-02-09: 40 ug via INTRAVENOUS
  Administered 2024-02-09 (×6): 80 ug via INTRAVENOUS

## 2024-02-09 MED ORDER — DOCUSATE SODIUM 100 MG PO CAPS
100.0000 mg | ORAL_CAPSULE | Freq: Two times a day (BID) | ORAL | Status: DC
  Administered 2024-02-09 – 2024-02-10 (×3): 100 mg via ORAL
  Filled 2024-02-09 (×3): qty 1

## 2024-02-09 MED ORDER — TRANEXAMIC ACID-NACL 1000-0.7 MG/100ML-% IV SOLN
1000.0000 mg | INTRAVENOUS | Status: AC
  Administered 2024-02-09: 1000 mg via INTRAVENOUS

## 2024-02-09 MED ORDER — ACETAMINOPHEN 10 MG/ML IV SOLN: 1000.0000 mg | Freq: Once | INTRAVENOUS | Status: DC | PRN

## 2024-02-09 MED ORDER — ONDANSETRON HCL 4 MG/2ML IJ SOLN
INTRAMUSCULAR | Status: DC | PRN
  Administered 2024-02-09: 4 mg via INTRAVENOUS

## 2024-02-09 MED ORDER — CEFAZOLIN SODIUM-DEXTROSE 2-4 GM/100ML-% IV SOLN
2.0000 g | Freq: Four times a day (QID) | INTRAVENOUS | Status: AC
  Administered 2024-02-09 (×2): 2 g via INTRAVENOUS
  Filled 2024-02-09 (×2): qty 100

## 2024-02-09 MED ORDER — LIDOCAINE HCL (PF) 2 % IJ SOLN
INTRAMUSCULAR | Status: AC
  Filled 2024-02-09: qty 5

## 2024-02-09 MED ORDER — TRANEXAMIC ACID-NACL 1000-0.7 MG/100ML-% IV SOLN
INTRAVENOUS | Status: AC
  Filled 2024-02-09: qty 100

## 2024-02-09 MED ORDER — LIDOCAINE HCL (CARDIAC) PF 100 MG/5ML IV SOSY
PREFILLED_SYRINGE | INTRAVENOUS | Status: DC | PRN
  Administered 2024-02-09: 100 mg via INTRAVENOUS

## 2024-02-09 MED ORDER — MAGNESIUM HYDROXIDE 400 MG/5ML PO SUSP: 30.0000 mL | Freq: Every day | ORAL | Status: DC | PRN

## 2024-02-09 MED ORDER — SPIRONOLACTONE 12.5 MG HALF TABLET
12.5000 mg | ORAL_TABLET | Freq: Every day | ORAL | Status: DC
Start: 2024-02-09 — End: 2024-02-10
  Administered 2024-02-09 – 2024-02-10 (×2): 12.5 mg via ORAL
  Filled 2024-02-09 (×2): qty 1

## 2024-02-09 MED ORDER — OXYCODONE HCL 5 MG/5ML PO SOLN: 5.0000 mg | Freq: Once | ORAL | Status: DC | PRN

## 2024-02-09 MED ORDER — APIXABAN 2.5 MG PO TABS
2.5000 mg | ORAL_TABLET | Freq: Two times a day (BID) | ORAL | 0 refills | Status: DC
  Filled 2024-02-09: qty 60, 30d supply, fill #0

## 2024-02-09 MED ORDER — APIXABAN 2.5 MG PO TABS
2.5000 mg | ORAL_TABLET | Freq: Two times a day (BID) | ORAL | 0 refills | Status: DC
  Filled 2024-02-09: qty 30, 15d supply, fill #0

## 2024-02-09 MED ORDER — TRIAMCINOLONE ACETONIDE 40 MG/ML IJ SUSP
INTRAMUSCULAR | Status: AC
  Filled 2024-02-09: qty 2

## 2024-02-09 MED ORDER — VASOPRESSIN 20 UNIT/ML IV SOLN
INTRAVENOUS | Status: AC
  Filled 2024-02-09: qty 1

## 2024-02-09 MED ORDER — APIXABAN 2.5 MG PO TABS
2.5000 mg | ORAL_TABLET | Freq: Two times a day (BID) | ORAL | Status: DC
  Administered 2024-02-10: 2.5 mg via ORAL
  Filled 2024-02-09: qty 1

## 2024-02-09 MED ORDER — PROPOFOL 500 MG/50ML IV EMUL
INTRAVENOUS | Status: DC | PRN
  Administered 2024-02-09: 100 ug/kg/min via INTRAVENOUS

## 2024-02-09 MED ORDER — KETOROLAC TROMETHAMINE 15 MG/ML IJ SOLN
7.5000 mg | Freq: Four times a day (QID) | INTRAMUSCULAR | Status: AC
  Administered 2024-02-09 – 2024-02-10 (×4): 7.5 mg via INTRAVENOUS
  Filled 2024-02-09 (×3): qty 1

## 2024-02-09 MED ORDER — ACETAMINOPHEN 500 MG PO TABS
1000.0000 mg | ORAL_TABLET | Freq: Four times a day (QID) | ORAL | Status: AC
  Administered 2024-02-09 – 2024-02-10 (×4): 1000 mg via ORAL
  Filled 2024-02-09 (×5): qty 2

## 2024-02-09 MED ORDER — KETOROLAC TROMETHAMINE 30 MG/ML IJ SOLN
INTRAMUSCULAR | Status: AC
  Filled 2024-02-09: qty 1

## 2024-02-09 MED ORDER — MIDAZOLAM HCL 5 MG/5ML IJ SOLN
INTRAMUSCULAR | Status: DC | PRN
  Administered 2024-02-09: 2 mg via INTRAVENOUS

## 2024-02-09 MED ORDER — ONDANSETRON HCL 4 MG/2ML IJ SOLN
INTRAMUSCULAR | Status: AC
  Filled 2024-02-09: qty 2

## 2024-02-09 MED ORDER — SODIUM CHLORIDE 0.9 % IV SOLN
INTRAVENOUS | Status: DC | PRN
  Administered 2024-02-09: 60 mL

## 2024-02-09 MED ORDER — LACTATED RINGERS IV SOLN: INTRAVENOUS | Status: DC

## 2024-02-09 SURGICAL SUPPLY — 59 items
BLADE SAW 90X13X1.19 OSCILLAT (BLADE) ×1 IMPLANT
BLADE SAW SAG 25X90X1.19 (BLADE) ×1 IMPLANT
BLADE SURG SZ20 CARB STEEL (BLADE) ×1 IMPLANT
BNDG ELASTIC 6X5.8 VLCR NS LF (GAUZE/BANDAGES/DRESSINGS) ×1 IMPLANT
CEMENT VACUUM MIXING SYSTEM (MISCELLANEOUS) ×1 IMPLANT
CHLORAPREP W/TINT 26 (MISCELLANEOUS) ×1 IMPLANT
COMP FEM KNEE STD PS 8 LT (Joint) ×1 IMPLANT
COMP PATELLA PEG 3 32 (Joint) ×1 IMPLANT
COMP TIB KNEE PS 0D LT (Joint) ×1 IMPLANT
COMPONENT FEM KNEE STD PS 8 LT (Joint) IMPLANT
COMPONENT PATELLA PEG 3 32 (Joint) IMPLANT
COMPONENT TIB KNEE PS 0D LT (Joint) IMPLANT
COOLER POLAR GLACIER W/PUMP (MISCELLANEOUS) ×1 IMPLANT
COVER MAYO STAND STRL (DRAPES) ×1 IMPLANT
CUFF TOURN SGL QUICK 30 NS (TOURNIQUET CUFF) IMPLANT
CUFF TRNQT CYL 24X4X16.5-23 (TOURNIQUET CUFF) IMPLANT
CUFF TRNQT CYL 34X4.125X (TOURNIQUET CUFF) IMPLANT
DRAPE IMP U-DRAPE 54X76 (DRAPES) ×1 IMPLANT
DRAPE SHEET LG 3/4 BI-LAMINATE (DRAPES) ×1 IMPLANT
DRAPE U-SHAPE 47X51 STRL (DRAPES) ×1 IMPLANT
DRSG MEPILEX SACRM 8.7X9.8 (GAUZE/BANDAGES/DRESSINGS) IMPLANT
DRSG OPSITE POSTOP 4X10 (GAUZE/BANDAGES/DRESSINGS) ×1 IMPLANT
DRSG OPSITE POSTOP 4X8 (GAUZE/BANDAGES/DRESSINGS) ×1 IMPLANT
ELECT CAUTERY BLADE 6.4 (BLADE) ×1 IMPLANT
ELECT REM PT RETURN 9FT ADLT (ELECTROSURGICAL) ×1 IMPLANT
ELECTRODE REM PT RTRN 9FT ADLT (ELECTROSURGICAL) ×1 IMPLANT
GAUZE XEROFORM 1X8 LF (GAUZE/BANDAGES/DRESSINGS) ×1 IMPLANT
GLOVE BIO SURGEON STRL SZ7 (GLOVE) IMPLANT
GLOVE BIO SURGEON STRL SZ7.5 (GLOVE) ×4 IMPLANT
GLOVE BIO SURGEON STRL SZ8 (GLOVE) ×4 IMPLANT
GLOVE BIOGEL PI IND STRL 8 (GLOVE) ×1 IMPLANT
GLOVE INDICATOR 8.0 STRL GRN (GLOVE) ×1 IMPLANT
GOWN STRL REUS W/ TWL LRG LVL3 (GOWN DISPOSABLE) ×1 IMPLANT
GOWN STRL REUS W/ TWL XL LVL3 (GOWN DISPOSABLE) ×1 IMPLANT
HOOD PEEL AWAY T7 (MISCELLANEOUS) ×3 IMPLANT
IV NS IRRIG 3000ML ARTHROMATIC (IV SOLUTION) ×1 IMPLANT
KIT TURNOVER KIT A (KITS) ×1 IMPLANT
MANIFOLD NEPTUNE II (INSTRUMENTS) ×1 IMPLANT
NDL SPNL 20GX3.5 QUINCKE YW (NEEDLE) ×1 IMPLANT
NEEDLE SPNL 20GX3.5 QUINCKE YW (NEEDLE) ×1 IMPLANT
NS IRRIG 500ML POUR BTL (IV SOLUTION) ×1 IMPLANT
PACK TOTAL KNEE (MISCELLANEOUS) ×1 IMPLANT
PAD WRAPON POLAR KNEE (MISCELLANEOUS) ×1 IMPLANT
PENCIL SMOKE EVACUATOR (MISCELLANEOUS) ×1 IMPLANT
PIN DRILL HDLS TROCAR 75 4PK (PIN) IMPLANT
PULSAVAC PLUS IRRIG FAN TIP (DISPOSABLE) ×1 IMPLANT
SCREW FEMALE HEX FIX 25X2.5 (ORTHOPEDIC DISPOSABLE SUPPLIES) IMPLANT
STAPLER SKIN PROX 35W (STAPLE) ×1 IMPLANT
STEM ARTISURF EF 12 SZ8-11 (Stem) IMPLANT
SUCTION TUBE FRAZIER 10FR DISP (SUCTIONS) ×1 IMPLANT
SUT VIC AB 0 CT1 36 (SUTURE) ×3 IMPLANT
SUT VIC AB 2-0 CT1 TAPERPNT 27 (SUTURE) ×3 IMPLANT
SYR 10ML LL (SYRINGE) ×1 IMPLANT
SYR 20ML LL LF (SYRINGE) ×1 IMPLANT
SYR 30ML LL (SYRINGE) IMPLANT
TIP FAN IRRIG PULSAVAC PLUS (DISPOSABLE) ×1 IMPLANT
TRAP FLUID SMOKE EVACUATOR (MISCELLANEOUS) ×1 IMPLANT
WATER STERILE IRR 500ML POUR (IV SOLUTION) ×1 IMPLANT
WRAPON POLAR PAD KNEE (MISCELLANEOUS) ×1 IMPLANT

## 2024-02-09 NOTE — Progress Notes (Signed)
 Patient is not able to walk the distance required to go the bathroom, or he/she is unable to safely negotiate stairs required to access the bathroom.  A 3in1 BSC will alleviate this problem

## 2024-02-09 NOTE — Op Note (Signed)
 02/09/2024  12:43 PM  Patient:   Ralph Marquez  Pre-Op Diagnosis:   Degenerative joint disease, left knee.  Post-Op Diagnosis:   Same  Procedure:   Left TKA using all-pressfit Zimmer Persona system with a #8 PCR femur, a(n) F-sized  tibial tray with a 12 mm medial congruent E-poly insert, and a 9 x 32 Gy mm all-poly 3-pegged domed patella.  Surgeon:   Maryagnes Amos, MD  Assistant:   Horris Latino, PA-C   Anesthesia:   Spinal  Findings:   As above  Complications:   None  EBL:   10 cc  Fluids:   300 cc crystalloid  UOP:   None  TT:   76 minutes at 300 mmHg  Drains:   None  Closure:   Staples  Implants:   As above  Brief Clinical Note:   The patient is a 68 year old male with a long history of progressively worsening left knee pain. The patient's symptoms have progressed despite medications, activity modification, injections, etc. The patient's history and examination were consistent with advanced degenerative joint disease of the left knee confirmed by plain radiographs. The patient presents at this time for a left total knee arthroplasty.  Procedure:   The patient was brought into the operating room. After adequate spinal anesthesia was obtained, the patient was repositioned in the supine position on the operating room table. The left lower extremity was prepped with ChloraPrep solution and draped sterilely. Preoperative antibiotics were administered. A timeout was performed to verify the appropriate surgical site before the limb was exsanguinated with an Esmarch and the tourniquet inflated to 300 mmHg.   A standard anterior approach to the knee was made through an approximately 6-7 inch incision. The incision was carried down through the subcutaneous tissues to expose superficial retinaculum. This was split the length of the incision and the medial flap elevated sufficiently to expose the medial retinaculum. The medial retinaculum was incised, leaving a 3-4 mm cuff of tissue  on the patella. This was extended distally along the medial border of the patellar tendon and proximally through the medial third of the quadriceps tendon. A subtotal fat pad excision was performed before the soft tissues were elevated off the anteromedial and anterolateral aspects of the proximal tibia to the level of the collateral ligaments. The anterior portions of the medial and lateral menisci were removed, as was the anterior cruciate ligament. With the knee flexed to 90, the external tibial guide was positioned and the appropriate proximal tibial cut made. This piece was taken to the back table where it was measured and found to be optimally replicated by a(n) F-sized component.  Attention was directed to the distal femur. The intramedullary canal was accessed through a 3/8" drill hole. The intramedullary guide was inserted and placed at 5 of valgus alignment. Using the +0 slot, the distal cut was made. The distal femur was measured and found to be optimally replicated by the #8 component. The #8 4-in-1 cutting block was positioned and first the posterior, then the posterior chamfer, the anterior, and finally the anterior chamfer cuts were made after verifying that the anterior cortex would not be notched.   At this point, the posterior portions medial and lateral menisci were removed. A trial reduction was performed using the appropriate femoral and tibial components with first the 10 mm and then the 12 mm insert. The 12 mm insert demonstrated excellent stability to varus and valgus stressing both in flexion and extension while permitting full  extension. Patellar tracking was assessed and found to be excellent. The tibial trial position was marked on the proximal tibia. The patella thickness was measured and found to be 20 mm, so the appropriate cut was made. The patellar surface was measured and found to be optimally replicated by the 32 mm component. The three peg holes were drilled in place before  the trial button was inserted. Patella tracking was assessed and found to be excellent, passing the "no thumb test". The lug holes were drilled into the distal femur before the trial component was removed.  The tibial tray was repositioned before the keel was created using the appropriate tower, drills, and punch.  The bony surfaces were prepared for implantation by irrigating them thoroughly with sterile saline solution via the jet lavage system. A bone plug was fashioned from some of the bone that had been removed previously and used to plug the distal femoral canal. In addition, a "cocktail" of 20 cc of Exparel, 30 cc of 0.5% Sensorcaine, 2 cc of Kenalog 40 (80 mg), and 30 mg of Toradol diluted out to 90 cc with normal saline was injected into the postero-medial and postero-lateral aspects of the knee, the medial and lateral gutter regions, and the peri-incisional tissues to help with postoperative analgesia.    The tibial tray was impacted into place first with care taken to be sure the component was fully seated. Next, the femoral component was impacted into place again with care taken to be sure that the component was fully improperly seated. The permanent 12 mm medial congruent E-polyethylene insert was snapped into place with care taken to ensure appropriate locking of the insert. Finally, the patella was positioned and compressed into place using the patellar clamp. Again, care was taken to be sure that the component was fully seated. The knee was placed through a range of motion with the findings as described above.    The wound was copiously irrigated with sterile saline solution using the jet lavage system before the quadriceps tendon and retinacular layer were reapproximated using #0 Vicryl interrupted sutures. The superficial retinacular layer also was closed using a running #0 Vicryl suture. The subcutaneous tissues were closed in several layers using 2-0 Vicryl interrupted sutures. The skin was  closed using staples. A sterile honeycomb dressing was applied to the skin before the leg was wrapped with an Ace wrap to accommodate the Polar Care device. The patient was then awakened and returned to the recovery room in satisfactory condition after tolerating the procedure well.

## 2024-02-09 NOTE — Transfer of Care (Signed)
 Immediate Anesthesia Transfer of Care Note  Patient: Ralph Marquez  Procedure(s) Performed: TOTAL KNEE ARTHROPLASTY (Left: Knee)  Patient Location: PACU  Anesthesia Type:Spinal  Level of Consciousness: awake  Airway & Oxygen Therapy: Patient Spontanous Breathing and Patient connected to face mask oxygen  Post-op Assessment: Report given to RN and Post -op Vital signs reviewed and stable  Post vital signs: Reviewed and stable  Last Vitals:  Vitals Value Taken Time  BP 113/78 02/09/24 1246  Temp    Pulse 70 02/09/24 1249  Resp 16 02/09/24 1249  SpO2 97 % 02/09/24 1249  Vitals shown include unfiled device data.  Last Pain:  Vitals:   02/09/24 0903  TempSrc: Tympanic  PainSc: 5          Complications: No notable events documented.

## 2024-02-09 NOTE — Anesthesia Procedure Notes (Deleted)
 Procedure Name: MAC Date/Time: 02/09/2024 10:30 AM  Performed by: Susy Manor, RNPre-anesthesia Checklist: Patient identified, Emergency Drugs available, Timeout performed, Patient being monitored and Suction available Oxygen Delivery Method: Simple face mask Preoxygenation: Pre-oxygenation with 100% oxygen Induction Type: IV induction

## 2024-02-09 NOTE — Anesthesia Preprocedure Evaluation (Signed)
 Anesthesia Evaluation  Patient identified by MRN, date of birth, ID band Patient awake    Reviewed: Allergy & Precautions, NPO status , Patient's Chart, lab work & pertinent test results  History of Anesthesia Complications Negative for: history of anesthetic complications  Airway Mallampati: III  TM Distance: >3 FB Neck ROM: Full    Dental  (+) Partial Upper   Pulmonary neg pulmonary ROS, neg sleep apnea, neg COPD, Patient abstained from smoking.Not current smoker, former smoker   Pulmonary exam normal breath sounds clear to auscultation       Cardiovascular Exercise Tolerance: Good METShypertension, Pt. on medications (-) CAD and (-) Past MI (-) dysrhythmias  Rhythm:Regular Rate:Normal - Systolic murmurs    Neuro/Psych negative neurological ROS  negative psych ROS   GI/Hepatic ,GERD  Medicated and Controlled,,(+)     (-) substance abuse    Endo/Other  neg diabetes    Renal/GU negative Renal ROS     Musculoskeletal  (+) Arthritis ,    Abdominal   Peds  Hematology Denies blood thinner use or bleeding disorders.    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 No date: GERD (gastroesophageal reflux disease) No date: Hypertension 01/29/2024: Psychophysiologic insomnia  Reproductive/Obstetrics                             Anesthesia Physical Anesthesia Plan  ASA: 2  Anesthesia Plan: Spinal   Post-op Pain Management: Ofirmev IV (intra-op)*   Induction: Intravenous  PONV Risk Score and Plan: 1 and Ondansetron, Dexamethasone, Propofol infusion, TIVA and Midazolam  Airway Management Planned: Natural Airway  Additional Equipment: None  Intra-op Plan:   Post-operative Plan:   Informed Consent: I have reviewed the patients History and Physical, chart, labs and discussed the procedure including the risks, benefits and alternatives  for the proposed anesthesia with the patient or authorized representative who has indicated his/her understanding and acceptance.       Plan Discussed with: CRNA and Surgeon  Anesthesia Plan Comments: (Discussed R/B/A of neuraxial anesthesia technique with patient: - rare risks of spinal/epidural hematoma, nerve damage, infection - Risk of PDPH - Risk of nausea and vomiting - Risk of conversion to general anesthesia and its associated risks, including sore throat, damage to lips/eyes/teeth/oropharynx, and rare risks such as cardiac and respiratory events. - Risk of allergic reactions  Discussed the role of CRNA in patient's perioperative care.  Patient voiced understanding.)       Anesthesia Quick Evaluation

## 2024-02-09 NOTE — H&P (Addendum)
 History of Present Illness:  Ruby Dilone is a 68 y.o. male who has severe left knee pain. The patient reports a longstanding history of left knee pain that has persisted. He states that the pain is achy and throbbing like along the anterior margins of the medial and lateral joint line. He denies any locking symptoms. Admits to intermittent swelling and tenderness. No numbness, tingling or radiation of symptoms. He has previously seen Horris Latino for this issue and failed conservative treatment including Tylenol, intra-articular corticosteroid injections, NSAID's, and activity modification. He has requested operative intervention for relief of his DJD symptoms. He denies any cardiac or pulmonary history. Denies any history of DVTs or clots. Does admit to having a history of a brain aneurysm. Patient of note does have a history of a right total knee replacement performed. Patient is not a diabetic. He does state that he has an allergy to Betadine. Also states that he wishes to do outpatient PT after 2 weeks of at home PT at [facility] outpatient rehab.   Social Hx: Patient lives at home with his wife and caretaker. 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 He denies any alcohol use. Denies any smoking or nicotine use. No illicit drug use.  Allergies: Amlodipine Hives  Lisinopril Hives  Losartan Potassium Hives  Betadine [Povidone-Iodine] Hives  Sulfa (Sulfonamide Antibiotics) Itching  Iodine Rash  Valsartan Rash   Current Outpatient Medications: aspirin 81 MG EC tablet Take 81 mg by mouth once daily  diclofenac (VOLTAREN) 75 MG EC tablet Take by mouth (Patient not taking: Reported on 11/30/2023)  dilTIAZem (CARDIZEM CD) 360 MG CD capsule Take 1 capsule by mouth once daily  folic acid (FOLVITE) 1 MG tablet (Patient not taking: Reported on 11/30/2023)  LAGEVRIO, EUA, 200 mg capsule take 4 capsules by mouth every 12 hours for 5 days (Patient not taking: Reported on 11/30/2023)   leflunomide (ARAVA) 10 MG tablet (Patient not taking: Reported on 11/30/2023)  methotrexate (RHEUMATREX) 2.5 MG tablet (Patient not taking: Reported on 11/30/2023)  naproxen (NAPROSYN) 500 MG tablet Take 500 mg by mouth 2 (two) times daily with meals (Patient not taking: Reported on 11/30/2023)  pantoprazole (PROTONIX) 40 MG DR tablet Take 1 tablet by mouth once daily  pravastatin (PRAVACHOL) 10 MG tablet Take 1 tablet by mouth once daily  predniSONE (DELTASONE) 5 MG tablet (Patient not taking: Reported on 11/30/2023)  spironolactone (ALDACTONE) 25 MG tablet Take 0.5 tablets by mouth once daily  traMADoL (ULTRAM) 50 mg tablet Take 1 tablet by mouth as needed (Patient not taking: Reported on 11/30/2023)  triazolam (HALCION) 0.25 MG tablet Take 1 tablet by mouth at bedtime   Past Medical History:  Cerebral aneurysm (HHS-HCC)  Hypercholesterolemia  Hypertension  Stroke (CMS/HHS-HCC) 2018 (Mild)   Past Surgical History:  Right Shoulder Arthroscopy Right 2004  Right Total Knee Arthroplasty Right 2016  Left Catarat Extraction Left 2019   Physical Exam:  Ht:177.8 cm (5\' 10" ) Wt: BMI: Body mass index is 29.99 kg/m.  General/Constitutional: No apparent distress: well-nourished and well developed. Eyes: Pupils equal, round with synchronous movement. Lymphatic: No palpable adenopathy. Respiratory: Patient has good chest rise and fall with inspiration and expiration. All lung fields are clear to auscultation bilaterally. There is no Rales, rhonchi or wheezes appreciated. Cardiovascular: Upon auscultation there is a regular rate and rhythm without any murmurs, rubs, gallops or heaves appreciated. There does not appear to be any swelling down the lower extremities. Posterior tibial pulses appreciated bilaterally, 2+. Integumentary:  No impressive skin lesions present, except as noted in detailed exam. Neuro/Psych: Normal mood and affect, oriented to person, place and time. Musculoskeletal: see  exam below  Left knee exam: Upon inspection of the patient's left knee there does not appear to be any skin changes, open abrasions, swelling or redness. There is a varus alignment. Upon palpation, the patient reports having pain along the medial aspect of their knee. Patient has full extension actively with ROM, and able to flex back to greater than 110 degrees with mild pain. Varus and valgus stress testing shows positive pseudolaxity to valgus stressing. The patella tracks well within the femoral groove from flexion into extension with mild crepitation. Anterior and posterior drawer testing negative. Patient is neurovascularly intact down their lower extremity to all dermatomes. Posterior tibial pulses appreciated 2+.  Imaging:  Previous films from 04/04/2022 were reviewed of the patient's left knee. There is noticeable joint space closure along the medial margin with subchondral changes and osteophyte formation present. There is bone-on-bone articulation noted along this medial cartilage space. No fractures, lytic lesions or gross deformities appreciated on films.  Impression: 1. Primary osteoarthritis of left knee.  Plan:  1. Treatment options were discussed today with the patient. 2. The patient is scheduled undergo a left total knee arthroplasty with Dr. Joice Lofts on 02/09/24. 3. The patient was instructed on the risk and benefits of surgical intervention and wishes to proceed at this time. 4. This document will serve as a surgical history and physical for the patient. 5. The patient will follow-up per standard postop protocol. They can call the clinic they have any questions, new symptoms develop or symptoms worsen.  The procedure was discussed with the patient, as were the potential risks (including bleeding, infection, nerve and/or blood vessel injury, persistent or recurrent pain, failure of the hardware, stiffness, loosening of and/or failure of the components, need for further surgery, blood  clots, strokes, heart attacks and/or arhythmias, pneumonia, etc.) and benefits. The patient states his understanding and wishes to proceed.     H&P reviewed and patient re-examined. No changes.

## 2024-02-09 NOTE — Discharge Instructions (Addendum)
 Orthopedic discharge instructions: May shower with intact OpSite dressing. Apply ice frequently to knee or use Polar Care. Start Eliquis 1 tablet (2.5 mg) twice daily on Wednesday, 02/10/2024, for 2 weeks, then take aspirin 325 mg twice daily (or 4 baby aspirin twice daily) for 4 weeks. Take pain medication as prescribed when needed.  May supplement with ES Tylenol if necessary. May weight-bear as tolerated on left leg - use walker for balance and support. Follow-up in 10-14 days or as scheduled.  [facility] .  [address] , [place], [place], [address]. [phone] They will call you to arrange when they can come to see you

## 2024-02-09 NOTE — Plan of Care (Signed)

## 2024-02-09 NOTE — Evaluation (Signed)
 Physical Therapy Evaluation Patient Details Name: Ralph Marquez MRN: [id number] DOB: 04-20-1956 Today's Date: 02/09/2024  History of Present Illness  Pt is a 67 y.o. male s/p L TKA d/t DJD L knee 02/09/24.  PMH includes h/o brain aneurysm, R TKR, htn.  Clinical Impression  Prior to surgery, pt reports being independent with functional mobility (limited d/t L knee impairments); lives with his wife on main level of home with 5-6 STE R railing; pt's friend is going to stay with pt and his wife (who just had surgery) to assist as needed.  0/10 pain at rest beginning of session and 4/10 L knee pain end of session at rest (nurse notified of pt's request for pain medication).  L knee AROM 0-100 degrees.  Currently pt is SBA with bed mobility; CGA with transfers; and CGA to ambulate in hallway with RW use (pt requesting to walk in hallway).  Pt would currently benefit from skilled PT to address noted impairments and functional limitations (see below for any additional details).  Upon hospital discharge, pt would benefit from ongoing therapy.     If plan is discharge home, recommend the following: A little help with walking and/or transfers;A little help with bathing/dressing/bathroom;Assistance with cooking/housework;Assist for transportation;Help with stairs or ramp for entrance   Can travel by private vehicle    Yes    Equipment Recommendations Rolling walker (2 wheels)  Recommendations for Other Services       Functional Status Assessment Patient has had a recent decline in their functional status and demonstrates the ability to make significant improvements in function in a reasonable and predictable amount of time.     Precautions / Restrictions Precautions Precautions: Knee;Fall Precaution Booklet Issued: Yes (comment) Recall of Precautions/Restrictions: Intact Restrictions Weight Bearing Restrictions Per Provider Order: Yes LLE Weight Bearing Per Provider Order: Weight bearing as tolerated       Mobility  Bed Mobility Overal bed mobility: Needs Assistance Bed Mobility: Supine to Sit, Sit to Supine     Supine to sit: Supervision, HOB elevated Sit to supine: Supervision, HOB elevated   General bed mobility comments: mild increased effort to perform on own    Transfers Overall transfer level: Needs assistance Equipment used: Rolling walker (2 wheels) Transfers: Sit to/from Stand Sit to Stand: Contact guard assist           General transfer comment: x1 trial from bed (mildly elevated) and x1 trial from [facility] over toilet; vc's for UE/LE positioning and overall technique    Ambulation/Gait Ambulation/Gait assistance: Contact guard assist Gait Distance (Feet):  (15 feet (to bathroom); approximately 150 feet) Assistive device: Rolling walker (2 wheels)   Gait velocity: decreased     General Gait Details: antalgic; decreased stance time L LE; partial step through gait pattern; steady with RW use  Stairs            Wheelchair Mobility     Tilt Bed    Modified Rankin (Stroke Patients Only)       Balance Overall balance assessment: Needs assistance Sitting-balance support: No upper extremity supported, Feet supported Sitting balance-Leahy Scale: Good Sitting balance - Comments: steady reaching within BOS   Standing balance support: Bilateral upper extremity supported, During functional activity, Reliant on assistive device for balance Standing balance-Leahy Scale: Good Standing balance comment: steady ambulating with RW use  Pertinent Vitals/Pain Pain Assessment Pain Assessment: 0-10 Pain Score: 4  Pain Location: L knee Pain Descriptors / Indicators: Sore, Tender Pain Intervention(s): Limited activity within patient's tolerance, Monitored during session, Repositioned, Patient requesting pain meds-RN notified, Other (comment) (polar care applied) Vitals (HR and SpO2 on room air) stable and WFL throughout  treatment session.    Home Living Family/patient expects to be discharged to:: Private residence Living Arrangements: Spouse/significant other Available Help at Discharge: Friend(s) Type of Home: House Home Access: Stairs to enter Entrance Stairs-Rails: Right Entrance Stairs-Number of Steps: 5-6   Home Layout: Two level;Able to live on main level with bedroom/bathroom Home Equipment: Rollator (4 wheels);Standard Walker;Cane - single point;BSC/3in1 Additional Comments: Pt's friend [name]) is staying with pt and pt's wife to assist as needed after surgery.    Prior Function Prior Level of Function : Independent/Modified Independent             Mobility Comments: Independent with ambulation; no recent falls reported. ADLs Comments: Independent     Extremity/Trunk Assessment   Upper Extremity Assessment Upper Extremity Assessment: Overall WFL for tasks assessed    Lower Extremity Assessment Lower Extremity Assessment: LLE deficits/detail (R LE WFL (h/o TKR)) LLE Deficits / Details: able to perform L LE SLR independently; at least 3/5 AROM hip flexion, knee flexion/extension, and DF    Cervical / Trunk Assessment Cervical / Trunk Assessment: Normal  Communication   Communication Communication: No apparent difficulties    Cognition Arousal: Alert Behavior During Therapy: WFL for tasks assessed/performed   PT - Cognitive impairments: No apparent impairments                         Following commands: Intact       Cueing Cueing Techniques: Verbal cues     General Comments General comments (skin integrity, edema, etc.): L knee dressing in place.  Nursing cleared pt for participation in physical therapy.  Pt agreeable to PT session.    Exercises Total Joint Exercises Ankle Circles/Pumps: AROM, Strengthening, Both, 10 reps, Supine Quad Sets: AROM, Strengthening, Both, 10 reps, Supine Heel Slides: AAROM, Strengthening, Left, 10 reps, Supine Hip  ABduction/ADduction: AAROM, Strengthening, Left, 10 reps, Supine Straight Leg Raises: AAROM, Strengthening, Left, 10 reps, Supine Goniometric ROM: L knee AROM 0-100 degrees   Assessment/Plan    PT Assessment Patient needs continued PT services  PT Problem List Decreased strength;Decreased range of motion;Decreased activity tolerance;Decreased balance;Decreased mobility;Decreased knowledge of use of DME;Decreased knowledge of precautions;Decreased skin integrity;Pain       PT Treatment Interventions DME instruction;Gait training;Stair training;Functional mobility training;Therapeutic activities;Therapeutic exercise;Balance training;Patient/family education    PT Goals (Current goals can be found in the Care Plan section)  Acute Rehab PT Goals Patient Stated Goal: to improve pain and walking PT Goal Formulation: With patient Time For Goal Achievement: 02/23/24 Potential to Achieve Goals: Good    Frequency BID     Co-evaluation               AM-PAC PT "6 Clicks" Mobility  Outcome Measure Help needed turning from your back to your side while in a flat bed without using bedrails?: None Help needed moving from lying on your back to sitting on the side of a flat bed without using bedrails?: A Little Help needed moving to and from a bed to a chair (including a wheelchair)?: A Little Help needed standing up from a chair using your arms (e.g., wheelchair or bedside chair)?: A Little Help  needed to walk in hospital room?: A Little Help needed climbing 3-5 steps with a railing? : A Little 6 Click Score: 19    End of Session Equipment Utilized During Treatment: Gait belt Activity Tolerance: Patient tolerated treatment well Patient left: in bed;with call bell/phone within reach;with bed alarm set;with SCD's reapplied;Other (comment) (L heel floating via towel roll; polar care in place) Nurse Communication: Mobility status;Patient requests pain meds;Precautions;Weight bearing  status PT Visit Diagnosis: Other abnormalities of gait and mobility (R26.89);Muscle weakness (generalized) (M62.81);Pain Pain - Right/Left: Left Pain - part of body: Knee    Time: 5409-8119 PT Time Calculation (min) (ACUTE ONLY): 31 min   Charges:   PT Evaluation $PT Eval Low Complexity: 1 Low PT Treatments $Gait Training: 8-22 mins $Therapeutic Activity: 8-22 mins PT General Charges $$ ACUTE PT VISIT: 1 Visit        Hendricks Limes, PT 02/09/24, 5:54 PM

## 2024-02-09 NOTE — TOC Progression Note (Signed)
 Transition of Care [facility]) - Progression Note    Patient Details  Name: Ralph Marquez MRN: [id number] Date of Birth: [birth year]  Transition of Care [facility]) CM/SW Contact  Marlowe Sax, RN Phone Number: 02/09/2024, 1:17 PM  Clinical Narrative:      Patient is set up with [facility] services prior to admission by surgeons office  Adapt to deliver 3 in 1 and RW to the bedside      Expected Discharge Plan and Services         Expected Discharge Date: 02/09/24                                     Social Determinants of Health (SDOH) Interventions SDOH Screenings   Food Insecurity: Food Insecurity Present (11/30/2023)   Received from [facility]  Housing: Low Risk  (02/01/2024)   Received from [facility]  Transportation Needs: No Transportation Needs (11/30/2023)   Received from [facility]  Utilities: Not At Risk (11/30/2023)   Received from [facility]  Depression [phone]): Low Risk  (01/29/2024)  Financial Resource Strain: Medium Risk (11/30/2023)   Received from [facility]  Social Connections: Unknown (04/22/2022)   Received from [facility]  Tobacco Use: Medium Risk (02/09/2024)    Readmission Risk Interventions     No data to display

## 2024-02-09 NOTE — Anesthesia Procedure Notes (Deleted)
 Spinal  Patient location during procedure: OR Start time: 02/09/2024 10:35 AM End time: 02/09/2024 10:40 AM Reason for block: surgical anesthesia Staffing Performed: resident/CRNA  Performed by: Susy Manor, RN Authorized by: Corinda Gubler, MD   Preanesthetic Checklist Completed: patient identified, IV checked, site marked, risks and benefits discussed, surgical consent, monitors and equipment checked, pre-op evaluation and timeout performed Spinal Block Patient position: sitting Prep: ChloraPrep Patient monitoring: heart rate, cardiac monitor, continuous pulse ox and blood pressure Approach: midline Location: L2-3 Injection technique: single-shot Needle Needle type: Pencan  Needle gauge: 25 G Needle length: 10 cm Assessment Sensory level: T4

## 2024-02-10 ENCOUNTER — Encounter: Payer: Self-pay | Admitting: Surgery

## 2024-02-10 ENCOUNTER — Other Ambulatory Visit (HOSPITAL_COMMUNITY): Payer: Self-pay

## 2024-02-10 ENCOUNTER — Telehealth (HOSPITAL_COMMUNITY): Payer: Self-pay | Admitting: Pharmacy Technician

## 2024-02-10 DIAGNOSIS — Z8673 Personal history of transient ischemic attack (TIA), and cerebral infarction without residual deficits: Secondary | ICD-10-CM | POA: Diagnosis not present

## 2024-02-10 DIAGNOSIS — Z79899 Other long term (current) drug therapy: Secondary | ICD-10-CM | POA: Diagnosis not present

## 2024-02-10 DIAGNOSIS — E782 Mixed hyperlipidemia: Secondary | ICD-10-CM | POA: Diagnosis not present

## 2024-02-10 DIAGNOSIS — M1712 Unilateral primary osteoarthritis, left knee: Secondary | ICD-10-CM | POA: Diagnosis not present

## 2024-02-10 DIAGNOSIS — K219 Gastro-esophageal reflux disease without esophagitis: Secondary | ICD-10-CM | POA: Diagnosis not present

## 2024-02-10 DIAGNOSIS — Z87891 Personal history of nicotine dependence: Secondary | ICD-10-CM | POA: Diagnosis not present

## 2024-02-10 DIAGNOSIS — Z7982 Long term (current) use of aspirin: Secondary | ICD-10-CM | POA: Diagnosis not present

## 2024-02-10 DIAGNOSIS — Z7901 Long term (current) use of anticoagulants: Secondary | ICD-10-CM | POA: Diagnosis not present

## 2024-02-10 DIAGNOSIS — Z791 Long term (current) use of non-steroidal anti-inflammatories (NSAID): Secondary | ICD-10-CM | POA: Diagnosis not present

## 2024-02-10 DIAGNOSIS — I10 Essential (primary) hypertension: Secondary | ICD-10-CM | POA: Diagnosis not present

## 2024-02-10 LAB — TOXASSURE SELECT 13 (MW), URINE

## 2024-02-10 MED ORDER — KETOROLAC TROMETHAMINE 15 MG/ML IJ SOLN
INTRAMUSCULAR | Status: AC
  Filled 2024-02-10: qty 1

## 2024-02-10 MED ORDER — OXYCODONE HCL 5 MG PO TABS: 5.0000 mg | ORAL_TABLET | ORAL | 0 refills | Status: DC | PRN

## 2024-02-10 MED ORDER — DILTIAZEM HCL ER COATED BEADS 180 MG PO CP24
ORAL_CAPSULE | ORAL | Status: AC
  Filled 2024-02-10: qty 1

## 2024-02-10 MED ORDER — ONDANSETRON HCL 4 MG PO TABS
4.0000 mg | ORAL_TABLET | Freq: Four times a day (QID) | ORAL | 0 refills | Status: DC | PRN
Start: 2024-02-10 — End: 2024-08-01

## 2024-02-10 NOTE — Progress Notes (Signed)
 Discharge Summary for Ralph Marquez January  Discharge Plan: Patient will be discharged home as per the MD's order. We discussed prescriptions and follow-up appointments with the patient. The prescriptions were provided, and the medication list was explained in detail. Patient confirmed understanding of the instructions.  Skin Assessment: The patient's skin is clean, dry, and intact, with no signs of breakdown or tears. The IV catheter was removed, and the skin remains intact. The site shows no signs or symptoms of complications. A dressing and pressure were applied to the site. Patient reports no pain and has no complaints.  After-Visit Summary: An After-Visit Summary was printed and given to the patient. The following items were sent home with patient:  3-in-1 bedside commode Front wheel rolling walker Polar care One honeycomb bandage Two TED hose placed on both patient legs  The patient was escorted via wheelchair and discharged home in a private vehicle.  Duwayne Matters D. Lawerance Bach, RN

## 2024-02-10 NOTE — TOC Initial Note (Signed)
 Transition of Care [facility]) - Initial/Assessment Note    Patient Details  Name: Ralph Marquez MRN: [id number] Date of Birth: 1956-07-11  Transition of [facility]) CM/SW Contact:    Marlowe Sax, RN Phone Number: 02/10/2024, 12:46 PM  Clinical Narrative:                 The patient is set up with [facility] prior to surgery by surgeons office, he has DME at home 4 wheels);Standard Walker;Cane - single point;BSC/3in1 Additional Comments: Pt's friend [name]) is staying with pt and pt's wife to assist as needed after surgery.  Expected Discharge Plan: Home w Home Health Services Barriers to Discharge: Barriers Resolved   Patient Goals and CMS Choice            Expected Discharge Plan and Services   Discharge Planning Services: CM Consult   Living arrangements for the past 2 months: Single Family Home Expected Discharge Date: 02/10/24               DME Arranged: N/A DME Agency: NA       HH Arranged: PT, OT HH Agency: [facility] Date HH Agency Contacted: 02/10/24 Time HH Agency Contacted: 1245 Representative spoke with at [facility] Agency: [place]  Prior Living Arrangements/Services Living arrangements for the past 2 months: Single Family Home Lives with:: Spouse Patient language and need for interpreter reviewed:: Yes Do you feel safe going back to the place where you live?: Yes      Need for Family Participation in Patient Care: Yes (Comment) Care giver support system in place?: Yes (comment) Current home services: DME (4 wheels);Standard Walker;Cane - single point;BSC/3in1  Additional Comments: Pt's friend [name]) is staying with pt and pt's wife to assist as needed after surgery.) Criminal Activity/Legal Involvement Pertinent to Current Situation/Hospitalization: No - Comment as needed  Activities of Daily Living   ADL Screening (condition at time of admission) Independently performs ADLs?: Yes (appropriate for developmental age) Is the patient deaf or have  difficulty hearing?: No Does the patient have difficulty seeing, even when wearing glasses/contacts?: No Does the patient have difficulty concentrating, remembering, or making decisions?: No  Permission Sought/Granted   Permission granted to share information with : Yes, Verbal Permission Granted              Emotional Assessment Appearance:: Appears stated age Attitude/Demeanor/Rapport: Engaged Affect (typically observed): Pleasant Orientation: : Oriented to Self, Oriented to Place, Oriented to  Time, Oriented to Situation Alcohol / Substance Use: Not Applicable Psych Involvement: No (comment)  Admission diagnosis:  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 [place] [address] Phone: [phone] Fax: [phone]     Social Drivers of Health (SDOH) Social History: SDOH Screenings   Food Insecurity: No Food Insecurity (02/09/2024)  Recent Concern: Food Insecurity - Food Insecurity Present (11/30/2023)   Received from [facility]  Housing: Low Risk  (02/09/2024)  Transportation Needs: No Transportation Needs (  02/09/2024)  Utilities: Not At Risk (02/09/2024)  Depression (PHQ2-9): Low Risk  (01/29/2024)  Financial Resource Strain: Medium Risk (11/30/2023)   Received from [facility]  Social Connections: Moderately Isolated (02/09/2024)  Tobacco Use: Medium Risk (02/09/2024)    SDOH Interventions:     Readmission Risk Interventions     No data to display

## 2024-02-10 NOTE — Progress Notes (Signed)
  Subjective: 1 Day Post-Op Procedure(s) (LRB): TOTAL KNEE ARTHROPLASTY (Left) Patient reports pain as mild.   Patient is well, and has had no acute complaints or problems Plan is to go Home after hospital stay. Negative for chest pain and shortness of breath Fever: no Gastrointestinal:Negative for nausea and vomiting Reports he is passing gas without pain this AM.  Objective: Vital signs in last 24 hours: Temp:  [97.5 F (36.4 C)-98.9 F (37.2 C)] 98.4 F (36.9 C) (03/05 0303) Pulse Rate:  [59-89] 72 (03/05 0303) Resp:  [11-19] 16 (03/05 0303) BP: (113-174)/(68-97) 128/70 (03/05 0303) SpO2:  [93 %-99 %] 94 % (03/05 0303) Weight:  [94.3 kg] 94.3 kg (03/04 0903)  Intake/Output from previous day:  Intake/Output Summary (Last 24 hours) at 02/10/2024 0742 Last data filed at 02/10/2024 0540 Gross per 24 hour  Intake 500 ml  Output 810 ml  Net -310 ml    Intake/Output this shift: No intake/output data recorded.  Labs: No results for input(s): "HGB" in the last 72 hours. No results for input(s): "WBC", "RBC", "HCT", "PLT" in the last 72 hours. No results for input(s): "NA", "K", "CL", "CO2", "BUN", "CREATININE", "GLUCOSE", "CALCIUM" in the last 72 hours. No results for input(s): "LABPT", "INR" in the last 72 hours.   EXAM General - Patient is Alert, Appropriate, and Oriented Extremity - ABD soft Neurovascular intact Dorsiflexion/Plantar flexion intact Incision: scant drainage No cellulitis present Compartment soft Dressing/Incision - minimal bloody drainage noted to the left knee honeycomb dressing. Motor Function - intact, moving foot and toes well on exam.  Abdomen soft with intact bowel sounds this AM.  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   Weight as of this encounter: 94.3 kg. Advance diet Up with therapy D/C IV fluids when tolerating po intake.  Vitals reviewed this AM. Up with PT today. Continue to work on a BM. Plan for discharge home pending progress with PT.  DVT Prophylaxis - TED hose and Eliquis Weight-Bearing as tolerated to left leg  J. Horris Latino, PA-C [facility] Orthopa[facility] Surgery 02/10/2024, 7:42 AM

## 2024-02-10 NOTE — Telephone Encounter (Signed)
 Patient Product/process development scientist completed.    The patient is insured through [employer]. Patient has Medicare and is not eligible for a copay card, but may be able to apply for patient assistance or Medicare RX Payment Plan (Patient Must reach out to their plan, if eligible for payment plan), if available.    Ran test claim for Eliquis 2.5 mg and the current 30 day co-pay is $287.37 due to a deductible.  Will be $47.00 once deductible is met.   This test claim was processed through [facility]- copay amounts may vary at other pharmacies due to pharmacy/plan contracts, or as the patient moves through the different stages of their insurance plan.     Roland Earl, CPHT Pharmacy Technician III Certified Patient Advocate [facility] Patient Advocate Team Direct Number: [phone]  Fax: [phone]

## 2024-02-10 NOTE — Discharge Summary (Signed)
 Physician Discharge Summary  Patient ID: Ralph Marquez MRN: [id number] DOB/AGE: [birth year] 68 y.o.  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   Consultants (if any):   Discharged Condition: Improved  Hospital Course: Ralph Marquez is an 68 y.o. male who was admitted 02/09/2024 with a diagnosis of primary osteoarthritis of the left knee and went to the operating room on 02/09/2024 and underwent the above named procedures.    Surgeries: Procedure(s): TOTAL KNEE ARTHROPLASTY on 02/09/2024 Patient tolerated the surgery well. Taken to PACU where he was stabilized and then transferred to the post op recovery area.  Started on Eliquis 2.5mg  every 12 hours. Heels elevated on bed with rolled towels. No evidence of DVT. Negative Homan. Physical therapy started on day #0 for gait training and transfer.   Patient's IV was removed on POD1.  Implants: Left TKA using all-pressfit Zimmer Persona system with a #8 PCR femur, a(n) F-sized  tibial tray with a 12 mm medial congruent E-poly insert, and a 9 x 32 Gy mm all-poly 3-pegged domed patella.   He was given perioperative antibiotics:  Anti-infectives  (From admission, onward)    Start     Dose/Rate Route Frequency Ordered Stop   02/09/24 1630  ceFAZolin (ANCEF) IVPB 2g/100 mL premix        2 g 200 mL/hr over 30 Minutes Intravenous Every 6 hours 02/09/24 1257 02/09/24 2316   02/09/24 0900  ceFAZolin (ANCEF) IVPB 2g/100 mL premix        2 g 200 mL/hr over 30 Minutes Intravenous On call to O.R. 02/09/24 4098 02/09/24 1034     .  He was given sequential compression devices, early ambulation, and Eliquis for DVT prophylaxis.  He benefited maximally from the hospital stay and there were no complications.    Recent vital signs:  Vitals:   02/09/24 2302 02/10/24 0303  BP: 113/76 128/70  Pulse: 81 72  Resp: 15 16  Temp: 98.6 F (37 C) 98.4 F (36.9 C)  SpO2: 93% 94%    Recent laboratory studies:  Lab Results  Component Value Date   HGB 13.9 02/05/2024   HGB 14.1 02/05/2024   HGB 15.0 08/01/2018   Lab Results  Component Value Date   WBC 11.1 (H) 02/05/2024   PLT 338 02/05/2024   No results found for: "INR" Lab Results  Component Value Date   NA 141 02/05/2024   K 4.0 02/05/2024   CL 108 02/05/2024   CO2 25 02/05/2024   BUN 38 (H) 02/05/2024   CREATININE 1.50 (H) 02/05/2024   GLUCOSE 96 02/05/2024    Discharge Medications:   Allergies as of 02/10/2024       Reactions   Amlodipine Hives   Lisinopril Hives   Losartan Potassium Hives  Betadine [povidone Iodine] Rash        Medication List     STOP taking these medications    aspirin 81 MG chewable tablet       TAKE these medications    apixaban 2.5 MG Tabs tablet Commonly known as: Eliquis Take 1 tablet (2.5 mg total) by mouth 2 (two) times daily.   diltiazem 360 MG 24 hr capsule Commonly known as: CARDIZEM CD Take 1 capsule (360 mg total) by mouth daily.   ondansetron 4 MG tablet Commonly known as: ZOFRAN Take 1 tablet (4 mg total) by mouth every 6 (six) hours as needed for nausea.   oxyCODONE 5 MG immediate release tablet Commonly known  as: Roxicodone Take 1-2 tablets (5-10 mg total) by mouth every 4 (four) hours as needed for moderate pain (pain score 4-6) or severe pain (pain score 7-10).   pantoprazole 40 MG tablet Commonly known as: PROTONIX Take 1 tablet (40 mg total) by mouth 2 (two) times daily before a meal.   pravastatin 10 MG tablet Commonly known as: PRAVACHOL Take 1 tablet (10 mg total) by mouth daily.   spironolactone 25 MG tablet Commonly known as: ALDACTONE Take 0.5 tablets (12.5 mg total) by mouth daily.   triazolam 0.25 MG tablet Commonly known as: HALCION Take 0.25 mg by mouth at bedtime as needed for sleep.               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 EXAM: PORTABLE LEFT KNEE - 1-2 VIEW COMPARISON:  None Available. FINDINGS: Left knee arthroplasty in expected alignment. No periprosthetic lucency or fracture. Recent postsurgical change includes air and edema in the soft tissues and joint space. Anterior skin staples in place. IMPRESSION: Left knee arthroplasty without immediate postoperative complication. Left knee arthroplasty without immediate postoperative complication. Electronically Signed   By: Narda Rutherford M.D.   On: 02/09/2024 16:07   Disposition: Plan for discharge home home today pending progress with PT.   Follow-up Information     Anson [place], PA-C Follow up in 14 day(s).   Specialty: Physician Assistant Why: Mindi Slicker information: [address] [place]  [address] [phone]                Signed: Meriel Pica PA-C 02/10/2024, 7:46 AM

## 2024-02-10 NOTE — Plan of Care (Signed)
?  Problem: Clinical Measurements: ?Goal: Respiratory complications will improve ?Outcome: Progressing ?  ?Problem: Elimination: ?Goal: Will not experience complications related to urinary retention ?Outcome: Progressing ?  ?

## 2024-02-10 NOTE — Progress Notes (Signed)
 Physical Therapy Treatment Patient Details Name: Ralph Marquez MRN: [id number] DOB: 1956/11/04 Today's Date: 02/10/2024   History of Present Illness Pt is a 68 y.o. male s/p L TKA d/t DJD L knee 02/09/24.  PMH includes h/o brain aneurysm, R TKR, htn.    PT Comments  Pt is A and O x 4. Agreeable to session and cooperative throughout. He demonstrated safe abilities to exit bed, stand, and ambulate with RW. Performed ascending/descending stairs 2 x with +1 rail to simulate home entry. Pt is cleared from an acute PT standpoint for safe DC home with HHPT to follow.    If plan is discharge home, recommend the following: A little help with walking and/or transfers;A little help with bathing/dressing/bathroom;Assistance with cooking/housework;Assist for transportation;Help with stairs or ramp for entrance     Equipment Recommendations  None recommended by PT (pt has DME already)       Precautions / Restrictions Precautions Precautions: Knee;Fall Precaution Booklet Issued: Yes (comment) Recall of Precautions/Restrictions: Intact Restrictions Weight Bearing Restrictions Per Provider Order: Yes LLE Weight Bearing Per Provider Order: Weight bearing as tolerated     Mobility  Bed Mobility Overal bed mobility: Modified Independent   Transfers Overall transfer level: Modified independent Equipment used: Rolling walker (2 wheels) Transfers: Sit to/from Stand Sit to Stand: Modified independent (Device/Increase time)   Ambulation/Gait Ambulation/Gait assistance: Modified independent (Device/Increase time) Gait Distance (Feet): 200 Feet Assistive device: Rolling walker (2 wheels) Gait Pattern/deviations: Step-through pattern, Antalgic Gait velocity: decreased  General Gait Details: no LOB or safety concerns during ambulation   Stairs Stairs: Yes Stairs assistance: Supervision Stair Management: One rail Right, Sideways, Forwards Number of Stairs: 8 General stair comments: pt performed  ascending/descending 4 stair 2 x with BUE on +1 rail to simulate home entry    Balance Overall balance assessment: Modified Independent       Communication Communication Communication: No apparent difficulties  Cognition Arousal: Alert Behavior During Therapy: WFL for tasks assessed/performed   PT - Cognitive impairments: No apparent impairments    PT - Cognition Comments: A and O x 4 Following commands: Intact      Cueing Cueing Techniques: Verbal cues  Exercises Total Joint Exercises Goniometric ROM: L knee AROM 1-106        Pertinent Vitals/Pain Pain Assessment Pain Assessment: 0-10 Pain Score: 5  Pain Descriptors / Indicators: Sore, Tender Pain Intervention(s): Limited activity within patient's tolerance, Monitored during session, Premedicated before session, Repositioned, Ice applied           PT Goals (current goals can now be found in the care plan section) Acute Rehab PT Goals Patient Stated Goal: go home today Progress towards PT goals: Progressing toward goals    Frequency    BID       AM-PAC PT "6 Clicks" Mobility   Outcome Measure  Help needed turning from your back to your side while in a flat bed without using bedrails?: None Help needed moving from lying on your back to sitting on the side of a flat bed without using bedrails?: None Help needed moving to and from a bed to a chair (including a wheelchair)?: None Help needed standing up from a chair using your arms (e.g., wheelchair or bedside chair)?: A Little Help needed to walk in hospital room?: A Little Help needed climbing 3-5 steps with a railing? : A Little 6 Click Score: 21    End of Session   Activity Tolerance: Patient tolerated treatment well Patient left: in chair;with  call bell/phone within reach;with chair alarm set Nurse Communication: Mobility status;Patient requests pain meds;Precautions;Weight bearing status PT Visit Diagnosis: Other abnormalities of gait and mobility  (R26.89);Muscle weakness (generalized) (M62.81);Pain Pain - Right/Left: Left Pain - part of body: Knee     Time: 2440-1027 PT Time Calculation (min) (ACUTE ONLY): 29 min  Charges:    $Gait Training: 8-22 mins $Therapeutic Activity: 8-22 mins PT General Charges $$ ACUTE PT VISIT: 1 Visit                     Jetta Lout PTA 02/10/24, 9:12 AM

## 2024-02-10 NOTE — Plan of Care (Signed)
  Problem: Education: Goal: Knowledge of General Education information will improve Description: Including pain rating scale, medication(s)/side effects and non-pharmacologic comfort measures Outcome: Progressing   Problem: Health Behavior/Discharge Planning: Goal: Ability to manage health-related needs will improve Outcome: Progressing   Problem: Clinical Measurements: Goal: Will remain free from infection Outcome: Progressing   Problem: Activity: Goal: Risk for activity intolerance will decrease Outcome: Progressing   Problem: Nutrition: Goal: Adequate nutrition will be maintained Outcome: Progressing   Problem: Coping: Goal: Level of anxiety will decrease Outcome: Progressing   Problem: Pain Managment: Goal: General experience of comfort will improve and/or be controlled Outcome: Progressing   Problem: Safety: Goal: Ability to remain free from injury will improve Outcome: Progressing   Problem: Skin Integrity: Goal: Risk for impaired skin integrity will decrease Outcome: Progressing

## 2024-02-11 NOTE — Anesthesia Postprocedure Evaluation (Signed)
 Anesthesia Post Note  Patient: Ralph Marquez  Procedure(s) Performed: TOTAL KNEE ARTHROPLASTY (Left: Knee)  Patient location during evaluation: Nursing Unit Anesthesia Type: Spinal Level of consciousness: awake and alert and oriented Pain management: pain level controlled Vital Signs Assessment: post-procedure vital signs reviewed and stable Respiratory status: respiratory function stable Cardiovascular status: stable Postop Assessment: no headache, no backache, no apparent nausea or vomiting, patient able to bend at knees, able to ambulate and adequate PO intake Anesthetic complications: no   There were no known notable events for this encounter.   Last Vitals:  Vitals:   02/10/24 0303 02/10/24 0852  BP: 128/70 128/80  Pulse: 72 74  Resp: 16 19  Temp: 36.9 C 36.8 C  SpO2: 94% 94%    Last Pain:  Vitals:   02/10/24 1212  TempSrc:   PainSc: 5                  Zachary George

## 2024-02-12 DIAGNOSIS — Z96653 Presence of artificial knee joint, bilateral: Secondary | ICD-10-CM | POA: Diagnosis not present

## 2024-02-12 DIAGNOSIS — Z471 Aftercare following joint replacement surgery: Secondary | ICD-10-CM | POA: Diagnosis not present

## 2024-02-12 DIAGNOSIS — I671 Cerebral aneurysm, nonruptured: Secondary | ICD-10-CM | POA: Diagnosis not present

## 2024-02-12 DIAGNOSIS — E782 Mixed hyperlipidemia: Secondary | ICD-10-CM | POA: Diagnosis not present

## 2024-02-12 DIAGNOSIS — Z7901 Long term (current) use of anticoagulants: Secondary | ICD-10-CM | POA: Diagnosis not present

## 2024-02-12 DIAGNOSIS — K219 Gastro-esophageal reflux disease without esophagitis: Secondary | ICD-10-CM | POA: Diagnosis not present

## 2024-02-12 DIAGNOSIS — K5900 Constipation, unspecified: Secondary | ICD-10-CM | POA: Diagnosis not present

## 2024-02-12 DIAGNOSIS — Z8673 Personal history of transient ischemic attack (TIA), and cerebral infarction without residual deficits: Secondary | ICD-10-CM | POA: Diagnosis not present

## 2024-02-12 DIAGNOSIS — I10 Essential (primary) hypertension: Secondary | ICD-10-CM | POA: Diagnosis not present

## 2024-02-12 DIAGNOSIS — Z9842 Cataract extraction status, left eye: Secondary | ICD-10-CM | POA: Diagnosis not present

## 2024-02-14 DIAGNOSIS — K219 Gastro-esophageal reflux disease without esophagitis: Secondary | ICD-10-CM | POA: Diagnosis not present

## 2024-02-14 DIAGNOSIS — I10 Essential (primary) hypertension: Secondary | ICD-10-CM | POA: Diagnosis not present

## 2024-02-14 DIAGNOSIS — Z8673 Personal history of transient ischemic attack (TIA), and cerebral infarction without residual deficits: Secondary | ICD-10-CM | POA: Diagnosis not present

## 2024-02-14 DIAGNOSIS — Z9842 Cataract extraction status, left eye: Secondary | ICD-10-CM | POA: Diagnosis not present

## 2024-02-14 DIAGNOSIS — K5900 Constipation, unspecified: Secondary | ICD-10-CM | POA: Diagnosis not present

## 2024-02-14 DIAGNOSIS — Z471 Aftercare following joint replacement surgery: Secondary | ICD-10-CM | POA: Diagnosis not present

## 2024-02-14 DIAGNOSIS — Z7901 Long term (current) use of anticoagulants: Secondary | ICD-10-CM | POA: Diagnosis not present

## 2024-02-14 DIAGNOSIS — E782 Mixed hyperlipidemia: Secondary | ICD-10-CM | POA: Diagnosis not present

## 2024-02-14 DIAGNOSIS — Z96653 Presence of artificial knee joint, bilateral: Secondary | ICD-10-CM | POA: Diagnosis not present

## 2024-02-14 DIAGNOSIS — I671 Cerebral aneurysm, nonruptured: Secondary | ICD-10-CM | POA: Diagnosis not present

## 2024-02-15 NOTE — Telephone Encounter (Unsigned)
 Copied from CRM [id number]. Topic: Clinical - Lab/Test Results >> Feb 15, 2024 11:30 AM Carlatta H wrote: Reason for CRM: Please call patients wife about labs that were ordered//PH [phone]

## 2024-02-15 NOTE — Telephone Encounter (Signed)
 Spoke to wife

## 2024-02-16 ENCOUNTER — Telehealth: Payer: Self-pay | Admitting: Internal Medicine

## 2024-02-16 DIAGNOSIS — I10 Essential (primary) hypertension: Secondary | ICD-10-CM | POA: Diagnosis not present

## 2024-02-16 DIAGNOSIS — Z9842 Cataract extraction status, left eye: Secondary | ICD-10-CM | POA: Diagnosis not present

## 2024-02-16 DIAGNOSIS — E782 Mixed hyperlipidemia: Secondary | ICD-10-CM | POA: Diagnosis not present

## 2024-02-16 DIAGNOSIS — I671 Cerebral aneurysm, nonruptured: Secondary | ICD-10-CM | POA: Diagnosis not present

## 2024-02-16 DIAGNOSIS — K5900 Constipation, unspecified: Secondary | ICD-10-CM | POA: Diagnosis not present

## 2024-02-16 DIAGNOSIS — E559 Vitamin D deficiency, unspecified: Secondary | ICD-10-CM

## 2024-02-16 DIAGNOSIS — Z8673 Personal history of transient ischemic attack (TIA), and cerebral infarction without residual deficits: Secondary | ICD-10-CM | POA: Diagnosis not present

## 2024-02-16 DIAGNOSIS — Z7901 Long term (current) use of anticoagulants: Secondary | ICD-10-CM | POA: Diagnosis not present

## 2024-02-16 DIAGNOSIS — K219 Gastro-esophageal reflux disease without esophagitis: Secondary | ICD-10-CM | POA: Diagnosis not present

## 2024-02-16 DIAGNOSIS — Z471 Aftercare following joint replacement surgery: Secondary | ICD-10-CM | POA: Diagnosis not present

## 2024-02-16 DIAGNOSIS — Z96653 Presence of artificial knee joint, bilateral: Secondary | ICD-10-CM | POA: Diagnosis not present

## 2024-02-16 MED ORDER — VITAMIN D (ERGOCALCIFEROL) 1.25 MG (50000 UNIT) PO CAPS
50000.0000 [IU] | ORAL_CAPSULE | ORAL | 0 refills | Status: AC
Start: 2024-02-16 — End: 2024-05-04

## 2024-02-16 NOTE — Telephone Encounter (Signed)
 Copied from CRM [id number]. Topic: Clinical - Medication Question >> Feb 16, 2024  9:31 AM Clayton Bibles wrote: Reason for CRM: Dr. Durwin Nora said he would call in a prescription for Vitamin B or Vitamin B12.  They called [facility] and there is no order for medication.  He wants Dr. Durwin Nora to know he will complete labs in 2 weeks.   Please call Suren at [phone]. Thanks

## 2024-02-16 NOTE — Telephone Encounter (Signed)
 Patient advised.

## 2024-02-18 DIAGNOSIS — Z471 Aftercare following joint replacement surgery: Secondary | ICD-10-CM | POA: Diagnosis not present

## 2024-02-18 DIAGNOSIS — I10 Essential (primary) hypertension: Secondary | ICD-10-CM | POA: Diagnosis not present

## 2024-02-18 DIAGNOSIS — K5900 Constipation, unspecified: Secondary | ICD-10-CM | POA: Diagnosis not present

## 2024-02-18 DIAGNOSIS — I671 Cerebral aneurysm, nonruptured: Secondary | ICD-10-CM | POA: Diagnosis not present

## 2024-02-18 DIAGNOSIS — K219 Gastro-esophageal reflux disease without esophagitis: Secondary | ICD-10-CM | POA: Diagnosis not present

## 2024-02-18 DIAGNOSIS — Z96653 Presence of artificial knee joint, bilateral: Secondary | ICD-10-CM | POA: Diagnosis not present

## 2024-02-18 DIAGNOSIS — Z9842 Cataract extraction status, left eye: Secondary | ICD-10-CM | POA: Diagnosis not present

## 2024-02-18 DIAGNOSIS — Z7901 Long term (current) use of anticoagulants: Secondary | ICD-10-CM | POA: Diagnosis not present

## 2024-02-18 DIAGNOSIS — E782 Mixed hyperlipidemia: Secondary | ICD-10-CM | POA: Diagnosis not present

## 2024-02-18 DIAGNOSIS — Z8673 Personal history of transient ischemic attack (TIA), and cerebral infarction without residual deficits: Secondary | ICD-10-CM | POA: Diagnosis not present

## 2024-02-19 DIAGNOSIS — Z9842 Cataract extraction status, left eye: Secondary | ICD-10-CM | POA: Diagnosis not present

## 2024-02-19 DIAGNOSIS — Z471 Aftercare following joint replacement surgery: Secondary | ICD-10-CM | POA: Diagnosis not present

## 2024-02-19 DIAGNOSIS — E782 Mixed hyperlipidemia: Secondary | ICD-10-CM | POA: Diagnosis not present

## 2024-02-19 DIAGNOSIS — Z8673 Personal history of transient ischemic attack (TIA), and cerebral infarction without residual deficits: Secondary | ICD-10-CM | POA: Diagnosis not present

## 2024-02-19 DIAGNOSIS — K5900 Constipation, unspecified: Secondary | ICD-10-CM | POA: Diagnosis not present

## 2024-02-19 DIAGNOSIS — Z7901 Long term (current) use of anticoagulants: Secondary | ICD-10-CM | POA: Diagnosis not present

## 2024-02-19 DIAGNOSIS — I671 Cerebral aneurysm, nonruptured: Secondary | ICD-10-CM | POA: Diagnosis not present

## 2024-02-19 DIAGNOSIS — Z96653 Presence of artificial knee joint, bilateral: Secondary | ICD-10-CM | POA: Diagnosis not present

## 2024-02-19 DIAGNOSIS — I10 Essential (primary) hypertension: Secondary | ICD-10-CM | POA: Diagnosis not present

## 2024-02-19 DIAGNOSIS — K219 Gastro-esophageal reflux disease without esophagitis: Secondary | ICD-10-CM | POA: Diagnosis not present

## 2024-02-22 DIAGNOSIS — K5900 Constipation, unspecified: Secondary | ICD-10-CM | POA: Diagnosis not present

## 2024-02-22 DIAGNOSIS — K219 Gastro-esophageal reflux disease without esophagitis: Secondary | ICD-10-CM | POA: Diagnosis not present

## 2024-02-22 DIAGNOSIS — I671 Cerebral aneurysm, nonruptured: Secondary | ICD-10-CM | POA: Diagnosis not present

## 2024-02-22 DIAGNOSIS — Z7901 Long term (current) use of anticoagulants: Secondary | ICD-10-CM | POA: Diagnosis not present

## 2024-02-22 DIAGNOSIS — Z471 Aftercare following joint replacement surgery: Secondary | ICD-10-CM | POA: Diagnosis not present

## 2024-02-22 DIAGNOSIS — I10 Essential (primary) hypertension: Secondary | ICD-10-CM | POA: Diagnosis not present

## 2024-02-22 DIAGNOSIS — E782 Mixed hyperlipidemia: Secondary | ICD-10-CM | POA: Diagnosis not present

## 2024-02-22 DIAGNOSIS — Z96653 Presence of artificial knee joint, bilateral: Secondary | ICD-10-CM | POA: Diagnosis not present

## 2024-02-22 DIAGNOSIS — Z9842 Cataract extraction status, left eye: Secondary | ICD-10-CM | POA: Diagnosis not present

## 2024-02-22 DIAGNOSIS — Z8673 Personal history of transient ischemic attack (TIA), and cerebral infarction without residual deficits: Secondary | ICD-10-CM | POA: Diagnosis not present

## 2024-02-24 ENCOUNTER — Telehealth: Payer: Self-pay | Admitting: Pharmacy Technician

## 2024-02-24 ENCOUNTER — Other Ambulatory Visit (HOSPITAL_COMMUNITY): Payer: Self-pay

## 2024-02-24 ENCOUNTER — Other Ambulatory Visit: Payer: Self-pay | Admitting: Internal Medicine

## 2024-02-24 DIAGNOSIS — I10 Essential (primary) hypertension: Secondary | ICD-10-CM | POA: Diagnosis not present

## 2024-02-24 DIAGNOSIS — I671 Cerebral aneurysm, nonruptured: Secondary | ICD-10-CM | POA: Diagnosis not present

## 2024-02-24 DIAGNOSIS — Z96653 Presence of artificial knee joint, bilateral: Secondary | ICD-10-CM | POA: Diagnosis not present

## 2024-02-24 DIAGNOSIS — Z471 Aftercare following joint replacement surgery: Secondary | ICD-10-CM | POA: Diagnosis not present

## 2024-02-24 DIAGNOSIS — E782 Mixed hyperlipidemia: Secondary | ICD-10-CM | POA: Diagnosis not present

## 2024-02-24 DIAGNOSIS — Z9842 Cataract extraction status, left eye: Secondary | ICD-10-CM | POA: Diagnosis not present

## 2024-02-24 DIAGNOSIS — K219 Gastro-esophageal reflux disease without esophagitis: Secondary | ICD-10-CM | POA: Diagnosis not present

## 2024-02-24 DIAGNOSIS — K5900 Constipation, unspecified: Secondary | ICD-10-CM | POA: Diagnosis not present

## 2024-02-24 DIAGNOSIS — Z8673 Personal history of transient ischemic attack (TIA), and cerebral infarction without residual deficits: Secondary | ICD-10-CM | POA: Diagnosis not present

## 2024-02-24 DIAGNOSIS — Z7901 Long term (current) use of anticoagulants: Secondary | ICD-10-CM | POA: Diagnosis not present

## 2024-02-24 MED ORDER — TRIAZOLAM 0.25 MG PO TABS: 0.2500 mg | ORAL_TABLET | Freq: Every evening | ORAL | 0 refills | Status: DC | PRN

## 2024-02-24 NOTE — Telephone Encounter (Signed)
 Copied from CRM [id number]. Topic: Clinical - Medication Refill >> Feb 24, 2024 11:57 AM Marland Kitchen D wrote: Most Recent Primary Care Visit:  Provider: Christel Mormon E  Department: [facility]  Visit Type: NEW PATIENT  Date: 01/29/2024  Medication: triazolam (HALCION) 0.25 MG tablet  Has the patient contacted their pharmacy? No (Agent: If no, request that the patient contact the pharmacy for the refill. If patient does not wish to contact the pharmacy document the reason why and proceed with request.) (Agent: If yes, when and what did the pharmacy advise?)  Is this the correct pharmacy for this prescription? Yes If no, delete pharmacy and type the correct one.  This is the patient's preferred pharmacy:  [facility] [address] [phone] [place]    Has the prescription been filled recently? No  Is the patient out of the medication? No but almost  Has the patient been seen for an appointment in the last year OR does the patient have an upcoming appointment? Yes  Can we respond through MyChart? No  Agent: Please be advised that Rx refills may take up to 3 business days. We ask that you follow-up with your pharmacy.

## 2024-02-24 NOTE — Telephone Encounter (Signed)
 Pharmacy Patient Advocate Encounter   Received notification from CoverMyMeds that prior authorization for TRIAZOLAM 0.25MG  TABLETS is required/requested.   Insurance verification completed.   The patient is insured through [employer] .   Per test claim: PA required; PA submitted to above mentioned insurance via CoverMyMeds Key/confirmation #/EOC [facility] Status is pending

## 2024-02-25 ENCOUNTER — Other Ambulatory Visit (HOSPITAL_COMMUNITY): Payer: Self-pay

## 2024-02-25 NOTE — Telephone Encounter (Signed)
 Pharmacy Patient Advocate Encounter  Received notification from [facility] that Prior Authorization for TRIAZOLAM 0.5MG  CAPSULES has been APPROVED from 02/24/2024 to 12/07/2024. Ran test claim, Copay is $21.30. This test claim was processed through [facility]- copay amounts may vary at other pharmacies due to pharmacy/plan contracts, or as the patient moves through the different stages of their insurance plan.   PA #/Case ID/Reference #: [id number]

## 2024-02-26 DIAGNOSIS — Z96652 Presence of left artificial knee joint: Secondary | ICD-10-CM | POA: Diagnosis not present

## 2024-02-26 DIAGNOSIS — M1712 Unilateral primary osteoarthritis, left knee: Secondary | ICD-10-CM | POA: Diagnosis not present

## 2024-02-29 DIAGNOSIS — R944 Abnormal results of kidney function studies: Secondary | ICD-10-CM | POA: Diagnosis not present

## 2024-03-01 ENCOUNTER — Encounter: Payer: Self-pay | Admitting: Internal Medicine

## 2024-03-01 LAB — BASIC METABOLIC PANEL
BUN/Creatinine Ratio: 22 (ref 10–24)
BUN: 30 mg/dL — ABNORMAL HIGH (ref 8–27)
CO2: 22 mmol/L (ref 20–29)
Calcium: 9.3 mg/dL (ref 8.6–10.2)
Chloride: 102 mmol/L (ref 96–106)
Creatinine, Ser: 1.35 mg/dL — ABNORMAL HIGH (ref 0.76–1.27)
Glucose: 125 mg/dL — ABNORMAL HIGH (ref 70–99)
Potassium: 4.5 mmol/L (ref 3.5–5.2)
Sodium: 139 mmol/L (ref 134–144)
eGFR: 58 mL/min/{1.73_m2} — ABNORMAL LOW (ref >59)

## 2024-03-02 DIAGNOSIS — M1712 Unilateral primary osteoarthritis, left knee: Secondary | ICD-10-CM | POA: Diagnosis not present

## 2024-03-02 DIAGNOSIS — Z96652 Presence of left artificial knee joint: Secondary | ICD-10-CM | POA: Diagnosis not present

## 2024-03-04 DIAGNOSIS — M1712 Unilateral primary osteoarthritis, left knee: Secondary | ICD-10-CM | POA: Diagnosis not present

## 2024-03-04 DIAGNOSIS — Z96652 Presence of left artificial knee joint: Secondary | ICD-10-CM | POA: Diagnosis not present

## 2024-03-07 DIAGNOSIS — Z96652 Presence of left artificial knee joint: Secondary | ICD-10-CM | POA: Diagnosis not present

## 2024-03-07 DIAGNOSIS — M1712 Unilateral primary osteoarthritis, left knee: Secondary | ICD-10-CM | POA: Diagnosis not present

## 2024-03-14 DIAGNOSIS — Z96652 Presence of left artificial knee joint: Secondary | ICD-10-CM | POA: Diagnosis not present

## 2024-03-14 DIAGNOSIS — M1712 Unilateral primary osteoarthritis, left knee: Secondary | ICD-10-CM | POA: Diagnosis not present

## 2024-03-16 DIAGNOSIS — M1712 Unilateral primary osteoarthritis, left knee: Secondary | ICD-10-CM | POA: Diagnosis not present

## 2024-03-16 DIAGNOSIS — Z96652 Presence of left artificial knee joint: Secondary | ICD-10-CM | POA: Diagnosis not present

## 2024-03-21 DIAGNOSIS — Z96652 Presence of left artificial knee joint: Secondary | ICD-10-CM | POA: Diagnosis not present

## 2024-03-22 DIAGNOSIS — R2689 Other abnormalities of gait and mobility: Secondary | ICD-10-CM | POA: Diagnosis not present

## 2024-03-22 DIAGNOSIS — M62562 Muscle wasting and atrophy, not elsewhere classified, left lower leg: Secondary | ICD-10-CM | POA: Diagnosis not present

## 2024-03-22 DIAGNOSIS — M25662 Stiffness of left knee, not elsewhere classified: Secondary | ICD-10-CM | POA: Diagnosis not present

## 2024-03-22 DIAGNOSIS — M25562 Pain in left knee: Secondary | ICD-10-CM | POA: Diagnosis not present

## 2024-03-28 ENCOUNTER — Other Ambulatory Visit: Payer: Self-pay | Admitting: Internal Medicine

## 2024-03-28 DIAGNOSIS — M62562 Muscle wasting and atrophy, not elsewhere classified, left lower leg: Secondary | ICD-10-CM | POA: Diagnosis not present

## 2024-03-28 DIAGNOSIS — M25562 Pain in left knee: Secondary | ICD-10-CM | POA: Diagnosis not present

## 2024-03-28 DIAGNOSIS — M25662 Stiffness of left knee, not elsewhere classified: Secondary | ICD-10-CM | POA: Diagnosis not present

## 2024-03-28 DIAGNOSIS — R2689 Other abnormalities of gait and mobility: Secondary | ICD-10-CM | POA: Diagnosis not present

## 2024-04-04 DIAGNOSIS — R2689 Other abnormalities of gait and mobility: Secondary | ICD-10-CM | POA: Diagnosis not present

## 2024-04-04 DIAGNOSIS — M62562 Muscle wasting and atrophy, not elsewhere classified, left lower leg: Secondary | ICD-10-CM | POA: Diagnosis not present

## 2024-04-04 DIAGNOSIS — M25662 Stiffness of left knee, not elsewhere classified: Secondary | ICD-10-CM | POA: Diagnosis not present

## 2024-04-04 DIAGNOSIS — M25562 Pain in left knee: Secondary | ICD-10-CM | POA: Diagnosis not present

## 2024-04-08 DIAGNOSIS — M25562 Pain in left knee: Secondary | ICD-10-CM | POA: Diagnosis not present

## 2024-04-08 DIAGNOSIS — M62562 Muscle wasting and atrophy, not elsewhere classified, left lower leg: Secondary | ICD-10-CM | POA: Diagnosis not present

## 2024-04-08 DIAGNOSIS — M25662 Stiffness of left knee, not elsewhere classified: Secondary | ICD-10-CM | POA: Diagnosis not present

## 2024-04-08 DIAGNOSIS — R2689 Other abnormalities of gait and mobility: Secondary | ICD-10-CM | POA: Diagnosis not present

## 2024-04-11 DIAGNOSIS — R2689 Other abnormalities of gait and mobility: Secondary | ICD-10-CM | POA: Diagnosis not present

## 2024-04-11 DIAGNOSIS — M62562 Muscle wasting and atrophy, not elsewhere classified, left lower leg: Secondary | ICD-10-CM | POA: Diagnosis not present

## 2024-04-11 DIAGNOSIS — M25562 Pain in left knee: Secondary | ICD-10-CM | POA: Diagnosis not present

## 2024-04-11 DIAGNOSIS — M25662 Stiffness of left knee, not elsewhere classified: Secondary | ICD-10-CM | POA: Diagnosis not present

## 2024-04-14 DIAGNOSIS — M25662 Stiffness of left knee, not elsewhere classified: Secondary | ICD-10-CM | POA: Diagnosis not present

## 2024-04-14 DIAGNOSIS — M25562 Pain in left knee: Secondary | ICD-10-CM | POA: Diagnosis not present

## 2024-04-14 DIAGNOSIS — R2689 Other abnormalities of gait and mobility: Secondary | ICD-10-CM | POA: Diagnosis not present

## 2024-04-14 DIAGNOSIS — M62562 Muscle wasting and atrophy, not elsewhere classified, left lower leg: Secondary | ICD-10-CM | POA: Diagnosis not present

## 2024-04-19 DIAGNOSIS — M62562 Muscle wasting and atrophy, not elsewhere classified, left lower leg: Secondary | ICD-10-CM | POA: Diagnosis not present

## 2024-04-19 DIAGNOSIS — R2689 Other abnormalities of gait and mobility: Secondary | ICD-10-CM | POA: Diagnosis not present

## 2024-04-19 DIAGNOSIS — M25662 Stiffness of left knee, not elsewhere classified: Secondary | ICD-10-CM | POA: Diagnosis not present

## 2024-04-19 DIAGNOSIS — M25562 Pain in left knee: Secondary | ICD-10-CM | POA: Diagnosis not present

## 2024-04-22 DIAGNOSIS — R2689 Other abnormalities of gait and mobility: Secondary | ICD-10-CM | POA: Diagnosis not present

## 2024-04-22 DIAGNOSIS — M25662 Stiffness of left knee, not elsewhere classified: Secondary | ICD-10-CM | POA: Diagnosis not present

## 2024-04-22 DIAGNOSIS — M25562 Pain in left knee: Secondary | ICD-10-CM | POA: Diagnosis not present

## 2024-04-22 DIAGNOSIS — M62562 Muscle wasting and atrophy, not elsewhere classified, left lower leg: Secondary | ICD-10-CM | POA: Diagnosis not present

## 2024-04-25 ENCOUNTER — Other Ambulatory Visit: Payer: Self-pay | Admitting: Internal Medicine

## 2024-04-26 DIAGNOSIS — M25562 Pain in left knee: Secondary | ICD-10-CM | POA: Diagnosis not present

## 2024-04-26 DIAGNOSIS — M25662 Stiffness of left knee, not elsewhere classified: Secondary | ICD-10-CM | POA: Diagnosis not present

## 2024-04-26 DIAGNOSIS — M62562 Muscle wasting and atrophy, not elsewhere classified, left lower leg: Secondary | ICD-10-CM | POA: Diagnosis not present

## 2024-04-26 DIAGNOSIS — R2689 Other abnormalities of gait and mobility: Secondary | ICD-10-CM | POA: Diagnosis not present

## 2024-04-29 DIAGNOSIS — M25562 Pain in left knee: Secondary | ICD-10-CM | POA: Diagnosis not present

## 2024-04-29 DIAGNOSIS — M25662 Stiffness of left knee, not elsewhere classified: Secondary | ICD-10-CM | POA: Diagnosis not present

## 2024-04-29 DIAGNOSIS — R2689 Other abnormalities of gait and mobility: Secondary | ICD-10-CM | POA: Diagnosis not present

## 2024-04-29 DIAGNOSIS — M62562 Muscle wasting and atrophy, not elsewhere classified, left lower leg: Secondary | ICD-10-CM | POA: Diagnosis not present

## 2024-05-03 DIAGNOSIS — M62562 Muscle wasting and atrophy, not elsewhere classified, left lower leg: Secondary | ICD-10-CM | POA: Diagnosis not present

## 2024-05-03 DIAGNOSIS — R2689 Other abnormalities of gait and mobility: Secondary | ICD-10-CM | POA: Diagnosis not present

## 2024-05-03 DIAGNOSIS — M25662 Stiffness of left knee, not elsewhere classified: Secondary | ICD-10-CM | POA: Diagnosis not present

## 2024-05-03 DIAGNOSIS — M25562 Pain in left knee: Secondary | ICD-10-CM | POA: Diagnosis not present

## 2024-05-10 DIAGNOSIS — M25662 Stiffness of left knee, not elsewhere classified: Secondary | ICD-10-CM | POA: Diagnosis not present

## 2024-05-10 DIAGNOSIS — R2689 Other abnormalities of gait and mobility: Secondary | ICD-10-CM | POA: Diagnosis not present

## 2024-05-10 DIAGNOSIS — M25562 Pain in left knee: Secondary | ICD-10-CM | POA: Diagnosis not present

## 2024-05-10 DIAGNOSIS — M62562 Muscle wasting and atrophy, not elsewhere classified, left lower leg: Secondary | ICD-10-CM | POA: Diagnosis not present

## 2024-05-13 ENCOUNTER — Encounter: Payer: Self-pay | Admitting: Podiatry

## 2024-05-13 ENCOUNTER — Telehealth: Payer: Self-pay

## 2024-05-13 ENCOUNTER — Ambulatory Visit: Admitting: Internal Medicine

## 2024-05-13 ENCOUNTER — Ambulatory Visit: Admitting: Podiatry

## 2024-05-13 DIAGNOSIS — M25571 Pain in right ankle and joints of right foot: Secondary | ICD-10-CM

## 2024-05-13 DIAGNOSIS — I10 Essential (primary) hypertension: Secondary | ICD-10-CM

## 2024-05-13 MED ORDER — TRIAMCINOLONE ACETONIDE 10 MG/ML IJ SUSP
10.0000 mg | Freq: Once | INTRAMUSCULAR | Status: AC
  Administered 2024-05-13: 10 mg

## 2024-05-13 NOTE — Telephone Encounter (Signed)
 Copied from CRM [id number]. Topic: Appointments - Scheduling Inquiry for Clinic >> May 13, 2024 11:14 AM Baldemar Lev wrote: Reason for CRM: Pt called wanting to know if he will need lab work during upcoming visit, please advise so he can plan accordingly  Best contact: [phone]

## 2024-05-13 NOTE — Progress Notes (Signed)
 Patient presents with complaint of pain on the right foot on the dorsal aspect.  Has had problems with the joints in the past.  And last had an injection last winter.  Has not noticed any redness and does not recall any injury to the area.   Physical exam:  General appearance: Pleasant, and in no acute distress. AOx3.  Vascular: Pedal pulses: DP 2/4 bilaterally, PT 2/4 bilaterally.  Moderate edema lower legs bilaterally. Capillary fill time immediate.  Neurological: Light touch intact feet bilaterally.  Normal Achilles reflex bilaterally.  No clonus or spasticity noted.   Dermatologic:   Skin normal temperature bilaterally.  Skin normal color, tone, and texture bilaterally.   Musculoskeletal: There is over the second tarsal metatarsal joint right foot.  Tenderness with range of motion of the second TMT.  No crepitation noted.  Some osteophytic changes palpable dorsally.  Radiographs: Today  Diagnosis: 1.  Arthralgia tarsal metatarsal joint second left foot  Plan: -Recommend injection today to calm the joint down.  He recently had a knee surgery and is probably altering his walk somewhat and stressing the joint. -Injected 3cc 2:1 mixture 0.5 cc Marcaine :Kenolog 10mg /21ml at second tarsometatarsal joint right.    Return 2 weeks follow-up injection right

## 2024-05-17 DIAGNOSIS — M25662 Stiffness of left knee, not elsewhere classified: Secondary | ICD-10-CM | POA: Diagnosis not present

## 2024-05-17 DIAGNOSIS — M62562 Muscle wasting and atrophy, not elsewhere classified, left lower leg: Secondary | ICD-10-CM | POA: Diagnosis not present

## 2024-05-17 DIAGNOSIS — M25562 Pain in left knee: Secondary | ICD-10-CM | POA: Diagnosis not present

## 2024-05-17 DIAGNOSIS — R2689 Other abnormalities of gait and mobility: Secondary | ICD-10-CM | POA: Diagnosis not present

## 2024-05-20 ENCOUNTER — Ambulatory Visit (INDEPENDENT_AMBULATORY_CARE_PROVIDER_SITE_OTHER): Admitting: Internal Medicine

## 2024-05-20 ENCOUNTER — Encounter: Payer: Self-pay | Admitting: Internal Medicine

## 2024-05-20 VITALS — BP 131/89 | HR 91 | Ht 70.0 in | Wt 216.8 lb

## 2024-05-20 DIAGNOSIS — N1831 Chronic kidney disease, stage 3a: Secondary | ICD-10-CM

## 2024-05-20 DIAGNOSIS — I10 Essential (primary) hypertension: Secondary | ICD-10-CM | POA: Diagnosis not present

## 2024-05-20 DIAGNOSIS — K219 Gastro-esophageal reflux disease without esophagitis: Secondary | ICD-10-CM | POA: Diagnosis not present

## 2024-05-20 DIAGNOSIS — E559 Vitamin D deficiency, unspecified: Secondary | ICD-10-CM

## 2024-05-20 DIAGNOSIS — M25562 Pain in left knee: Secondary | ICD-10-CM | POA: Diagnosis not present

## 2024-05-20 DIAGNOSIS — M62562 Muscle wasting and atrophy, not elsewhere classified, left lower leg: Secondary | ICD-10-CM | POA: Diagnosis not present

## 2024-05-20 DIAGNOSIS — E782 Mixed hyperlipidemia: Secondary | ICD-10-CM | POA: Diagnosis not present

## 2024-05-20 DIAGNOSIS — R2689 Other abnormalities of gait and mobility: Secondary | ICD-10-CM | POA: Diagnosis not present

## 2024-05-20 DIAGNOSIS — E1122 Type 2 diabetes mellitus with diabetic chronic kidney disease: Secondary | ICD-10-CM

## 2024-05-20 DIAGNOSIS — N189 Chronic kidney disease, unspecified: Secondary | ICD-10-CM | POA: Insufficient documentation

## 2024-05-20 DIAGNOSIS — M25662 Stiffness of left knee, not elsewhere classified: Secondary | ICD-10-CM | POA: Diagnosis not present

## 2024-05-20 MED ORDER — EMPAGLIFLOZIN 10 MG PO TABS: 10.0000 mg | ORAL_TABLET | Freq: Every day | ORAL | 2 refills | Status: DC

## 2024-05-20 MED ORDER — PRAVASTATIN SODIUM 20 MG PO TABS: 20.0000 mg | ORAL_TABLET | Freq: Every day | ORAL | 3 refills | Status: DC

## 2024-05-20 NOTE — Assessment & Plan Note (Signed)
Noted on previous labs.  He has completed high-dose, weekly vitamin D supplementation. -Repeat vitamin D level ordered today 

## 2024-05-20 NOTE — Assessment & Plan Note (Addendum)
 New diagnosis.  Meeting criteria with A1c 6.6 on labs in February.  We reviewed dietary changes in an effort to lower his blood sugar.  Will also add Jardiance 10 mg daily in the setting of diabetes mellitus and CKD with contraindications to ACE/ARB therapy.  Urine microalbumin/creatinine ratio ordered today.  He was recently seen by podiatry and plans to establish care with ophthalmology.

## 2024-05-20 NOTE — Assessment & Plan Note (Signed)
 Lipid panel updated in February.  Total cholesterol 134 and LDL 85.  Increase pravastatin  to 20 mg daily for improved LDL control in the setting of diabetes mellitus.

## 2024-05-20 NOTE — Assessment & Plan Note (Signed)
 Recent labs consistent with CKD 3A.  ACE/ARB contraindicated due to adverse side effects.  Through shared decision making, Jardiance 10 mg daily has been prescribed today.

## 2024-05-20 NOTE — Patient Instructions (Signed)
 It was a pleasure to see you today.  Thank you for giving us  the opportunity to be involved in your care.  Below is a brief recap of your visit and next steps.  We will plan to see you again in 3 months.   Summary Add jardiance for kidney protection and diabetes Increase pravastatin  to 20 mg daily Repeat vitamin D  level Check urine study Follow up in 3 months

## 2024-05-20 NOTE — Assessment & Plan Note (Signed)
 Remains adequately controlled on current antihypertensive regimen.

## 2024-05-20 NOTE — Progress Notes (Signed)
 Established Patient Office Visit  Subjective   Patient ID: Ralph Marquez, male    DOB: 04-14-1956  Age: 68 y.o. MRN: [id number]  Chief Complaint  Patient presents with   Hypertension    Three month follow up    [name] returns to care today for follow-up.  He was last evaluated by me on 2/21 as a new patient presenting to establish care.  No medication changes were made at that time, repeat labs ordered, and 21-month follow-up recommended for reassessment.  In the interim, he underwent left total knee arthroplasty on 3/4.  He has been seen by orthopedic surgery for follow-up on multiple occasions in the interim and has also been seen by podiatry for follow-up.  There have otherwise been no acute interval events. [name] reports feeling well today.  He is asymptomatic and has no acute concerns to discuss.  Past Medical History:  Diagnosis Date   Anterior communicating artery aneurysm 2019   GERD (gastroesophageal reflux disease)    Hypertension    Psychophysiologic insomnia 01/29/2024   Past Surgical History:  Procedure Laterality Date   aneurysm surgery Bilateral 2019   bone spur shoulder     ESOPHAGOGASTRODUODENOSCOPY N/A 07/30/2017   Procedure: ESOPHAGOGASTRODUODENOSCOPY (EGD) Possible esophageal dilation.;  Surgeon: Ruby Corporal, MD;  Location: [facility];  Service: Endoscopy;  Laterality: N/A;   REPLACEMENT TOTAL KNEE Right    TOTAL KNEE ARTHROPLASTY Left 02/09/2024   Procedure: TOTAL KNEE ARTHROPLASTY;  Surgeon: Elner Hahn, MD;  Location: [facility];  Service: Orthopedics;  Laterality: Left;   Social History   Tobacco Use   Smoking status: Former    Types: Cigarettes   Smokeless tobacco: Never   Tobacco comments:    40 yrs quit 3 yrs ago  Vaping Use   Vaping status: Never Used  Substance Use Topics   Alcohol use: No   Drug use: No   Family History  Problem Relation Age of Onset   CAD Mother        CABG in 62's   CVA Sister    Allergies  Allergen  Reactions   Amlodipine  Hives   Lisinopril Hives   Losartan Potassium Hives   Betadine [Povidone Iodine] Rash   Review of Systems  Constitutional:  Negative for chills and fever.  HENT:  Negative for sore throat.   Respiratory:  Negative for cough and shortness of breath.   Cardiovascular:  Negative for chest pain, palpitations and leg swelling.  Gastrointestinal:  Negative for abdominal pain, blood in stool, constipation, diarrhea, nausea and vomiting.  Genitourinary:  Negative for dysuria and hematuria.  Musculoskeletal:  Negative for myalgias.  Skin:  Negative for itching and rash.  Neurological:  Negative for dizziness and headaches.  Psychiatric/Behavioral:  Negative for depression and suicidal ideas.      Objective:     BP 131/89   Pulse 91   Ht 5' 10 (1.778 m)   Wt 216 lb 12.8 oz (98.3 kg)   SpO2 96%   BMI 31.11 kg/m  BP Readings from Last 3 Encounters:  05/20/24 131/89  02/10/24 128/80  02/05/24 (!) 147/93   Physical Exam Vitals reviewed.  Constitutional:      General: He is not in acute distress.    Appearance: Normal appearance. He is not ill-appearing.  HENT:     Head: Normocephalic and atraumatic.     Right Ear: External ear normal.     Left Ear: External ear normal.  Nose: Nose normal. No congestion or rhinorrhea.     Mouth/Throat:     Mouth: Mucous membranes are moist.     Pharynx: Oropharynx is clear.   Eyes:     General: No scleral icterus.    Extraocular Movements: Extraocular movements intact.     Conjunctiva/sclera: Conjunctivae normal.     Pupils: Pupils are equal, round, and reactive to light.    Cardiovascular:     Rate and Rhythm: Normal rate and regular rhythm.     Pulses: Normal pulses.     Heart sounds: Normal heart sounds. No murmur heard. Pulmonary:     Effort: Pulmonary effort is normal.     Breath sounds: Normal breath sounds. No wheezing, rhonchi or rales.  Abdominal:     General: Abdomen is flat. Bowel sounds are  normal. There is no distension.     Palpations: Abdomen is soft.     Tenderness: There is no abdominal tenderness.   Musculoskeletal:        General: No swelling or deformity. Normal range of motion.     Cervical back: Normal range of motion.   Skin:    General: Skin is warm and dry.     Capillary Refill: Capillary refill takes less than 2 seconds.   Neurological:     General: No focal deficit present.     Mental Status: He is alert and oriented to person, place, and time.     Motor: No weakness.     Gait: Gait (ambulates with a cane) normal.   Psychiatric:        Mood and Affect: Mood normal.        Behavior: Behavior normal.        Thought Content: Thought content normal.   Last CBC Lab Results  Component Value Date   WBC 11.1 (H) 02/05/2024   HGB 13.9 02/05/2024   HCT 41.9 02/05/2024   MCV 94.2 02/05/2024   MCH 31.2 02/05/2024   RDW 13.2 02/05/2024   PLT 338 02/05/2024   Last metabolic panel Lab Results  Component Value Date   GLUCOSE 125 (H) 02/29/2024   NA 139 02/29/2024   K 4.5 02/29/2024   CL 102 02/29/2024   CO2 22 02/29/2024   BUN 30 (H) 02/29/2024   CREATININE 1.35 (H) 02/29/2024   EGFR 58 (L) 02/29/2024   CALCIUM 9.3 02/29/2024   PROT 7.2 02/05/2024   ALBUMIN 3.6 02/05/2024   LABGLOB 2.4 02/05/2024   BILITOT 0.8 02/05/2024   ALKPHOS 64 02/05/2024   AST 15 02/05/2024   ALT 20 02/05/2024   ANIONGAP 8 02/05/2024   Last lipids Lab Results  Component Value Date   CHOL 134 02/05/2024   HDL 33 (L) 02/05/2024   LDLCALC 85 02/05/2024   TRIG 79 02/05/2024   CHOLHDL 4.1 02/05/2024   Last hemoglobin A1c Lab Results  Component Value Date   HGBA1C 6.6 (H) 02/05/2024   Last thyroid  functions Lab Results  Component Value Date   TSH 1.410 02/05/2024   Last vitamin D  Lab Results  Component Value Date   VD25OH 18.5 (L) 02/05/2024   Last vitamin B12 and Folate Lab Results  Component Value Date   VITAMINB12 347 02/05/2024   FOLATE 5.6  02/05/2024   The 10-year ASCVD risk score (Arnett DK, et al., 2019) is: 30.5%    Assessment & Plan:   Problem List Items Addressed This Visit       Essential hypertension   Remains adequately controlled on current  antihypertensive regimen.      GERD (gastroesophageal reflux disease)   Symptoms are well-controlled with pantoprazole  40 mg twice daily.  He tried not taking pantoprazole  and experienced significant reflux symptoms.  Symptoms quickly improved with resuming PPI.      Type 2 diabetes mellitus with diabetic chronic kidney disease (HCC)   New diagnosis.  Meeting criteria with A1c 6.6 on labs in February.  We reviewed dietary changes in an effort to lower his blood sugar.  Will also add Jardiance 10 mg daily in the setting of diabetes mellitus and CKD with contraindications to ACE/ARB therapy.  Urine microalbumin/creatinine ratio ordered today.  He was recently seen by podiatry and plans to establish care with ophthalmology.      Chronic kidney disease, stage 3a (HCC)   Recent labs consistent with CKD 3A.  ACE/ARB contraindicated due to adverse side effects.  Through shared decision making, Jardiance 10 mg daily has been prescribed today.      Mixed hyperlipidemia - Primary   Lipid panel updated in February.  Total cholesterol 134 and LDL 85.  Increase pravastatin  to 20 mg daily for improved LDL control in the setting of diabetes mellitus.      Vitamin D  deficiency   Noted on previous labs.  He has completed high-dose, weekly vitamin D  supplementation.  Repeat vitamin D  level ordered today.      Return in about 3 months (around 08/20/2024).   Tobi Fortes, MD

## 2024-05-20 NOTE — Assessment & Plan Note (Signed)
 Symptoms are well-controlled with pantoprazole  40 mg twice daily.  He tried not taking pantoprazole  and experienced significant reflux symptoms.  Symptoms quickly improved with resuming PPI.

## 2024-05-21 ENCOUNTER — Ambulatory Visit: Payer: Self-pay | Admitting: Internal Medicine

## 2024-05-21 LAB — VITAMIN D 25 HYDROXY (VIT D DEFICIENCY, FRACTURES): Vit D, 25-Hydroxy: 34.3 ng/mL (ref 30.0–100.0)

## 2024-05-22 LAB — MICROALBUMIN / CREATININE URINE RATIO
Creatinine, Urine: 438 mg/dL
Microalb/Creat Ratio: 22 mg/g{creat} (ref 0–29)
Microalbumin, Urine: 96 ug/mL

## 2024-05-24 ENCOUNTER — Other Ambulatory Visit: Payer: Self-pay | Admitting: Internal Medicine

## 2024-05-24 DIAGNOSIS — R2689 Other abnormalities of gait and mobility: Secondary | ICD-10-CM | POA: Diagnosis not present

## 2024-05-24 DIAGNOSIS — M62562 Muscle wasting and atrophy, not elsewhere classified, left lower leg: Secondary | ICD-10-CM | POA: Diagnosis not present

## 2024-05-24 DIAGNOSIS — M25662 Stiffness of left knee, not elsewhere classified: Secondary | ICD-10-CM | POA: Diagnosis not present

## 2024-05-24 DIAGNOSIS — M25562 Pain in left knee: Secondary | ICD-10-CM | POA: Diagnosis not present

## 2024-05-26 DIAGNOSIS — M62562 Muscle wasting and atrophy, not elsewhere classified, left lower leg: Secondary | ICD-10-CM | POA: Diagnosis not present

## 2024-05-26 DIAGNOSIS — M25562 Pain in left knee: Secondary | ICD-10-CM | POA: Diagnosis not present

## 2024-05-26 DIAGNOSIS — R2689 Other abnormalities of gait and mobility: Secondary | ICD-10-CM | POA: Diagnosis not present

## 2024-05-26 DIAGNOSIS — M25662 Stiffness of left knee, not elsewhere classified: Secondary | ICD-10-CM | POA: Diagnosis not present

## 2024-05-30 DIAGNOSIS — M62562 Muscle wasting and atrophy, not elsewhere classified, left lower leg: Secondary | ICD-10-CM | POA: Diagnosis not present

## 2024-05-30 DIAGNOSIS — M25662 Stiffness of left knee, not elsewhere classified: Secondary | ICD-10-CM | POA: Diagnosis not present

## 2024-05-30 DIAGNOSIS — R2689 Other abnormalities of gait and mobility: Secondary | ICD-10-CM | POA: Diagnosis not present

## 2024-05-30 DIAGNOSIS — M25562 Pain in left knee: Secondary | ICD-10-CM | POA: Diagnosis not present

## 2024-06-01 ENCOUNTER — Telehealth: Payer: Self-pay

## 2024-06-01 DIAGNOSIS — M25562 Pain in left knee: Secondary | ICD-10-CM | POA: Diagnosis not present

## 2024-06-01 DIAGNOSIS — M25662 Stiffness of left knee, not elsewhere classified: Secondary | ICD-10-CM | POA: Diagnosis not present

## 2024-06-01 DIAGNOSIS — M62562 Muscle wasting and atrophy, not elsewhere classified, left lower leg: Secondary | ICD-10-CM | POA: Diagnosis not present

## 2024-06-01 DIAGNOSIS — R2689 Other abnormalities of gait and mobility: Secondary | ICD-10-CM | POA: Diagnosis not present

## 2024-06-01 NOTE — Telephone Encounter (Unsigned)
 Copied from CRM [id number]. Topic: Clinical - Medication Refill >> Jun 01, 2024  1:08 PM Nathanel C wrote: Medication: Vitamin D , Ergocalciferol , (DRISDOL ) 1.25 MG (50000 UNIT) CAPS capsule   Has the patient contacted their pharmacy? No   This is the patient's preferred pharmacy:  [facility] - [place], [place] - [address] [place] [address] Phone: [phone] Fax: [phone]  Is this the correct pharmacy for this prescription? Yes If no, delete pharmacy and type the correct one.   Has the prescription been filled recently? Yes  Is the patient out of the medication? Yes  Has the patient been seen for an appointment in the last year OR does the patient have an upcoming appointment? Yes  Can we respond through MyChart? No  Agent: Please be advised that Rx refills may take up to 3 business days. We ask that you follow-up with your pharmacy.

## 2024-06-02 ENCOUNTER — Other Ambulatory Visit: Payer: Self-pay

## 2024-06-02 DIAGNOSIS — E559 Vitamin D deficiency, unspecified: Secondary | ICD-10-CM

## 2024-06-02 MED ORDER — VITAMIN D (ERGOCALCIFEROL) 1.25 MG (50000 UNIT) PO CAPS: 50000.0000 [IU] | ORAL_CAPSULE | ORAL | 1 refills | Status: DC

## 2024-06-08 DIAGNOSIS — Z96652 Presence of left artificial knee joint: Secondary | ICD-10-CM | POA: Diagnosis not present

## 2024-06-08 DIAGNOSIS — M1712 Unilateral primary osteoarthritis, left knee: Secondary | ICD-10-CM | POA: Diagnosis not present

## 2024-06-08 DIAGNOSIS — G8929 Other chronic pain: Secondary | ICD-10-CM | POA: Diagnosis not present

## 2024-06-08 DIAGNOSIS — M25562 Pain in left knee: Secondary | ICD-10-CM | POA: Diagnosis not present

## 2024-06-14 DIAGNOSIS — M62562 Muscle wasting and atrophy, not elsewhere classified, left lower leg: Secondary | ICD-10-CM | POA: Diagnosis not present

## 2024-06-14 DIAGNOSIS — M25562 Pain in left knee: Secondary | ICD-10-CM | POA: Diagnosis not present

## 2024-06-14 DIAGNOSIS — M25662 Stiffness of left knee, not elsewhere classified: Secondary | ICD-10-CM | POA: Diagnosis not present

## 2024-06-14 DIAGNOSIS — R2689 Other abnormalities of gait and mobility: Secondary | ICD-10-CM | POA: Diagnosis not present

## 2024-06-15 DIAGNOSIS — E113292 Type 2 diabetes mellitus with mild nonproliferative diabetic retinopathy without macular edema, left eye: Secondary | ICD-10-CM | POA: Diagnosis not present

## 2024-06-15 DIAGNOSIS — H43813 Vitreous degeneration, bilateral: Secondary | ICD-10-CM | POA: Diagnosis not present

## 2024-06-15 DIAGNOSIS — D3132 Benign neoplasm of left choroid: Secondary | ICD-10-CM | POA: Diagnosis not present

## 2024-06-20 ENCOUNTER — Telehealth: Payer: Self-pay

## 2024-06-20 ENCOUNTER — Other Ambulatory Visit: Payer: Self-pay | Admitting: Internal Medicine

## 2024-06-20 NOTE — Telephone Encounter (Signed)
 Copied from CRM [id number]. Topic: Clinical - Medication Refill >> Jun 20, 2024  3:49 PM Nathanel BROCKS wrote: Medication: triazolam  (HALCION ) 0.25 MG tablet  Has the patient contacted their pharmacy? Yes  This is the patient's preferred pharmacy:  [facility] - [place], [place] - [address] [place] [address] Phone: [phone] Fax: [phone]  Is this the correct pharmacy for this prescription? Yes If no, delete pharmacy and type the correct one.   Has the prescription been filled recently? Yes  Is the patient out of the medication? Yes  Has the patient been seen for an appointment in the last year OR does the patient have an upcoming appointment? Yes  Can we respond through MyChart? No  Agent: Please be advised that Rx refills may take up to 3 business days. We ask that you follow-up with your pharmacy.

## 2024-06-21 DIAGNOSIS — M25662 Stiffness of left knee, not elsewhere classified: Secondary | ICD-10-CM | POA: Diagnosis not present

## 2024-06-21 DIAGNOSIS — M25562 Pain in left knee: Secondary | ICD-10-CM | POA: Diagnosis not present

## 2024-06-21 DIAGNOSIS — R2689 Other abnormalities of gait and mobility: Secondary | ICD-10-CM | POA: Diagnosis not present

## 2024-06-21 DIAGNOSIS — M62562 Muscle wasting and atrophy, not elsewhere classified, left lower leg: Secondary | ICD-10-CM | POA: Diagnosis not present

## 2024-06-28 ENCOUNTER — Other Ambulatory Visit: Payer: Self-pay | Admitting: Internal Medicine

## 2024-06-28 DIAGNOSIS — N1831 Chronic kidney disease, stage 3a: Secondary | ICD-10-CM

## 2024-06-28 DIAGNOSIS — E1122 Type 2 diabetes mellitus with diabetic chronic kidney disease: Secondary | ICD-10-CM

## 2024-07-06 DIAGNOSIS — M19071 Primary osteoarthritis, right ankle and foot: Secondary | ICD-10-CM | POA: Diagnosis not present

## 2024-07-06 DIAGNOSIS — M79671 Pain in right foot: Secondary | ICD-10-CM | POA: Diagnosis not present

## 2024-07-06 DIAGNOSIS — Q66222 Congenital metatarsus adductus, left foot: Secondary | ICD-10-CM | POA: Diagnosis not present

## 2024-07-06 DIAGNOSIS — M7671 Peroneal tendinitis, right leg: Secondary | ICD-10-CM | POA: Diagnosis not present

## 2024-07-06 DIAGNOSIS — M79672 Pain in left foot: Secondary | ICD-10-CM | POA: Diagnosis not present

## 2024-07-06 DIAGNOSIS — Q66221 Congenital metatarsus adductus, right foot: Secondary | ICD-10-CM | POA: Diagnosis not present

## 2024-07-06 DIAGNOSIS — M19072 Primary osteoarthritis, left ankle and foot: Secondary | ICD-10-CM | POA: Diagnosis not present

## 2024-07-13 ENCOUNTER — Ambulatory Visit: Payer: Self-pay

## 2024-07-13 NOTE — Telephone Encounter (Signed)
 FYI Only or Action Required?: Action required by provider: request for appointment.  Patient was last seen in primary care on 05/20/2024 by Melvenia Manus BRAVO, MD.  Called Nurse Triage reporting Groin Pain.  Symptoms began several weeks ago.  Interventions attempted: Rest, hydration, or home remedies.  Symptoms are: gradually worsening.  Triage Disposition: Go to ED Now (Notify PCP)  Patient/caregiver understands and will follow disposition?: No, refuses disposition  Copied from CRM #[id number]. Topic: Clinical - Red Word Triage >> Jul 13, 2024  9:41 AM Ivette P wrote: Red Word that prompted transfer to Nurse Triage: Groin pain - 3 weeks. could be aftermath of medication, last night pain level was around 8.   wondering if Jardience causing the pain.   Pravastatin  was recently upped as well. From 10 mg to 20 mg Reason for Disposition  SEVERE pain (e.g., excruciating)  Answer Assessment - Initial Assessment Questions 1. LOCATION and RADIATION: Where is the pain located?      Left sided groin from penis to left hip 2. QUALITY: What does the pain feel like?  (e.g., sharp, dull, aching, burning)     Sharp, searing 3. SEVERITY: How bad is the pain?  (Scale 1-10; or mild, moderate, severe)     Severe 8/10 4. ONSET: When did the pain start?     3 weeks ago 5. PATTERN: Does it come and go, or has it been constant since it started?     Constant 6. SCROTAL APPEARANCE: What does the scrotum look like? Is there any swelling or redness?      Denies 7. HERNIA: Has a doctor ever told you that you have a hernia?     No 8. OTHER SYMPTOMS: Do you have any other symptoms? (e.g., abdomen pain, difficulty passing urine, fever, vomiting)     No  Additional info: Refuses emergent evaluation. Would like to be worked into [facility] in the next day or two. I will wait til next week if I have too Urgent care is just a bandaid and they will send me to the ER, I'm not going to either.  Called to [facility] to inform that patient is refusing ER no answer sending high priority message.  Protocols used: Scrotum Pain-A-AH

## 2024-07-13 NOTE — Telephone Encounter (Signed)
 There is nothing available with anybody this week Ralph Marquez have no availability. Pt scheduled Monday with [name]

## 2024-07-18 ENCOUNTER — Ambulatory Visit (INDEPENDENT_AMBULATORY_CARE_PROVIDER_SITE_OTHER)

## 2024-07-18 ENCOUNTER — Ambulatory Visit (HOSPITAL_COMMUNITY)
Admission: RE | Admit: 2024-07-18 | Discharge: 2024-07-18 | Disposition: A | Discharge status: Home / Self Care | Source: Ambulatory Visit

## 2024-07-18 ENCOUNTER — Telehealth: Payer: Self-pay

## 2024-07-18 VITALS — BP 147/93 | HR 74 | Ht 70.0 in | Wt 216.0 lb

## 2024-07-18 DIAGNOSIS — R1032 Left lower quadrant pain: Secondary | ICD-10-CM | POA: Insufficient documentation

## 2024-07-18 DIAGNOSIS — R1030 Lower abdominal pain, unspecified: Secondary | ICD-10-CM | POA: Diagnosis not present

## 2024-07-18 DIAGNOSIS — I878 Other specified disorders of veins: Secondary | ICD-10-CM | POA: Diagnosis not present

## 2024-07-18 DIAGNOSIS — M1612 Unilateral primary osteoarthritis, left hip: Secondary | ICD-10-CM | POA: Diagnosis not present

## 2024-07-18 DIAGNOSIS — M25552 Pain in left hip: Secondary | ICD-10-CM | POA: Diagnosis not present

## 2024-07-18 NOTE — Telephone Encounter (Signed)
 Copied from CRM #[id number]. Topic: Clinical - Lab/Test Results >> Jul 18, 2024  3:28 PM Montie POUR wrote: Reason for CRM:  Bruna is calling to see what the results are from his X-ray. Please call her [phone]. Thanks

## 2024-07-18 NOTE — Progress Notes (Signed)
 Established Patient Office Visit  Subjective   Patient ID: Ralph Marquez, male    DOB: [birth year]  Age: 68 y.o. MRN: [id number]  Chief Complaint  Patient presents with   Groin Pain    Groin pain since last Monday     HPI  Patient complaining of left groin pain which started about 3 weeks ago.  He and his wife thought it may be due to medication side effects, so they stopped the Jardiance  and the pravastatin , but has not had relief of symptoms.  He had total left knee replacement back in March, and is still under the care of Ortho for this.  He reports constant burning feeling in his groin, is having difficulty ambulating due to pain.  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, left 02/09/2024   Essential hypertension 01/29/2024   Mixed hyperlipidemia 01/29/2024   GERD (gastroesophageal reflux disease) 01/29/2024   Psychophysiologic insomnia 01/29/2024   Primary osteoarthritis of left knee 01/29/2024   Anterior communicating artery aneurysm 01/29/2024   Family history of colon cancer 01/29/2024    ROS    Objective:     BP (!) 147/93   Pulse 74   Ht 5' 10 (1.778 m)   Wt 216 lb (98 kg)   SpO2 95%   BMI 30.99 kg/m  BP Readings from Last 3 Encounters:  07/18/24 (!) 147/93  05/20/24 131/89  02/10/24 128/80   Wt Readings from Last 3 Encounters:  07/18/24 216 lb (98 kg)  05/20/24 216 lb 12.8 oz (98.3 kg)  02/09/24 208 lb (94.3 kg)     Physical Exam Vitals and nursing note reviewed. Exam conducted with a chaperone present (Wife Bruna was present during visit today).  Constitutional:      Appearance: Normal appearance.  HENT:     Head: Normocephalic.  Eyes:     Extraocular Movements: Extraocular movements intact.     Pupils: Pupils are  equal, round, and reactive to light.  Cardiovascular:     Rate and Rhythm: Normal rate and regular rhythm.  Pulmonary:     Effort: Pulmonary effort is normal.     Breath sounds: Normal breath sounds.  Musculoskeletal:     Cervical back: Normal range of motion and neck supple.     Right hip: Normal.     Left hip: Tenderness (groin, no erythema or lymphadenopathy) present. Decreased range of motion.  Neurological:     Mental Status: He is alert and oriented to person, place, and time.  Psychiatric:        Mood and Affect: Mood normal.        Thought Content: Thought content normal.      No results found for any visits on 07/18/24.  Last CBC Lab Results  Component Value Date   WBC 11.1 (H) 02/05/2024   HGB 13.9 02/05/2024   HCT 41.9 02/05/2024   MCV 94.2 02/05/2024   MCH 31.2 02/05/2024   RDW 13.2 02/05/2024   PLT 338 02/05/2024   Last metabolic panel Lab Results  Component Value Date   GLUCOSE 125 (H) 02/29/2024   NA 139 02/29/2024   K 4.5 02/29/2024   CL 102 02/29/2024   CO2 22 02/29/2024   BUN 30 (H) 02/29/2024   CREATININE 1.35 (H)  02/29/2024   EGFR 58 (L) 02/29/2024   CALCIUM 9.3 02/29/2024   PROT 7.2 02/05/2024   ALBUMIN 3.6 02/05/2024   LABGLOB 2.4 02/05/2024   BILITOT 0.8 02/05/2024   ALKPHOS 64 02/05/2024   AST 15 02/05/2024   ALT 20 02/05/2024   ANIONGAP 8 02/05/2024   Last lipids Lab Results  Component Value Date   CHOL 134 02/05/2024   HDL 33 (L) 02/05/2024   LDLCALC 85 02/05/2024   TRIG 79 02/05/2024   CHOLHDL 4.1 02/05/2024   Last hemoglobin A1c Lab Results  Component Value Date   HGBA1C 6.6 (H) 02/05/2024   Last thyroid  functions Lab Results  Component Value Date   TSH 1.410 02/05/2024   Last vitamin D  Lab Results  Component Value Date   VD25OH 34.3 05/20/2024      The 10-year ASCVD risk score (Arnett DK, et al., 2019) is: 36.1%    Assessment & Plan:   Problem List Items Addressed This Visit   None Visit Diagnoses        Left groin pain    -  Primary   Suspect this may be related to recent left knee replacement.  Will obtain UA and left hip x-ray today.  Recommend he resume medications.   Relevant Orders   Urinalysis   DG HIP UNILAT W OR W/O PELVIS 2-3 VIEWS LEFT       Return for as needed.    [name] Longs, FNP

## 2024-07-18 NOTE — Telephone Encounter (Signed)
 Results have not been received, can take up to one week

## 2024-07-19 ENCOUNTER — Telehealth: Payer: Self-pay

## 2024-07-19 ENCOUNTER — Ambulatory Visit: Payer: Self-pay

## 2024-07-19 LAB — URINALYSIS
Bilirubin, UA: NEGATIVE
Ketones, UA: NEGATIVE
Leukocytes,UA: NEGATIVE
Nitrite, UA: NEGATIVE
Specific Gravity, UA: >1.03 — ABNORMAL HIGH (ref 1.005–1.030)
Urobilinogen, Ur: 0.2 mg/dL (ref 0.2–1.0)
pH, UA: 5.5 (ref 5.0–7.5)

## 2024-07-19 NOTE — Telephone Encounter (Signed)
 FYI Only or Action Required?: FYI only for provider.  Patient was last seen in primary care on 07/18/2024 by Bevely Doffing, FNP.  Called Nurse Triage reporting Information Only .  Symptoms began several days ago.    Symptoms are: unchanged.  Triage Disposition: Call PCP When Office is Open  Patient/caregiver understands and will follow disposition?: Yes  **See note below**         Reason for Disposition  [1] Caller requesting NON-URGENT health information AND [2] PCP's office is the best resource  Answer Assessment - Initial Assessment Questions 1. REASON FOR CALL: What is the main reason for your call? or How can I best help you?   Wife is requesting that the office call to find out exactly when the Xray results will be available, she stated she knows the results are not back; but she wants to know when. Wife is requesting a call back before the day ends today.  Protocols used: Information Only Call - No Triage-A-AH

## 2024-07-19 NOTE — Telephone Encounter (Signed)
 Awaitng xray results.

## 2024-07-19 NOTE — Telephone Encounter (Signed)
 Patient wife requesting a call in regard to xray results when they will be back.

## 2024-07-19 NOTE — Telephone Encounter (Signed)
 Copied from CRM [id number]. Topic: General - Other >> Jul 19, 2024  3:13 PM Kevelyn M wrote: Reason for CRM: Patient called in upset and wanted to speak to a nurse because she wanted her husband's x-rays results back. It was explained to her that results were not back yet. She insisted  on speaking to a nurse.

## 2024-07-19 NOTE — Telephone Encounter (Signed)
 FYI Only or Action Required?: Action required by provider: request for appointment.  Patient was last seen in primary care on 07/18/2024 by Bevely Doffing, FNP.  Called Nurse Triage reporting Groin Pain.  Symptoms began several weeks ago.  Interventions attempted: Nothing.  Symptoms are: gradually worsening. Wife calling for xray results from yesterday. I want  a call back this morning.   Triage Disposition: See Physician Within 24 Hours  Patient/caregiver understands and will follow disposition?: No, wishes to speak with PCP   Copied from CRM [id number]. Topic: Clinical - Red Word Triage >> Jul 19, 2024  9:48 AM Ralph Marquez wrote: Red Word that prompted transfer to Nurse Triage: Severe pain- groin area. Reason for Disposition  [1] Pain comes and goes (intermittent) AND [2] present > 24 hours  Answer Assessment - Initial Assessment Questions 1. LOCATION and RADIATION: Where is the pain located?      groin 2. QUALITY: What does the pain feel like?  (e.g., sharp, dull, aching, burning)     ache 3. SEVERITY: How bad is the pain?  (Scale 1-10; or mild, moderate, severe)   - MILD (1-3): doesn't interfere with normal activities    - MODERATE (4-7): interferes with normal activities (e.g., work or school) or awakens from sleep   - SEVERE (8-10): excruciating pain, unable to do any normal activities, difficulty walking     severe 4. ONSET: When did the pain start?     Last week 5. PATTERN: Does it come and go, or has it been constant since it started?     Getting worse 6. SCROTAL APPEARANCE: What does the scrotum look like? Is there any swelling or redness?      N/a 7. HERNIA: Has a doctor ever told you that you have a hernia?     no 8. OTHER SYMPTOMS: Do you have any other symptoms? (e.g., fever, abdominal pain, vomiting, difficulty passing urine)     no  Protocols used: Scrotal Pain-A-AH

## 2024-07-20 ENCOUNTER — Other Ambulatory Visit: Payer: Self-pay

## 2024-07-20 DIAGNOSIS — R1032 Left lower quadrant pain: Secondary | ICD-10-CM

## 2024-07-20 NOTE — Telephone Encounter (Signed)
 Patient wife called saying 3rd time calling requesting to speak to nurse patient upset regarding xray results.

## 2024-07-21 ENCOUNTER — Ambulatory Visit (HOSPITAL_COMMUNITY)
Admission: RE | Admit: 2024-07-21 | Discharge: 2024-07-21 | Disposition: A | Discharge status: Home / Self Care | Source: Ambulatory Visit

## 2024-07-21 DIAGNOSIS — N201 Calculus of ureter: Secondary | ICD-10-CM

## 2024-07-21 DIAGNOSIS — K439 Ventral hernia without obstruction or gangrene: Secondary | ICD-10-CM | POA: Diagnosis not present

## 2024-07-21 DIAGNOSIS — N200 Calculus of kidney: Secondary | ICD-10-CM

## 2024-07-21 DIAGNOSIS — K5730 Diverticulosis of large intestine without perforation or abscess without bleeding: Secondary | ICD-10-CM | POA: Diagnosis not present

## 2024-07-21 DIAGNOSIS — R1032 Left lower quadrant pain: Secondary | ICD-10-CM | POA: Diagnosis not present

## 2024-07-21 HISTORY — DX: Calculus of ureter: N20.1

## 2024-07-21 HISTORY — DX: Calculus of kidney: N20.0

## 2024-07-22 ENCOUNTER — Other Ambulatory Visit: Payer: Self-pay

## 2024-07-22 MED ORDER — TRIAZOLAM 0.25 MG PO TABS: 0.2500 mg | ORAL_TABLET | Freq: Every evening | ORAL | 0 refills | Status: DC | PRN

## 2024-07-22 NOTE — Telephone Encounter (Signed)
 Copied from CRM #[id number]. Topic: Clinical - Medication Refill >> Jul 22, 2024 11:54 AM Zebedee SAUNDERS wrote: Medication: triazolam  (HALCION ) 0.25 MG tablet  Has the patient contacted their pharmacy? Yes (Agent: If no, request that the patient contact the pharmacy for the refill. If patient does not wish to contact the pharmacy document the reason why and proceed with request.) (Agent: If yes, when and what did the pharmacy advise?)Pharmacy need PCP approval  This is the patient's preferred pharmacy:  [facility] - [place], [place] - [address] [place] [address] Phone: [phone] Fax: [phone]  Is this the correct pharmacy for this prescription? Yes If no, delete pharmacy and type the correct one.   Has the prescription been filled recently? Yes  Is the patient out of the medication? Yes  Has the patient been seen for an appointment in the last year OR does the patient have an upcoming appointment? Yes  Can we respond through MyChart? Yes  Agent: Please be advised that Rx refills may take up to 3 business days. We ask that you follow-up with your pharmacy.

## 2024-07-25 ENCOUNTER — Telehealth: Payer: Self-pay

## 2024-07-25 ENCOUNTER — Ambulatory Visit: Payer: Self-pay | Admitting: *Deleted

## 2024-07-25 ENCOUNTER — Other Ambulatory Visit: Payer: Self-pay

## 2024-07-25 DIAGNOSIS — M1712 Unilateral primary osteoarthritis, left knee: Secondary | ICD-10-CM | POA: Diagnosis not present

## 2024-07-25 DIAGNOSIS — M1612 Unilateral primary osteoarthritis, left hip: Secondary | ICD-10-CM | POA: Insufficient documentation

## 2024-07-25 DIAGNOSIS — F5104 Psychophysiologic insomnia: Secondary | ICD-10-CM

## 2024-07-25 DIAGNOSIS — Z96652 Presence of left artificial knee joint: Secondary | ICD-10-CM | POA: Diagnosis not present

## 2024-07-25 DIAGNOSIS — G8929 Other chronic pain: Secondary | ICD-10-CM | POA: Diagnosis not present

## 2024-07-25 DIAGNOSIS — M25552 Pain in left hip: Secondary | ICD-10-CM | POA: Diagnosis not present

## 2024-07-25 DIAGNOSIS — M25562 Pain in left knee: Secondary | ICD-10-CM | POA: Diagnosis not present

## 2024-07-25 MED ORDER — TRIAZOLAM 0.25 MG PO TABS
0.2500 mg | ORAL_TABLET | Freq: Every evening | ORAL | 2 refills | Status: DC | PRN
Start: 2024-07-25 — End: 2024-10-19

## 2024-07-25 NOTE — Telephone Encounter (Unsigned)
 Copied from CRM #[id number]. Topic: Clinical - Medication Refill >> Jul 25, 2024  9:36 AM Tanazia G wrote: Medication:  triazolam  (HALCION ) 0.25 MG tablet   Has the patient contacted their pharmacy? Yes (Agent: If no, request that the patient contact the pharmacy for the refill. If patient does not wish to contact the pharmacy document the reason why and proceed with request.) (Agent: If yes, when and what did the pharmacy advise?)  This is the patient's preferred pharmacy:  [facility] - [place], [place] - [address] [place] [address] Phone: [phone] Fax: [phone]  Is this the correct pharmacy for this prescription? Yes If no, delete pharmacy and type the correct one.   Has the prescription been filled recently? Yes  Is the patient out of the medication? Yes  Has the patient been seen for an appointment in the last year OR does the patient have an upcoming appointment? Yes  Can we respond through MyChart? Yes  Agent: Please be advised that Rx refills may take up to 3 business days. We ask that you follow-up with your pharmacy.

## 2024-07-25 NOTE — Telephone Encounter (Signed)
 Awaiting for them to be read

## 2024-07-25 NOTE — Telephone Encounter (Signed)
 FYI Only or Action Required?: Action required by provider: lab or test result follow-up needed.  Patient was last seen in primary care on 07/18/2024 by Ralph Doffing, FNP.  Called Nurse Triage reporting Results.  Symptoms began several days ago.  Interventions attempted: Other: n/a.  Symptoms are: unchanged.  Triage Disposition: Call PCP Within 24 Hours  Patient/caregiver understands and will follow disposition?: Yes    Reason for Disposition  Caller requesting lab results  (Exception: Routine or non-urgent lab result.)  Answer Assessment - Initial Assessment Questions 1. REASON FOR CALL: What is the main reason for your call? or How can I best help you?     Calling for CT scan result- aware it has resulted- but would like to know result and plan.  2. SYMPTOMS : Do you have any symptoms?      Patient reports still in pain.  3. OTHER QUESTIONS: Do you have any other questions?     When can he expect x-ray result? Also see Rx request  Protocols used: Information Only Call - No Triage-A-AH, PCP Call - No Triage-A-AH   Copied from CRM [id number]. Topic: Clinical - Lab/Test Results >> Jul 25, 2024  9:47 AM Ralph Marquez wrote: Reason for CRM: [name] called in requesting to speak with a nurse in regards to patient's recent CT test.

## 2024-07-25 NOTE — Telephone Encounter (Signed)
 Copied from CRM #[id number]. Topic: Clinical - Lab/Test Results >> Jul 25, 2024  9:37 AM Mercedes MATSU wrote: Reason for CRM: Patient is requesting a call back in regards to CT imaging results and can be reached at [phone].

## 2024-07-26 ENCOUNTER — Ambulatory Visit: Payer: Self-pay

## 2024-07-26 ENCOUNTER — Telehealth: Payer: Self-pay

## 2024-07-26 DIAGNOSIS — K862 Cyst of pancreas: Secondary | ICD-10-CM

## 2024-07-26 DIAGNOSIS — N201 Calculus of ureter: Secondary | ICD-10-CM

## 2024-07-26 NOTE — Telephone Encounter (Signed)
 [name], with patient on the phone, would like to request a transfer of care from current PCP, Ms. Huenink, to Ms. Boswell.  Patient and patient's wife are unsatisfied with communication due to recent labs and imaging.   Patient scheduled to see Ms. Boswell 07/27/24 to address kidney stones pain.

## 2024-07-27 ENCOUNTER — Telehealth: Payer: Self-pay

## 2024-07-27 ENCOUNTER — Other Ambulatory Visit: Payer: Self-pay | Admitting: Nurse Practitioner

## 2024-07-27 ENCOUNTER — Ambulatory Visit (INDEPENDENT_AMBULATORY_CARE_PROVIDER_SITE_OTHER): Admitting: Nurse Practitioner

## 2024-07-27 ENCOUNTER — Encounter: Payer: Self-pay | Admitting: Nurse Practitioner

## 2024-07-27 VITALS — BP 162/99 | HR 84 | Ht 70.0 in | Wt 214.0 lb

## 2024-07-27 DIAGNOSIS — N200 Calculus of kidney: Secondary | ICD-10-CM

## 2024-07-27 DIAGNOSIS — K869 Disease of pancreas, unspecified: Secondary | ICD-10-CM

## 2024-07-27 DIAGNOSIS — K760 Fatty (change of) liver, not elsewhere classified: Secondary | ICD-10-CM | POA: Diagnosis not present

## 2024-07-27 DIAGNOSIS — R1032 Left lower quadrant pain: Secondary | ICD-10-CM | POA: Diagnosis not present

## 2024-07-27 DIAGNOSIS — K862 Cyst of pancreas: Secondary | ICD-10-CM

## 2024-07-27 MED ORDER — TAMSULOSIN HCL 0.4 MG PO CAPS: 0.4000 mg | ORAL_CAPSULE | Freq: Every day | ORAL | 0 refills | Status: DC

## 2024-07-27 MED ORDER — CELECOXIB 50 MG PO CAPS: 50.0000 mg | ORAL_CAPSULE | Freq: Two times a day (BID) | ORAL | 1 refills | Status: DC

## 2024-07-27 NOTE — Patient Instructions (Signed)
 1) MRI and MRCP with and without contrast for pancreatic cystic lesion evaluation found on CT abd 2) Kidney stones - urology referral, toradol , push water   3) Lumbar DDD and spondylosis - follow up in future, IBU/tylenol /heating pad 4) Follow up in 4 months, fasting labs at that appt

## 2024-07-27 NOTE — Progress Notes (Signed)
 Established Patient Office Visit  Subjective:  Patient ID: Ralph Marquez, male    DOB: 1956/09/24  Age: 68 y.o. MRN: [id number]  Chief Complaint  Patient presents with   CT results    CT results follow up    Transitions Of Care    Transfer of care from [name] to [facility]    Patient here today to review recent CT abdomen/pelvis done on 07/21/24.  Discussed all pertinent results with patient and wife.  Renal calculi bilaterally and also 5 mm stone and having severe pain and hematuria.  Patient is agreeable to treatment today for the 5 mm stone and urology referral to discuss options of the bilateral calculi.  Patient also has chronic low back pain and discussed findings of DDD and spondylosis.  Also most importantly patient was told of pancreatic ventral neck lesion of which MRI and MRCP of abdomen is recommended for further follow up.      No other concerns at this time.   Past Medical History:  Diagnosis Date   Anterior communicating artery aneurysm 2019   GERD (gastroesophageal reflux disease)    Hypertension    Psychophysiologic insomnia 01/29/2024    Past Surgical History:  Procedure Laterality Date   aneurysm surgery Bilateral 2019   bone spur shoulder     ESOPHAGOGASTRODUODENOSCOPY N/A 07/30/2017   Procedure: ESOPHAGOGASTRODUODENOSCOPY (EGD) Possible esophageal dilation.;  Surgeon: Golda Claudis PENNER, MD;  Location: [facility];  Service: Endoscopy;  Laterality: N/A;   REPLACEMENT TOTAL KNEE Right    TOTAL KNEE ARTHROPLASTY Left 02/09/2024   Procedure: TOTAL KNEE ARTHROPLASTY;  Surgeon: Edie Norleen PARAS, MD;  Location: [facility];  Service: Orthopedics;  Laterality: Left;    Social History   Socioeconomic History   Marital status: Married    Spouse name: Garment/textile technologist   Number of children: Not on file   Years of education: Not on file   Highest education level: Not on file  Occupational History   Not on file  Tobacco Use   Smoking status: Former    Types: Cigarettes   Smokeless  tobacco: Never   Tobacco comments:    40 yrs quit 3 yrs ago  Vaping Use   Vaping status: Never Used  Substance and Sexual Activity   Alcohol use: No   Drug use: No   Sexual activity: Not on file  Other Topics Concern   Not on file  Social History Narrative   Not on file   Social Drivers of Health   Financial Resource Strain: Low Risk  (06/08/2024)   Received from [facility]   Overall Financial Resource Strain (CARDIA)    Difficulty of Paying Living Expenses: Not hard at all  Food Insecurity: No Food Insecurity (06/08/2024)   Received from [facility]   Hunger Vital Sign    Within the past 12 months, you worried that your food would run out before you got the money to buy more.: Never true    Within the past 12 months, the food you bought just didn't last and you didn't have money to get more.: Never true  Transportation Needs: No Transportation Needs (06/08/2024)   Received from [facility] - Transportation    In the past 12 months, has lack of transportation kept you from medical appointments or from getting medications?: No    Lack of Transportation (Non-Medical): No  Physical Activity: Not on file  Stress: Not on file  Social Connections:  Moderately Isolated (02/09/2024)   Social Connection and Isolation Panel    Frequency of Communication with Friends and Family: Twice a week    Frequency of Social Gatherings with Friends and Family: Once a week    Attends Religious Services: Never    Database administrator or Organizations: No    Attends Banker Meetings: Never    Marital Status: Married  Catering manager Violence: Not At Risk (02/09/2024)   Humiliation, Afraid, Rape, and Kick questionnaire    Fear of Current or Ex-Partner: No    Emotionally Abused: No    Physically Abused: No    Sexually Abused: No    Family History  Problem Relation Age of Onset   CAD Mother        CABG in 33's   CVA Sister      Allergies  Allergen Reactions   Amlodipine  Hives   Lisinopril Hives   Losartan Potassium Hives   Betadine [Povidone Iodine] Rash    Outpatient Medications Prior to Visit  Medication Sig   apixaban  (ELIQUIS ) 2.5 MG TABS tablet Take 1 tablet (2.5 mg total) by mouth 2 (two) times daily.   diltiazem  (CARDIZEM  CD) 360 MG 24 hr capsule Take 1 capsule (360 mg total) by mouth daily.   empagliflozin  (JARDIANCE ) 10 MG TABS tablet Take 1 tablet (10 mg total) by mouth daily.   etodolac (LODINE) 500 MG tablet Take 1 tablet by mouth 2 (two) times daily.   ondansetron  (ZOFRAN ) 4 MG tablet Take 1 tablet (4 mg total) by mouth every 6 (six) hours as needed for nausea.   oxyCODONE  (ROXICODONE ) 5 MG immediate release tablet Take 1-2 tablets (5-10 mg total) by mouth every 4 (four) hours as needed for moderate pain (pain score 4-6) or severe pain (pain score 7-10).   pantoprazole  (PROTONIX ) 40 MG tablet Take 1 tablet (40 mg total) by mouth 2 (two) times daily before a meal.   pravastatin  (PRAVACHOL ) 20 MG tablet Take 1 tablet (20 mg total) by mouth daily.   spironolactone  (ALDACTONE ) 25 MG tablet Take 0.5 tablets (12.5 mg total) by mouth daily.   triazolam  (HALCION ) 0.25 MG tablet Take 1 tablet (0.25 mg total) by mouth at bedtime as needed for sleep.   Vitamin D , Ergocalciferol , (DRISDOL ) 1.25 MG (50000 UNIT) CAPS capsule Take 1 capsule (50,000 Units total) by mouth every 7 (seven) days.   No facility-administered medications prior to visit.    ROS     Objective:   BP (!) 162/99   Pulse 84   Ht 5' 10 (1.778 m)   Wt 214 lb (97.1 kg)   SpO2 96%   BMI 30.71 kg/m   Vitals:   07/27/24 0954  BP: (!) 162/99  Pulse: 84  Height: 5' 10 (1.778 m)  Weight: 214 lb (97.1 kg)  SpO2: 96%  BMI (Calculated): 30.71    Physical Exam Vitals and nursing note reviewed.  Constitutional:      Appearance: Normal appearance.  HENT:     Head: Normocephalic.     Nose: Nose normal.     Mouth/Throat:      Mouth: Mucous membranes are moist.  Cardiovascular:     Rate and Rhythm: Normal rate and regular rhythm.     Pulses: Normal pulses.     Heart sounds: Normal heart sounds.  Pulmonary:     Effort: Pulmonary effort is normal.     Breath sounds: Normal breath sounds.  Musculoskeletal:  General: Tenderness present.     Cervical back: Normal range of motion and neck supple.  Skin:    General: Skin is warm and dry.  Neurological:     Mental Status: He is alert and oriented to person, place, and time.  Psychiatric:        Mood and Affect: Mood normal.        Behavior: Behavior normal.      No results found for any visits on 07/27/24.  Recent Results (from the past 2160 hours)  Urine Microalbumin w/creat. ratio     Status: None   Collection Time: 05/20/24 11:00 AM  Result Value Ref Range   Creatinine, Urine 438.0 Not Estab. mg/dL   Microalbumin, Urine 03.9 Not Estab. ug/mL   Microalb/Creat Ratio 22 0 - 29 mg/g creat    Comment:                        Normal:                0 -  29                        Moderately increased: 30 - 300                        Severely increased:       >300   Vitamin D  (25 hydroxy)     Status: None   Collection Time: 05/20/24 11:23 AM  Result Value Ref Range   Vit D, 25-Hydroxy 34.3 30.0 - 100.0 ng/mL    Comment: Vitamin D  deficiency has been defined by the Institute of Medicine and an Endocrine Society practice guideline as a level of serum 25-OH vitamin D  less than 20 ng/mL (1,2). The Endocrine Society went on to further define vitamin D  insufficiency as a level between 21 and 29 ng/mL (2). 1. IOM (Institute of Medicine). 2010. Dietary reference    intakes for calcium and D. [place]: The    [employer]. 2. Holick MF, Binkley Enon, Bischoff-Ferrari HA, et al.    Evaluation, treatment, and prevention of vitamin D     deficiency: an Endocrine Society clinical practice    guideline. JCEM. 2011 Jul; 96(7):1911-30.    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   Total time spent: 20 minutes  Neale Carpen, NP  07/27/2024   This document may have been prepared by [facility] Voice Recognition software and as such may include unintentional dictation errors.

## 2024-07-27 NOTE — Telephone Encounter (Signed)
 Copied from CRM #[id number]. Topic: Referral - Question >> Jul 27, 2024  1:24 PM Montie POUR wrote: Reason for CRM:  Bruna called and wants Ralph Marquez's Urology referral sent to office with first available appointment. You can send to [place], [place], [place]. Please call Bruna with any questions at [phone] Thanks

## 2024-07-27 NOTE — Telephone Encounter (Signed)
 Referral has been sent to [facility] at this time.

## 2024-07-28 ENCOUNTER — Other Ambulatory Visit: Payer: Self-pay | Admitting: Urology

## 2024-07-28 ENCOUNTER — Encounter (HOSPITAL_COMMUNITY): Payer: Self-pay | Admitting: Urology

## 2024-07-28 DIAGNOSIS — N201 Calculus of ureter: Secondary | ICD-10-CM | POA: Diagnosis not present

## 2024-07-28 NOTE — H&P (Signed)
 Office Visit Report     07/28/2024   --------------------------------------------------------------------------------   Ralph Marquez. Labell  MRN: [id number]  DOB: April 02, 1956, 68 year old Male  SSN:    PRIMARY CARE:     REFERRING:    PROVIDER:  Donnice Brooks, M.D.  LOCATION:  [facility] [phone]     --------------------------------------------------------------------------------   CC/HPI: New patient-   1) urolithiasis-patient had left flank pain and went to see his PCP. A July 21, 2024 when CT scan revealed a 5 to 6 mm left proximal stone. Small bilateral renal stones. Difficult to see the stone on the scout image. HU 572. No labs.   Today, added on for left groin pain. Pain left flank. He has not seen a stone pass. On tamsulosin , celecoxib  and oxycodone . KUB with 4 mm left proximal stone outside of renal shadow. Confirmed he was on apixaban  for DVT prophylaxis in Mar 2025 and she wrote down not sure of what it was because it was on hsopital list. He showed me pic of his meds - on asa 81 mg. No apixaban . I gave him 15 toradol  IM. Felt much better.   No prior stones.   He had a left knee replacement Mar 2025.     ALLERGIES: No Allergies    MEDICATIONS: Tamsulosin  HCl 1 PO Daily  Celecoxib  1 PO Daily  dilTIAZem  HCl 1 PO Daily  Eliquis  1 PO Daily  Etodolac 1 PO Daily  Jardiance  1 PO Daily  Ondansetron  1 PO Daily  oxyCODONE  HCl 1 PO Daily  oxyCODONE  HCl 1 PO Daily  Pantoprazole  Sodium 1 PO Daily  Pravastatin  Sodium 1 PO Daily  Pravastatin  Sodium 1 PO Daily  Spironolactone  1 PO Daily  Triazolam  1 PO Daily  Vitamin D  1 PO Daily     GU PSH: None   NON-GU PSH: Knee replacement     GU PMH: No GU PMH    NON-GU PMH: No Non-GU PMH    FAMILY HISTORY: No Family History    SOCIAL HISTORY: Marital Status: Married Current Smoking Status: Patient has never smoked.   Tobacco Use Assessment Completed: Used Tobacco in last 30 days? Drinks 2  caffeinated drinks per day.    REVIEW OF SYSTEMS:    GU Review Male:   blood in urine.  Patient reports frequent urination, burning/ pain with urination, and get up at night to urinate. Patient denies hard to postpone urination, leakage of urine, stream starts and stops, trouble starting your stream, have to strain to urinate , erection problems, and penile pain.  Gastrointestinal (Upper):   Patient denies nausea, vomiting, and indigestion/ heartburn.  Gastrointestinal (Lower):   Patient denies diarrhea and constipation.  Constitutional:   Patient denies fever, night sweats, weight loss, and fatigue.  Skin:   Patient denies skin rash/ lesion and itching.  Eyes:   Patient reports blurred vision. Patient denies double vision.  Ears/ Nose/ Throat:   Patient denies sinus problems and sore throat.  Hematologic/Lymphatic:   Patient denies swollen glands and easy bruising.  Cardiovascular:   Patient denies leg swelling and chest pains.  Respiratory:   Patient denies cough and shortness of breath.  Endocrine:   Patient denies excessive thirst.  Musculoskeletal:   Patient reports joint pain. Patient denies back pain.  Neurological:   Patient denies headaches and dizziness.  Psychologic:   Patient denies depression and anxiety.   VITAL SIGNS:      07/28/2024 03:03 PM  Weight 215 lb / 97.52 kg  Height 70 in / 177.8 cm  BP 118/72 mmHg  Pulse 65 /min  Temperature 97.3 F / 36.2 C  BMI 30.8 kg/m   MULTI-SYSTEM PHYSICAL EXAMINATION:    Constitutional: Well-nourished. No physical deformities. Normally developed. Good grooming.  Neck: Neck symmetrical, not swollen. Normal tracheal position.  Respiratory: No labored breathing, no use of accessory muscles.   Cardiovascular: Normal temperature, normal extremity pulses, no swelling, no varicosities.  Lymphatic: No enlargement of neck, axillae, groin.  Skin: No paleness, no jaundice, no cyanosis. No lesion, no ulcer, no rash.  Neurologic / Psychiatric:  Oriented to time, oriented to place, oriented to person. No depression, no anxiety, no agitation.  Gastrointestinal: No mass, no tenderness, no rigidity, non obese abdomen.  Eyes: Normal conjunctivae. Normal eyelids.  Ears, Nose, Mouth, and Throat: Left ear no scars, no lesions, no masses. Right ear no scars, no lesions, no masses. Nose no scars, no lesions, no masses. Normal hearing. Normal lips.  Musculoskeletal: Normal gait and station of head and neck.     Complexity of Data:  X-Ray Review: C.T. Abdomen/Pelvis: Reviewed Films. Discussed With Patient. 2025    PROCEDURES:         KUB - 74018  A single view of the abdomen is obtained.  Calculi:  4 mm left prox stone      The bones appeared normal. The bowel gas pattern appeared normal. The soft tissues were unremarkable. . Patient confirmed No Neulasta OnPro Device.           Visit Complexity - G2211 Chronic management         Urinalysis w/Scope - 81001 Dipstick Dipstick Cont'd Micro  Color: Yellow Bilirubin: Neg WBC/hpf: NS (Not Seen)  Appearance: Clear Ketones: Neg RBC/hpf: 0 - 2/hpf  Specific Gravity: 1.025 Blood: Trace Bacteria: Few (10-25/hpf)  pH: <=5.0 Protein: Neg Cystals: NS (Not Seen)  Glucose: 3+ Urobilinogen: 0.2 Casts: NS (Not Seen)    Nitrites: Neg Trichomonas: Not Present    Leukocyte Esterase: Neg Mucous: Present      Epithelial Cells: 6 - 10/hpf      Yeast: NS (Not Seen)      Sperm: Not Present    Notes:            Ketoralac 30mg  - J1885, [id number]  Pt L buttock was prepped with an alcohol swab. 15g was injected into L buttock and covered with a Bandaid. Pt tolerated well.  15mg  WASTED.   Qty: 15 Adm. By: Tinnie Staff  Unit: mg Adm. On: 07/28/2024 04:35 PM  Route: IM Lot No: [id number]  Freq: None Exp. 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 All questions answered. He will proceed with eswl. He wants to avoid a stent but understands the stone is on the smaller end of the visible spectrum and he may need a staged procedure.   PLAN:           Orders X-Rays: KUB          Schedule Return Visit/Planned Activity: ASAP - Schedule Surgery          Document Letter(s):  Created for Patient: Clinical Summary         Next Appointment:  Next Appointment: 07/29/2024 03:30 PM    Appointment Type: Surgery     Location: [facility] [phone]    Provider: Donnice Brooks, M.D.    Reason for Visit: LT ESWL      * Signed by Donnice Brooks, M.D. on 07/28/24 at 5:37 PM (EDT)*

## 2024-07-28 NOTE — Progress Notes (Signed)
 Called and spoke via phone w/ patient and his wife for scheduled ESWL 07-29-2024.  Completed medication list and medical history.  To bring insurance card, photo ID, and blue folder dos. Arrival at 1330 npo after midnight with exception clear liquids until 1130.  Pt will take only cardizem  morning of surgery.  Stated last dose celebrex  am today.  He does not think he takes etodolac, stated will check and let nurse know dos.  Took exlax this afternoon.  Driver-- son, Ralph Marquez, caregiver--- wife, Ralph Marquez.  Patient wife and pt verbalized understanding of instructions without further questions.

## 2024-07-29 ENCOUNTER — Ambulatory Visit (HOSPITAL_COMMUNITY)
Admission: RE | Admit: 2024-07-29 | Discharge: 2024-07-29 | Disposition: A | Discharge status: Home / Self Care | Attending: Urology | Admitting: Urology

## 2024-07-29 ENCOUNTER — Encounter (HOSPITAL_COMMUNITY)
Admission: RE | Disposition: A | Discharge status: Home / Self Care | Payer: Self-pay | Source: Home / Self Care | Attending: Urology

## 2024-07-29 ENCOUNTER — Ambulatory Visit (HOSPITAL_COMMUNITY)

## 2024-07-29 ENCOUNTER — Ambulatory Visit: Payer: Medicare Other | Admitting: Internal Medicine

## 2024-07-29 ENCOUNTER — Encounter (HOSPITAL_COMMUNITY): Payer: Self-pay | Admitting: Urology

## 2024-07-29 ENCOUNTER — Telehealth: Payer: Self-pay

## 2024-07-29 ENCOUNTER — Other Ambulatory Visit: Payer: Self-pay

## 2024-07-29 DIAGNOSIS — I10 Essential (primary) hypertension: Secondary | ICD-10-CM | POA: Diagnosis not present

## 2024-07-29 DIAGNOSIS — Z7982 Long term (current) use of aspirin: Secondary | ICD-10-CM | POA: Insufficient documentation

## 2024-07-29 DIAGNOSIS — N201 Calculus of ureter: Secondary | ICD-10-CM | POA: Insufficient documentation

## 2024-07-29 DIAGNOSIS — E119 Type 2 diabetes mellitus without complications: Secondary | ICD-10-CM | POA: Insufficient documentation

## 2024-07-29 DIAGNOSIS — Z7901 Long term (current) use of anticoagulants: Secondary | ICD-10-CM | POA: Insufficient documentation

## 2024-07-29 DIAGNOSIS — R1032 Left lower quadrant pain: Secondary | ICD-10-CM | POA: Diagnosis not present

## 2024-07-29 HISTORY — DX: Unspecified osteoarthritis, unspecified site: M19.90

## 2024-07-29 HISTORY — DX: Chronic kidney disease, stage 3 unspecified: N18.30

## 2024-07-29 HISTORY — DX: Flank pain, left side: R10.A2

## 2024-07-29 HISTORY — DX: Unspecified abdominal pain: R10.9

## 2024-07-29 HISTORY — PX: EXTRACORPOREAL SHOCK WAVE LITHOTRIPSY: SHX1557

## 2024-07-29 HISTORY — DX: Presence of dental prosthetic device (complete) (partial): Z97.2

## 2024-07-29 HISTORY — DX: Presence of spectacles and contact lenses: Z97.3

## 2024-07-29 HISTORY — DX: Mixed hyperlipidemia: E78.2

## 2024-07-29 HISTORY — DX: Type 2 diabetes mellitus without complications: E11.9

## 2024-07-29 SURGERY — LITHOTRIPSY, ESWL
Anesthesia: LOCAL | Laterality: Left

## 2024-07-29 MED ORDER — DIPHENHYDRAMINE HCL 25 MG PO CAPS
25.0000 mg | ORAL_CAPSULE | ORAL | Status: AC
  Administered 2024-07-29: 25 mg via ORAL
  Filled 2024-07-29: qty 1

## 2024-07-29 MED ORDER — SODIUM CHLORIDE 0.9 % IV SOLN: INTRAVENOUS | Status: DC

## 2024-07-29 MED ORDER — DIAZEPAM 5 MG PO TABS
10.0000 mg | ORAL_TABLET | ORAL | Status: AC
  Administered 2024-07-29: 10 mg via ORAL
  Filled 2024-07-29: qty 2

## 2024-07-29 MED ORDER — CIPROFLOXACIN HCL 500 MG PO TABS
500.0000 mg | ORAL_TABLET | ORAL | Status: AC
  Administered 2024-07-29: 500 mg via ORAL
  Filled 2024-07-29: qty 1

## 2024-07-29 NOTE — Telephone Encounter (Signed)
 Copied from CRM #[id number]. Topic: Clinical - Medical Advice >> Jul 28, 2024  4:33 PM Winona R wrote: Pt has an appointment sch for tomorrow to break up the kidney stones at [facility].

## 2024-07-29 NOTE — Telephone Encounter (Signed)
 Referral redirected to [facility] in [place] as Patient requested.

## 2024-07-29 NOTE — Discharge Instructions (Signed)
 [facility]

## 2024-07-29 NOTE — Interval H&P Note (Signed)
 History and Physical Interval Note:  07/29/2024 3:47 PM  Ralph Marquez  has presented today for surgery, with the diagnosis of LEFT URETERAL STONE.  The various methods of treatment have been discussed with the patient and family. After consideration of risks, benefits and other options for treatment, the patient has consented to  Procedure(s): LITHOTRIPSY, ESWL (Left) as a surgical intervention.  The patient's history has been reviewed, patient examined, no change in status, stable for surgery.  I have reviewed the patient's chart and labs.  No fever or chills. He also denies any dysuria or gross hematuria. Hasn't seen a stone pass. AFVSS. Questions were answered to the patient's satisfaction.  Stone visible on KUB and flouro.    Donnice Brooks

## 2024-07-29 NOTE — Telephone Encounter (Signed)
 Noted

## 2024-07-29 NOTE — Op Note (Signed)
 4mm left proximal stone  Left ESWL - see [facility] full op note  Findings: Stone was well targeted.  Stone faded, but did not disappear. He may need a staged procedure should he fail to pass the stone with the fragments.  He tolerated the procedure well.

## 2024-07-31 ENCOUNTER — Other Ambulatory Visit: Payer: Self-pay

## 2024-07-31 ENCOUNTER — Observation Stay (HOSPITAL_BASED_OUTPATIENT_CLINIC_OR_DEPARTMENT_OTHER): Admitting: Certified Registered Nurse Anesthetist

## 2024-07-31 ENCOUNTER — Observation Stay (HOSPITAL_COMMUNITY)

## 2024-07-31 ENCOUNTER — Observation Stay (HOSPITAL_COMMUNITY)
Admission: EM | Admit: 2024-07-31 | Discharge: 2024-08-01 | Disposition: A | Discharge status: Home / Self Care | Attending: Internal Medicine | Admitting: Internal Medicine

## 2024-07-31 ENCOUNTER — Emergency Department (HOSPITAL_COMMUNITY)

## 2024-07-31 ENCOUNTER — Observation Stay (HOSPITAL_COMMUNITY): Admitting: Certified Registered Nurse Anesthetist

## 2024-07-31 ENCOUNTER — Encounter (HOSPITAL_COMMUNITY): Payer: Self-pay | Admitting: Emergency Medicine

## 2024-07-31 ENCOUNTER — Encounter (HOSPITAL_COMMUNITY)
Admission: EM | Disposition: A | Discharge status: Home / Self Care | Payer: Self-pay | Source: Home / Self Care | Attending: Emergency Medicine

## 2024-07-31 DIAGNOSIS — I10 Essential (primary) hypertension: Secondary | ICD-10-CM | POA: Diagnosis present

## 2024-07-31 DIAGNOSIS — N200 Calculus of kidney: Principal | ICD-10-CM

## 2024-07-31 DIAGNOSIS — K760 Fatty (change of) liver, not elsewhere classified: Secondary | ICD-10-CM | POA: Diagnosis not present

## 2024-07-31 DIAGNOSIS — E66811 Obesity, class 1: Secondary | ICD-10-CM | POA: Insufficient documentation

## 2024-07-31 DIAGNOSIS — N2889 Other specified disorders of kidney and ureter: Secondary | ICD-10-CM | POA: Insufficient documentation

## 2024-07-31 DIAGNOSIS — Z79899 Other long term (current) drug therapy: Secondary | ICD-10-CM | POA: Insufficient documentation

## 2024-07-31 DIAGNOSIS — K828 Other specified diseases of gallbladder: Secondary | ICD-10-CM | POA: Diagnosis not present

## 2024-07-31 DIAGNOSIS — Z87891 Personal history of nicotine dependence: Secondary | ICD-10-CM

## 2024-07-31 DIAGNOSIS — I129 Hypertensive chronic kidney disease with stage 1 through stage 4 chronic kidney disease, or unspecified chronic kidney disease: Secondary | ICD-10-CM | POA: Insufficient documentation

## 2024-07-31 DIAGNOSIS — R109 Unspecified abdominal pain: Secondary | ICD-10-CM | POA: Diagnosis not present

## 2024-07-31 DIAGNOSIS — N1831 Chronic kidney disease, stage 3a: Secondary | ICD-10-CM | POA: Insufficient documentation

## 2024-07-31 DIAGNOSIS — N201 Calculus of ureter: Secondary | ICD-10-CM | POA: Diagnosis not present

## 2024-07-31 DIAGNOSIS — Z6830 Body mass index (BMI) 30.0-30.9, adult: Secondary | ICD-10-CM | POA: Insufficient documentation

## 2024-07-31 DIAGNOSIS — N179 Acute kidney failure, unspecified: Secondary | ICD-10-CM | POA: Insufficient documentation

## 2024-07-31 DIAGNOSIS — N132 Hydronephrosis with renal and ureteral calculous obstruction: Secondary | ICD-10-CM | POA: Diagnosis not present

## 2024-07-31 DIAGNOSIS — Z7984 Long term (current) use of oral hypoglycemic drugs: Secondary | ICD-10-CM | POA: Insufficient documentation

## 2024-07-31 DIAGNOSIS — K219 Gastro-esophageal reflux disease without esophagitis: Secondary | ICD-10-CM | POA: Insufficient documentation

## 2024-07-31 DIAGNOSIS — N202 Calculus of kidney with calculus of ureter: Principal | ICD-10-CM | POA: Insufficient documentation

## 2024-07-31 DIAGNOSIS — E782 Mixed hyperlipidemia: Secondary | ICD-10-CM | POA: Insufficient documentation

## 2024-07-31 DIAGNOSIS — E1122 Type 2 diabetes mellitus with diabetic chronic kidney disease: Secondary | ICD-10-CM | POA: Diagnosis not present

## 2024-07-31 DIAGNOSIS — E119 Type 2 diabetes mellitus without complications: Secondary | ICD-10-CM | POA: Diagnosis not present

## 2024-07-31 DIAGNOSIS — N189 Chronic kidney disease, unspecified: Secondary | ICD-10-CM | POA: Diagnosis present

## 2024-07-31 HISTORY — PX: CYSTOSCOPY/URETEROSCOPY/HOLMIUM LASER/STENT PLACEMENT: SHX6546

## 2024-07-31 LAB — GLUCOSE, CAPILLARY
Glucose-Capillary: 131 mg/dL — ABNORMAL HIGH (ref 70–99)
Glucose-Capillary: 216 mg/dL — ABNORMAL HIGH (ref 70–99)
Glucose-Capillary: 174 mg/dL — ABNORMAL HIGH (ref 70–99)

## 2024-07-31 LAB — CBC
HCT: 44.4 % (ref 39.0–52.0)
Hemoglobin: 13.8 g/dL (ref 13.0–17.0)
MCH: 28.5 pg (ref 26.0–34.0)
MCHC: 31.1 g/dL (ref 30.0–36.0)
MCV: 91.5 fL (ref 80.0–100.0)
Platelets: 352 K/uL (ref 150–400)
RBC: 4.85 MIL/uL (ref 4.22–5.81)
RDW: 14 % (ref 11.5–15.5)
WBC: 14.3 K/uL — ABNORMAL HIGH (ref 4.0–10.5)
nRBC: 0 % (ref 0.0–0.2)

## 2024-07-31 LAB — URINALYSIS, ROUTINE W REFLEX MICROSCOPIC
Bacteria, UA: NONE SEEN
Bilirubin Urine: NEGATIVE
Glucose, UA: >=500 mg/dL — AB
Ketones, ur: NEGATIVE mg/dL
Leukocytes,Ua: NEGATIVE
Nitrite: NEGATIVE
Protein, ur: NEGATIVE mg/dL
Specific Gravity, Urine: 1.017 (ref 1.005–1.030)
pH: 5 (ref 5.0–8.0)

## 2024-07-31 LAB — BASIC METABOLIC PANEL WITH GFR
Anion gap: 12 (ref 5–15)
BUN: 41 mg/dL — ABNORMAL HIGH (ref 8–23)
CO2: 18 mmol/L — ABNORMAL LOW (ref 22–32)
Calcium: 8.9 mg/dL (ref 8.9–10.3)
Chloride: 105 mmol/L (ref 98–111)
Creatinine, Ser: 2.24 mg/dL — ABNORMAL HIGH (ref 0.61–1.24)
GFR, Estimated: 31 mL/min — ABNORMAL LOW (ref >60)
Glucose, Bld: 173 mg/dL — ABNORMAL HIGH (ref 70–99)
Potassium: 4 mmol/L (ref 3.5–5.1)
Sodium: 135 mmol/L (ref 135–145)

## 2024-07-31 SURGERY — CYSTOSCOPY/URETEROSCOPY/HOLMIUM LASER/STENT PLACEMENT
Anesthesia: General | Site: Ureter | Laterality: Left

## 2024-07-31 MED ORDER — ONDANSETRON HCL 4 MG/2ML IJ SOLN
INTRAMUSCULAR | Status: DC | PRN
Start: 2024-07-31 — End: 2024-07-31
  Administered 2024-07-31: 4 mg via INTRAVENOUS

## 2024-07-31 MED ORDER — ONDANSETRON HCL 4 MG/2ML IJ SOLN
4.0000 mg | Freq: Once | INTRAMUSCULAR | Status: AC
  Administered 2024-07-31: 4 mg via INTRAVENOUS
  Filled 2024-07-31: qty 2

## 2024-07-31 MED ORDER — CEFAZOLIN SODIUM-DEXTROSE 2-4 GM/100ML-% IV SOLN
INTRAVENOUS | Status: AC
  Filled 2024-07-31: qty 100

## 2024-07-31 MED ORDER — ONDANSETRON HCL 4 MG/2ML IJ SOLN: 4.0000 mg | Freq: Four times a day (QID) | INTRAMUSCULAR | Status: DC | PRN

## 2024-07-31 MED ORDER — ACETAMINOPHEN 325 MG PO TABS
650.0000 mg | ORAL_TABLET | Freq: Four times a day (QID) | ORAL | Status: DC | PRN
  Administered 2024-08-01: 650 mg via ORAL
  Filled 2024-07-31: qty 2

## 2024-07-31 MED ORDER — INSULIN ASPART 100 UNIT/ML IJ SOLN
0.0000 [IU] | Freq: Three times a day (TID) | INTRAMUSCULAR | Status: DC
  Administered 2024-07-31: 5 [IU] via SUBCUTANEOUS
  Administered 2024-08-01: 3 [IU] via SUBCUTANEOUS

## 2024-07-31 MED ORDER — FENTANYL CITRATE (PF) 100 MCG/2ML IJ SOLN
INTRAMUSCULAR | Status: AC
  Filled 2024-07-31: qty 2

## 2024-07-31 MED ORDER — SODIUM CHLORIDE 0.9 % IV BOLUS
2000.0000 mL | Freq: Once | INTRAVENOUS | Status: AC
  Administered 2024-07-31: 2000 mL via INTRAVENOUS

## 2024-07-31 MED ORDER — FENTANYL CITRATE PF 50 MCG/ML IJ SOSY: 25.0000 ug | PREFILLED_SYRINGE | INTRAMUSCULAR | Status: DC | PRN

## 2024-07-31 MED ORDER — PRAVASTATIN SODIUM 40 MG PO TABS
20.0000 mg | ORAL_TABLET | Freq: Every day | ORAL | Status: DC
  Administered 2024-07-31: 20 mg via ORAL
  Filled 2024-07-31: qty 1

## 2024-07-31 MED ORDER — FENTANYL CITRATE (PF) 100 MCG/2ML IJ SOLN
INTRAMUSCULAR | Status: DC | PRN
  Administered 2024-07-31 (×4): 50 ug via INTRAVENOUS

## 2024-07-31 MED ORDER — PANTOPRAZOLE SODIUM 40 MG PO TBEC
40.0000 mg | DELAYED_RELEASE_TABLET | Freq: Two times a day (BID) | ORAL | Status: DC
  Administered 2024-07-31 – 2024-08-01 (×2): 40 mg via ORAL
  Filled 2024-07-31 (×2): qty 1

## 2024-07-31 MED ORDER — CEFAZOLIN SODIUM-DEXTROSE 2-3 GM-%(50ML) IV SOLR
INTRAVENOUS | Status: DC | PRN
  Administered 2024-07-31: 2 g via INTRAVENOUS

## 2024-07-31 MED ORDER — OXYCODONE HCL 5 MG PO TABS
5.0000 mg | ORAL_TABLET | ORAL | Status: DC | PRN
  Administered 2024-07-31 – 2024-08-01 (×2): 5 mg via ORAL
  Filled 2024-07-31 (×3): qty 1

## 2024-07-31 MED ORDER — OXYCODONE HCL 5 MG PO TABS
5.0000 mg | ORAL_TABLET | Freq: Once | ORAL | Status: AC | PRN
  Administered 2024-07-31: 5 mg via ORAL

## 2024-07-31 MED ORDER — OXYCODONE HCL 5 MG PO TABS
ORAL_TABLET | ORAL | Status: AC
  Filled 2024-07-31: qty 1

## 2024-07-31 MED ORDER — ACETAMINOPHEN 650 MG RE SUPP: 650.0000 mg | Freq: Four times a day (QID) | RECTAL | Status: DC | PRN

## 2024-07-31 MED ORDER — ACETAMINOPHEN 10 MG/ML IV SOLN: 1000.0000 mg | Freq: Once | INTRAVENOUS | Status: DC | PRN

## 2024-07-31 MED ORDER — LIDOCAINE HCL (CARDIAC) PF 100 MG/5ML IV SOSY
PREFILLED_SYRINGE | INTRAVENOUS | Status: DC | PRN
  Administered 2024-07-31: 60 mg via INTRATRACHEAL

## 2024-07-31 MED ORDER — OXYCODONE HCL 5 MG/5ML PO SOLN: 5.0000 mg | Freq: Once | ORAL | Status: AC | PRN

## 2024-07-31 MED ORDER — SUCCINYLCHOLINE CHLORIDE 200 MG/10ML IV SOSY
PREFILLED_SYRINGE | INTRAVENOUS | Status: AC
  Filled 2024-07-31: qty 10

## 2024-07-31 MED ORDER — PROPOFOL 10 MG/ML IV BOLUS
INTRAVENOUS | Status: DC | PRN
  Administered 2024-07-31: 160 mg via INTRAVENOUS

## 2024-07-31 MED ORDER — SODIUM CHLORIDE 0.9 % IR SOLN
Status: DC | PRN
  Administered 2024-07-31: 3000 mL via INTRAVESICAL

## 2024-07-31 MED ORDER — TRIAZOLAM 0.25 MG PO TABS
0.2500 mg | ORAL_TABLET | Freq: Every evening | ORAL | Status: DC | PRN
  Administered 2024-07-31: 0.25 mg via ORAL
  Filled 2024-07-31: qty 1

## 2024-07-31 MED ORDER — ACETAMINOPHEN 10 MG/ML IV SOLN
INTRAVENOUS | Status: AC
  Filled 2024-07-31: qty 100

## 2024-07-31 MED ORDER — DEXAMETHASONE SODIUM PHOSPHATE 4 MG/ML IJ SOLN
INTRAMUSCULAR | Status: DC | PRN
  Administered 2024-07-31: 5 mg via INTRAVENOUS

## 2024-07-31 MED ORDER — SODIUM CHLORIDE 0.9 % IV SOLN: INTRAVENOUS | Status: AC

## 2024-07-31 MED ORDER — SUCCINYLCHOLINE CHLORIDE 200 MG/10ML IV SOSY
PREFILLED_SYRINGE | INTRAVENOUS | Status: DC | PRN
  Administered 2024-07-31: 160 mg via INTRAVENOUS

## 2024-07-31 MED ORDER — HYDROMORPHONE HCL 1 MG/ML IJ SOLN
1.0000 mg | Freq: Once | INTRAMUSCULAR | Status: AC
  Administered 2024-07-31: 1 mg via INTRAVENOUS
  Filled 2024-07-31: qty 1

## 2024-07-31 MED ORDER — DEXAMETHASONE SODIUM PHOSPHATE 10 MG/ML IJ SOLN
INTRAMUSCULAR | Status: AC
Start: 2024-07-31 — End: 2024-07-31
  Filled 2024-07-31: qty 1

## 2024-07-31 MED ORDER — TAMSULOSIN HCL 0.4 MG PO CAPS
0.4000 mg | ORAL_CAPSULE | Freq: Every day | ORAL | Status: DC
  Administered 2024-07-31 – 2024-08-01 (×2): 0.4 mg via ORAL
  Filled 2024-07-31 (×2): qty 1

## 2024-07-31 MED ORDER — FENTANYL CITRATE PF 50 MCG/ML IJ SOSY
50.0000 ug | PREFILLED_SYRINGE | Freq: Once | INTRAMUSCULAR | Status: AC
  Administered 2024-07-31: 50 ug via INTRAVENOUS
  Filled 2024-07-31: qty 1

## 2024-07-31 MED ORDER — ACETAMINOPHEN 10 MG/ML IV SOLN
INTRAVENOUS | Status: DC | PRN
  Administered 2024-07-31: 1000 mg via INTRAVENOUS

## 2024-07-31 MED ORDER — ONDANSETRON HCL 4 MG PO TABS: 4.0000 mg | ORAL_TABLET | Freq: Four times a day (QID) | ORAL | Status: DC | PRN

## 2024-07-31 MED ORDER — LIDOCAINE HCL (PF) 2 % IJ SOLN
INTRAMUSCULAR | Status: AC
  Filled 2024-07-31: qty 5

## 2024-07-31 MED ORDER — LACTATED RINGERS IV SOLN: INTRAVENOUS | Status: DC | PRN

## 2024-07-31 MED ORDER — EMPAGLIFLOZIN 10 MG PO TABS: 10.0000 mg | ORAL_TABLET | Freq: Every day | ORAL | Status: DC

## 2024-07-31 MED ORDER — DILTIAZEM HCL ER COATED BEADS 240 MG PO CP24
360.0000 mg | ORAL_CAPSULE | Freq: Every day | ORAL | Status: DC
  Administered 2024-07-31 – 2024-08-01 (×2): 360 mg via ORAL
  Filled 2024-07-31 (×2): qty 1

## 2024-07-31 SURGICAL SUPPLY — 19 items
BAG URO CATCHER STRL LF (MISCELLANEOUS) ×1 IMPLANT
BASKET ZERO TIP NITINOL 2.4FR (BASKET) IMPLANT
CATH URETL OPEN 5X70 (CATHETERS) IMPLANT
CLOTH BEACON ORANGE TIMEOUT ST (SAFETY) ×1 IMPLANT
EXTRACTOR STONE 1.7FRX115CM (UROLOGICAL SUPPLIES) IMPLANT
FIBER LASER MOSES 200 DFL (Laser) IMPLANT
FIBER LASER MOSES 365 DFL (Laser) IMPLANT
GLOVE BIO SURGEON STRL SZ8 (GLOVE) ×1 IMPLANT
GOWN STRL REUS W/ TWL XL LVL3 (GOWN DISPOSABLE) ×1 IMPLANT
GUIDEWIRE ANG ZIPWIRE 038X150 (WIRE) IMPLANT
GUIDEWIRE STR DUAL SENSOR (WIRE) ×1 IMPLANT
KIT TURNOVER KIT A (KITS) ×1 IMPLANT
MANIFOLD NEPTUNE II (INSTRUMENTS) ×1 IMPLANT
PACK CYSTO (CUSTOM PROCEDURE TRAY) ×1 IMPLANT
SHEATH NAVIGATOR HD 11/13X28 (SHEATH) IMPLANT
SHEATH NAVIGATOR HD 11/13X36 (SHEATH) ×1 IMPLANT
STENT URET 6FRX26 CONTOUR (STENTS) IMPLANT
TUBING CONNECTING 10 (TUBING) ×1 IMPLANT
TUBING UROLOGY SET (TUBING) ×1 IMPLANT

## 2024-07-31 NOTE — ED Provider Notes (Addendum)
 Physical Exam  BP 112/84 (BP Location: Right Arm)   Pulse 91   Temp 98.6 F (37 C) (Oral)   Resp 20   Ht 5' 10 (1.778 m)   Wt 97.1 kg   SpO2 91%   BMI 30.71 kg/m   Physical Exam  Procedures  Procedures  ED Course / MDM    Medical Decision Making Amount and/or Complexity of Data Reviewed Labs: ordered. Radiology: ordered.  Risk Prescription drug management. Decision regarding hospitalization.   Medical Decision Making:   Ralph Marquez is a 68 y.o. male who presented to the ED today and seen by previous provider, K.Keith, PA-C, with primary complaint of left flank pain and nausea, detailed previous provider note.   Had previous lithotripsy completed by urology 2 days prior, presented with frank hematuria that had cleared since procedure, though presents today with increased flank pain and is taking his home Percocet without relief in his pain.  At time of care handoff, pending labs, imaging and then consultation with urology for possible new procedures.  Urology has been consulted prior to care handoff, and disposition pending results of workup.   Initial Study Results:   Laboratory  All laboratory results reviewed without evidence of clinically relevant pathology.   Exceptions include: Creatinine is elevated to 2.24 with decrease in GFR to 31.  Radiology:  All images reviewed independently. Agree with radiology report at this time.   CT Renal Stone Study Result Date: 07/31/2024 CLINICAL DATA:  Abdominal/flank pain EXAM: CT ABDOMEN AND PELVIS WITHOUT CONTRAST TECHNIQUE: Multidetector CT imaging of the abdomen and pelvis was performed following the standard protocol without IV contrast. RADIATION DOSE REDUCTION: This exam was performed according to the departmental dose-optimization program which includes automated exposure control, adjustment of the mA and/or kV according to patient size and/or use of iterative reconstruction technique. COMPARISON:  Prior CT scan of the  abdomen and pelvis 07/21/2024; 09/07/2015 FINDINGS: Lower chest: No acute abnormality. Hepatobiliary: Low attenuation of the hepatic parenchyma consistent with steatosis. Focal soft tissue nodule at the fundus of the gallbladder measures 1.6 x 1.5 cm. Similar findings have been present on prior images dating back to at least 2016. 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 Stability over nearly 10 years is consistent with a benign process. No stones or biliary ductal dilatation. Pancreas: Unremarkable. No pancreatic ductal dilatation or surrounding inflammatory changes. Spleen: Normal in size without focal abnormality. Adrenals/Urinary Tract: Mild left hydroureteronephrosis with a 4 mm stone in the mid ureter with at least 3 additional punctate stone fragments adjacent. There is associated periureteral stranding. Additional nonobstructing nephrolithiasis identified bilaterally. 4 mm stone in the right interpolar collecting system and punctate 1-2 mm stones in both lower pole collecting systems. Stomach/Bowel: Stomach is within normal limits. Appendix appears normal. No evidence of bowel wall thickening, distention, or inflammatory changes. Vascular/Lymphatic: Limited evaluation in the absence of intravenous contrast. No aneurysm. Extensive calcified atherosclerotic plaque. No suspicious lymphadenopathy. Reproductive: Prostate is unremarkable. Other: No abdominal wall hernia or abnormality. No abdominopelvic ascites. Musculoskeletal: No acute fracture or aggressive appearing lytic or blastic osseous lesion. Mild grade 1 anterolisthesis of L4 on L5. L5-S1 degenerative disc disease. Extensive facet arthropathy bilaterally throughout the lower lumbar spine. IMPRESSION: 1. Positive for obstructing stones in the left mid ureter resulting in left hydro ureterectasis with perinephric and periureteral edema. The dominant stone measures approximately 4 mm and there are at least 3 adjacent punctate  stone fragments. 2. Additional bilateral nonobstructing nephrolithiasis. 3.  Hepatic steatosis. 4.  Aortic Atherosclerosis (ICD10-170.0) 5. Lower lumbar degenerative disc disease and facet arthropathy. 6. Additional ancillary findings as above. Electronically Signed   By: Wilkie Lent M.D.   On: 07/31/2024 07:27   DG Abd 1 View Result Date: 07/29/2024 CLINICAL DATA:  Hematuria left lower quadrant pain. EXAM: ABDOMEN - 1 VIEW COMPARISON:  July 28, 2024 FINDINGS: The bowel gas pattern is normal. A round 5 mm opacity is again seen overlying the medial aspect of the left lower quadrant. This is unchanged in location when compared to the prior study. IMPRESSION: Stable 5 mm distal left ureteral calculus. Electronically Signed   By: Suzen Dials M.D.   On: 07/29/2024 17:39   DG HIP UNILAT W OR W/O PELVIS 2-3 VIEWS LEFT Result Date: 07/29/2024 CLINICAL DATA:  Left hip and groin pain, progressive. EXAM: DG HIP (WITH OR WITHOUT PELVIS) 2-3V LEFT COMPARISON:  Subsequent abdominopelvic CT, available at time of radiograph interpretation. FINDINGS: Moderate left hip joint space narrowing. Mild lateral acetabular spurring. No evidence of fracture, erosion, or suspicious bone lesion. Bony pelvis including pubic rami are intact. Minimal degenerative change of the pubic symphysis and sacroiliac joints. Left pelvic phleboliths. 5 mm calcification to the left of L4. IMPRESSION: 1. Moderate left hip osteoarthritis. 2. A 5 mm calcification to the left of L4, corresponding to ureteral calculus on subsequent CT. Electronically Signed   By: Andrea Gasman M.D.   On: 07/29/2024 10:19   CT ABDOMEN PELVIS WO CONTRAST Result Date: 07/21/2024 EXAM: CT ABDOMEN AND PELVIS WITHOUT CONTRAST 07/21/2024 10:17:50 AM TECHNIQUE: CT of the abdomen and pelvis was performed without the administration of intravenous contrast. Multiplanar reformatted images are provided for review. Automated exposure control, iterative reconstruction,  and/or weight based adjustment of the mA/kV was utilized to reduce the radiation dose to as low as reasonably achievable. COMPARISON: 09/07/2015 CLINICAL HISTORY: Abdominal/flank pain, stone suspected. Left lower quadrant abdominal pain. FINDINGS: LOWER CHEST: No acute abnormality. LIVER: Marked hepatic steatosis. Vague area of relative hyperattenuation in the posterior aspect of segment 4 including the 19/2 is similar on the prior, consistent with sparing of steatosis. GALLBLADDER AND BILE DUCTS: Gallbladder is unremarkable. No biliary ductal dilatation. SPLEEN: No acute abnormality. 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 ADRENAL GLANDS: Mild left adrenal thickening. KIDNEYS, URETERS AND BLADDER: Bilateral punctate renal collecting system calculi. Interpolar right renal 2.0 cm fluid density lesion is likely a cyst. Other too small to characterize renal lesions are also likely cysts. These do not warrant specific imaging follow up per consensus criteria. Although there is no significant hydronephrosis, a proximal to mid left ureteric stone measures 5 mm on coronal image 78 and transverse image 45. Urinary bladder is unremarkable. GI AND BOWEL: Scattered colonic diverticula. Normal terminal ileum and appendix. Normal stomach and small bowel. PERITONEUM AND RETROPERITONEUM: No ascites. No free air. VASCULATURE: Abdominal aortic atherosclerosis. LYMPH NODES: No lymphadenopathy. REPRODUCTIVE ORGANS: No acute abnormality. BONES AND SOFT TISSUES: Small fat-containing ventral abdominal wall hernia. Lumbosacral spondylosis. Degenerative disc disease at the lumbosacral junction. IMPRESSION: 1. Proximal to mid left ureteric stone measuring 5 mm without significant hydronephrosis. 2. Bilateral punctate renal collecting system calculi. 3. Marked hepatic steatosis 4. 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 Per consensus criteria, this warrants non-emergent follow up with pre and  post-contrast abdominal MRI/MRCP. 5. Aortic atherosclerosis ([id number]) Electronically signed by: Rockey Kilts MD 07/21/2024 10:44 AM EDT RP Workstation: [id number]      Consults: Case  discussed with Dr. Shane with urology.   Reassessment and Plan:   After discussion with urology, they will be admitting this patient with plans to admit the medicine is secondary to AKI.  While here in the ED patient had increased pain, he was provided with a dose of hydromorphone  to manage pain.  Otherwise continuing care as previously noted, he is to be moved to PACU with anticipation of surgical intervention to manage this patient's nephrolithiasis.         Myriam Dorn BROCKS, PA 07/31/24 [phone]    Myriam Dorn BROCKS, PA 07/31/24 [id number]    Charlyn Sora, MD 07/31/24 [phone]

## 2024-07-31 NOTE — Plan of Care (Signed)

## 2024-07-31 NOTE — Anesthesia Procedure Notes (Signed)
 Procedure Name: Intubation Date/Time: 07/31/2024 9:40 AM  Performed by: Judythe Tanda Aran, CRNAPre-anesthesia Checklist: Patient identified, Emergency Drugs available, Suction available and Patient being monitored Patient Re-evaluated:Patient Re-evaluated prior to induction Oxygen Delivery Method: Circle system utilized Preoxygenation: Pre-oxygenation with 100% oxygen Induction Type: IV induction Ventilation: Mask ventilation without difficulty Laryngoscope Size: Miller and 3 Grade View: Grade I Tube type: Oral Tube size: 7.5 mm Number of attempts: 1 Airway Equipment and Method: Stylet Placement Confirmation: ETT inserted through vocal cords under direct vision, positive ETCO2 and breath sounds checked- equal and bilateral Secured at: 23 cm Tube secured with: Tape Dental Injury: Teeth and Oropharynx as per pre-operative assessment

## 2024-07-31 NOTE — ED Notes (Signed)
 RN gave report to OR patient being transported to PACU

## 2024-07-31 NOTE — Discharge Instructions (Addendum)
 DISCHARGE INSTRUCTIONS FOR Stent  MEDICATIONS:  1.  Hydrocodone  2. Tamsulosin   3. Robaxin 4. Hyosciamine   ACTIVITY:  1. No strenuous activity x 1week  2. No driving while on narcotic pain medications  3. Drink plenty of water   4. Continue to walk at home - it is normal to see blood in the urine while the stent is in place, so keep active, but don't over do it.  5. May return to work/school tomorrow or when you feel ready  6. You may experience some pain when urinating in the kidney on the side that was operated on while the stent is in place this is normal  BATHING:  1. You can shower and we recommend daily showers    SIGNS/SYMPTOMS TO CALL:  Please call us  if you have a fever greater than 101.5, uncontrolled nausea/vomiting, uncontrolled pain, dizziness, unable to urinate, bloody urine with clots greater than the size of a quarter, chest pain, shortness of breath, leg swelling, leg pain, redness around wound, drainage from wound, or any other concerns or questions.   You can reach us  at [phone].   FOLLOW-UP:  1. Follow-up as planned AUGUST 28 at 1:45 PM for xray and visit with NP DAVIS - be sure to follow-up to plan stent/stone removal

## 2024-07-31 NOTE — ED Triage Notes (Signed)
 Pt to ED from home c/o left flank pain.  States had lithotripsy done yesterday for left kidney stone.  States able to urinate.

## 2024-07-31 NOTE — Consult Note (Signed)
 I have been asked to see the patient by Dr. Ileana Eck, for evaluation and management of left ureteral stone.  History of present illness: See several gentleman with a history of nephrolithiasis in the left side status post ESWL on 07/29/2024.  Patient started having significant left flank pain overnight and developed nausea and vomiting.  He denies any fevers or chills.  He also saw blood in his urine.  He came to the ER today unable to tolerate the pain he also noted to have an AKI of 2.24.  UA shows no infection.  CT shows 2 mid ureteral stones.     Review of systems: A 12 point comprehensive review of systems was obtained and is negative unless otherwise stated in the history of present illness.  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   Current Outpatient Medications on File Prior to Encounter  Medication Sig Dispense Refill   celecoxib  (CELEBREX ) 50 MG capsule Take 1 capsule (50 mg total) by mouth 2 (two) times daily. 60 capsule 1   diclofenac Sodium (VOLTAREN) 1 % GEL Apply 4 g topically 4 (four) times daily as needed.     diltiazem  (CARDIZEM  CD) 360 MG 24 hr capsule Take 1 capsule (360 mg total) by mouth daily.  (Patient taking differently: Take 360 mg by mouth daily.) 90 capsule 3   empagliflozin  (JARDIANCE ) 10 MG TABS tablet Take 1 tablet (10 mg total) by mouth daily. (Patient taking differently: Take 10 mg by mouth daily.) 100 tablet 3   etodolac (LODINE) 500 MG tablet Take 1 tablet by mouth 2 (two) times daily.     ondansetron  (ZOFRAN ) 4 MG tablet Take 1 tablet (4 mg total) by mouth every 6 (six) hours as needed for nausea. 30 tablet 0   oxyCODONE  (ROXICODONE ) 5 MG immediate release tablet Take 1-2 tablets (5-10 mg total) by mouth every 4 (four) hours as needed for moderate pain (pain score 4-6) or severe pain (pain score 7-10). 40 tablet 0   pantoprazole  (PROTONIX ) 40 MG tablet Take 1 tablet (40 mg total) by mouth 2 (two) times daily before a meal. (Patient taking differently: Take 40 mg by mouth 2 (two) times daily before a meal.) 60 tablet 2   pravastatin  (PRAVACHOL ) 20 MG tablet Take 1 tablet (20 mg total) by mouth daily. (Patient taking differently: Take 20 mg by mouth at bedtime.) 90 tablet 3   spironolactone  (ALDACTONE ) 25 MG tablet Take 0.5 tablets (12.5 mg total) by mouth daily. (Patient taking differently: Take 12.5 mg by mouth daily.) 45 tablet 3   tamsulosin  (FLOMAX ) 0.4 MG CAPS capsule Take 1 capsule (0.4 mg total) by mouth daily. (Patient not taking: Reported on 07/28/2024) 5 capsule 0   triazolam  (HALCION ) 0.25 MG tablet Take 1 tablet (0.25 mg total) by mouth  at bedtime as needed for sleep. (Patient taking differently: Take 0.25 mg by mouth at bedtime as needed for sleep.) 30 tablet 2   Vitamin D , Ergocalciferol , (DRISDOL ) 1.25 MG (50000 UNIT) CAPS capsule Take 1 capsule (50,000 Units total) by mouth every 7 (seven) days. (Patient taking differently: Take 50,000 Units by mouth every 7 (seven) days. Thursday's) 12 capsule 1    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   s/p  08-25-2018  IR embolization cerebral coilling x3   CKD (chronic kidney  disease), stage III (HCC)    GERD (gastroesophageal reflux disease)    Hyperlipidemia, mixed    Hypertension    Left flank pain    Left ureteral calculus 07/21/2024   OA (osteoarthritis)    Psychophysiologic insomnia 01/29/2024   Renal calculi 07/21/2024   bilateral   Type 2 diabetes mellitus (HCC)    followed by pcp   Wears dentures    full upper   Wears glasses     Past Surgical History:  Procedure Laterality Date   CEREBRAL EMBOLIZATION  08/25/2018   @[facility];   x3  coiling superiorly directed Acomm aneurysm   ESOPHAGOGASTRODUODENOSCOPY N/A 07/30/2017   Procedure: ESOPHAGOGASTRODUODENOSCOPY (EGD) Possible esophageal dilation.;  Surgeon: Golda Claudis PENNER, MD;  Location: [facility];  Service: Endoscopy;  Laterality: N/A;   ROTATOR CUFF REPAIR W/ DISTAL CLAVICLE EXCISION  08/03/2003   @[facility]  by dr duda   TOTAL KNEE ARTHROPLASTY Left 02/09/2024   Procedure: TOTAL KNEE ARTHROPLASTY;  Surgeon: Edie Norleen PARAS, MD;  Location: [facility];  Service: Orthopedics;  Laterality: Left;   TOTAL KNEE ARTHROPLASTY Right 2016   @[facility]     Social History   Tobacco Use   Smoking status: Former    Types: Cigarettes   Smokeless tobacco: Never   Tobacco comments:    07-28-2024  pt quit smoking 2005  Vaping Use   Vaping status: Never Used  Substance Use Topics   Alcohol use: No   Drug use: Never    Family History  Problem Relation Age of Onset   CAD Mother        CABG in 38's   CVA Sister     PE: Vitals:   07/31/24 0551  BP: 112/84  Pulse: 91  Resp: 20  Temp: 98.6 F (37 C)  TempSrc: Oral  SpO2: 91%  Weight: 97.1 kg  Height: 5' 10 (1.778 m)   General: Moderate distress Respiratory: Normal work of breathing room air Cardiovascular: Regular rate and rhythm per monitor GU: No Foley in place left-sided flank pain  Recent Labs    07/31/24 0558  WBC 14.3*  HGB 13.8  HCT 44.4   Recent Labs    07/31/24 0558  NA 135  K 4.0  CL 105  CO2 18*  GLUCOSE 173*  BUN  41*  CREATININE 2.24*  CALCIUM 8.9   No results for input(s): LABPT, INR in the last 72 hours. No results for input(s): LABURIN in the last 72 hours. Results for orders placed or performed during the hospital encounter of 02/05/24  Surgical pcr screen     Status: None   Collection Time: 02/05/24 11:24 AM   Specimen: Nasal Mucosa; Nasal Swab  Result Value Ref Range Status   MRSA, PCR NEGATIVE NEGATIVE Final   Staphylococcus aureus NEGATIVE NEGATIVE Final    Comment: (NOTE) The Xpert SA Assay (FDA approved for NASAL specimens in patients 2 years of age and older), is one component  of a comprehensive surveillance program. It is not intended to diagnose infection nor to guide or monitor treatment. Performed at [facility], [address] [place], [place] [address]     Imaging: CT 8/24 IMPRESSION: 1. Positive for obstructing stones in the left mid ureter resulting in left hydro ureterectasis with perinephric and periureteral edema. The dominant stone measures approximately 4 mm and there are at least 3 adjacent punctate stone fragments. 2. Additional bilateral nonobstructing nephrolithiasis. 3. Hepatic steatosis. 4.  Aortic Atherosclerosis (ICD10-170.0) 5. Lower lumbar degenerative disc disease and facet arthropathy. 6. Additional ancillary findings as above.  Imp: 68 year old gentleman with history of left nephrolithiasis status post ESWL with Dr. Nieves on 07/29/2024 developed significant pain and AKI on 8/24.  Presented to the ER.  Patient has significant AKI no signs of infection.  We discussed risk benefits alternatives to ureteroscopy.  This included bleeding infection and damage to surrounding structures surrounding structures including ureter as well as urethra.  We discussed the need for stent postoperatively as well as the potential symptoms of stent placement.  We discussed possible inability to complete procedure due to caliber of your ureter or  inability to pass stone possibly requiring long-term stent versus nephrostomy tube.  We discussed need for possible second surgery.  Patient voiced their understanding and consent was obtained.    Recommendations: - Keep n.p.o. - Will plan for left ureteral stent versus ureteroscopy - Please admit to medicine to monitor kidney function.  Patient likely can be discharged tomorrow.  Thank you for involving me in this patient's care, I will continue to follow along.Please page with any further questions or concerns. Steffan JAYSON Pea

## 2024-07-31 NOTE — Anesthesia Preprocedure Evaluation (Addendum)
 Anesthesia Evaluation  Patient identified by MRN, date of birth, ID band Patient awake    Reviewed: Allergy & Precautions, NPO status , Patient's Chart, lab work & pertinent test results  History of Anesthesia Complications Negative for: history of anesthetic complications  Airway Mallampati: II  TM Distance: >3 FB Neck ROM: Full    Dental  (+) Edentulous Upper, Edentulous Lower, Dental Advisory Given   Pulmonary neg shortness of breath, neg sleep apnea, neg COPD, neg recent URI, former smoker   breath sounds clear to auscultation       Cardiovascular hypertension, Pt. on medications (-) angina (-) Past MI and (-) CHF  Rhythm:Regular  Ef 70% no wall motion abnormalities, otherwise normal tte 2018   Neuro/Psych negative neurological ROS     GI/Hepatic ,GERD  Medicated and Controlled,,  Endo/Other  diabetes, Type 2  Lab Results      Component                Value               Date                      HGBA1C                   6.6 (H)             02/05/2024             Renal/GU ARF and CRFRenal disease LEFT URETERAL STONE   Lab Results      Component                Value               Date                      NA                       135                 07/31/2024                K                        4.0                 07/31/2024                CO2                      18 (L)              07/31/2024                GLUCOSE                  173 (H)             07/31/2024                BUN                      41 (H)              07/31/2024                CREATININE  2.24 (H)            07/31/2024                CALCIUM                  8.9                 07/31/2024                EGFR                     58 (L)              02/29/2024                GFRNONAA                 31 (L)              07/31/2024                Musculoskeletal  (+) Arthritis ,    Abdominal   Peds  Hematology Lab  Results      Component                Value               Date                      WBC                      14.3 (H)            07/31/2024                HGB                      13.8                07/31/2024                HCT                      44.4                07/31/2024                MCV                      91.5                07/31/2024                PLT                      352                 07/31/2024              Anesthesia Other Findings   Reproductive/Obstetrics                              Anesthesia Physical Anesthesia Plan  ASA: 3  Anesthesia Plan: General   Post-op Pain Management: Minimal or no pain anticipated   Induction: Intravenous and Rapid sequence  PONV Risk Score and Plan: 2 and Ondansetron  and Dexamethasone   Airway Management Planned: Oral  ETT  Additional Equipment: None  Intra-op Plan:   Post-operative Plan: Extubation in OR  Informed Consent: I have reviewed the patients History and Physical, chart, labs and discussed the procedure including the risks, benefits and alternatives for the proposed anesthesia with the patient or authorized representative who has indicated his/her understanding and acceptance.     Dental advisory given  Plan Discussed with: CRNA  Anesthesia Plan Comments:          Anesthesia Quick Evaluation

## 2024-07-31 NOTE — ED Provider Notes (Signed)
 [place] EMERGENCY DEPARTMENT AT [facility] Provider Note   CSN: [id number] Arrival date & time: 07/31/24  9462     Patient presents with: Flank Pain   Ralph Marquez is a 68 y.o. male presents today for left flank pain and nausea.  Patient had lithotripsy done by Dr. Nieves on the 22nd.  Patient states that he initially had blood in his urine the first few times after the procedure but it has since cleared.  Patient denies dysuria, hematuria, urgency, frequency, vomiting, fever, chills, any other complaints at this time.  Patient reports he is taking Percocet without relief.    Flank Pain       Prior to Admission medications   Medication Sig Start Date End Date Taking? Authorizing Provider  celecoxib  (CELEBREX ) 50 MG capsule Take 1 capsule (50 mg total) by mouth 2 (two) times daily. 07/27/24   Glennon Sand, NP  diclofenac Sodium (VOLTAREN) 1 % GEL Apply 4 g topically 4 (four) times daily as needed.    [provider]  diltiazem  (CARDIZEM  CD) 360 MG 24 hr capsule Take 1 capsule (360 mg total) by mouth daily. Patient taking differently: Take 360 mg by mouth daily. 01/29/24   Melvenia Manus BRAVO, MD  empagliflozin  (JARDIANCE ) 10 MG TABS tablet Take 1 tablet (10 mg total) by mouth daily. Patient taking differently: Take 10 mg by mouth daily. 06/28/24   Bevely Doffing, FNP  etodolac (LODINE) 500 MG tablet Take 1 tablet by mouth 2 (two) times daily. 05/04/24   [provider]  ondansetron  (ZOFRAN ) 4 MG tablet Take 1 tablet (4 mg total) by mouth every 6 (six) hours as needed for nausea. 02/10/24   Kip Lynwood Double, PA-C  oxyCODONE  (ROXICODONE ) 5 MG immediate release tablet Take 1-2 tablets (5-10 mg total) by mouth every 4 (four) hours as needed for moderate pain (pain score 4-6) or severe pain (pain score 7-10). 02/10/24   Kip Lynwood Double, PA-C  pantoprazole  (PROTONIX ) 40 MG tablet Take 1 tablet (40 mg total) by mouth 2 (two) times daily before a  meal. Patient taking differently: Take 40 mg by mouth 2 (two) times daily before a meal. 02/05/24   Melvenia Manus BRAVO, MD  pravastatin  (PRAVACHOL ) 20 MG tablet Take 1 tablet (20 mg total) by mouth daily. Patient taking differently: Take 20 mg by mouth at bedtime. 05/20/24   Melvenia Manus BRAVO, MD  spironolactone  (ALDACTONE ) 25 MG tablet Take 0.5 tablets (12.5 mg total) by mouth daily. Patient taking differently: Take 12.5 mg by mouth daily. 01/29/24 01/23/25  Melvenia Manus BRAVO, MD  tamsulosin  (FLOMAX ) 0.4 MG CAPS capsule Take 1 capsule (0.4 mg total) by mouth daily. Patient not taking: Reported on 07/28/2024 07/27/24   Glennon Sand, NP  triazolam  (HALCION ) 0.25 MG tablet Take 1 tablet (0.25 mg total) by mouth at bedtime as needed for sleep. Patient taking differently: Take 0.25 mg by mouth at bedtime as needed for sleep. 07/25/24   Bevely Doffing, FNP  Vitamin D , Ergocalciferol , (DRISDOL ) 1.25 MG (50000 UNIT) CAPS capsule Take 1 capsule (50,000 Units total) by mouth every 7 (seven) days. Patient taking differently: Take 50,000 Units by mouth every 7 (seven) days. Thursday's 06/02/24   Bevely Doffing, FNP    Allergies: Amlodipine , Lisinopril, Losartan potassium, and Betadine [povidone iodine]    Review of Systems  Genitourinary:  Positive for flank pain.    Updated Vital Signs BP 112/84 (BP Location: Right Arm)   Pulse 91   Temp 98.6 F (37  C) (Oral)   Resp 20   Ht 5' 10 (1.778 m)   Wt 97.1 kg   SpO2 91%   BMI 30.71 kg/m   Physical Exam Vitals and nursing note reviewed.  Constitutional:      General: He is not in acute distress.    Appearance: He is well-developed.     Comments: Uncomfortable appearing  HENT:     Head: Normocephalic and atraumatic.  Eyes:     Conjunctiva/sclera: Conjunctivae normal.  Cardiovascular:     Rate and Rhythm: Normal rate and regular rhythm.     Heart sounds: No murmur heard. Pulmonary:     Effort: Pulmonary effort is normal. No respiratory distress.      Breath sounds: Normal breath sounds.  Abdominal:     Palpations: Abdomen is soft.     Tenderness: There is no abdominal tenderness. There is left CVA tenderness.  Musculoskeletal:        General: No swelling.     Cervical back: Neck supple.  Skin:    General: Skin is warm and dry.     Capillary Refill: Capillary refill takes less than 2 seconds.  Neurological:     General: No focal deficit present.     Mental Status: He is alert and oriented to person, place, and time.  Psychiatric:        Mood and Affect: Mood normal.     (all labs ordered are listed, but only abnormal results are displayed) Labs Reviewed  URINALYSIS, ROUTINE W REFLEX MICROSCOPIC  BASIC METABOLIC PANEL WITH GFR  CBC    EKG: None  Radiology: DG Abd 1 View Result Date: 07/29/2024 CLINICAL DATA:  Hematuria left lower quadrant pain. EXAM: ABDOMEN - 1 VIEW COMPARISON:  July 28, 2024 FINDINGS: The bowel gas pattern is normal. A round 5 mm opacity is again seen overlying the medial aspect of the left lower quadrant. This is unchanged in location when compared to the prior study. IMPRESSION: Stable 5 mm distal left ureteral calculus. Electronically Signed   By: Suzen Dials M.D.   On: 07/29/2024 17:39     Procedures   Medications Ordered in the ED  fentaNYL  (SUBLIMAZE ) injection 50 mcg (has no administration in time range)  ondansetron  (ZOFRAN ) injection 4 mg (has no administration in time range)                                    Medical Decision Making Amount and/or Complexity of Data Reviewed Labs: ordered.  Risk Prescription drug management.   This patient presents to the ED for concern of left flank pain differential diagnosis includes UTI, kidney stone, septic stone, pyelonephritis    Additional history obtained   Additional history obtained from Electronic Medical Record External records from outside source obtained and reviewed including surgical notes   Lab Tests:  I  Ordered, and personally interpreted labs.  The pertinent results include:  pending   Imaging Studies ordered:  I ordered imaging studies including CT renal stone study I independently visualized and interpreted imaging which showed pending I agree with the radiologist interpretation   Medicines ordered and prescription drug management:  I ordered medication including fentanyl  and Zofran     I have reviewed the patients home medicines and have made adjustments as needed  6:32 AM Care of Vashawn Thomaston transferred to PA Gilliam at the end of my shift as the patient will require reassessment once  labs/imaging have resulted. Patient presentation, ED course, and plan of care discussed with review of all pertinent labs and imaging. Please see his/her note for further details regarding further ED course and disposition. Plan at time of handoff is labs, imaging, urology consult which will determine dispo. This may be altered or completely changed at the discretion of the oncoming team pending results of further workup.        Final diagnoses:  None    ED Discharge Orders     None          Francis Ileana LOISE DEVONNA 07/31/24 9358    Trine Raynell Moder, MD 07/31/24 [phone]

## 2024-07-31 NOTE — H&P (Signed)
 History and Physical    Patient: Ralph Marquez FMW:[id number] DOB: October 03, 1956 DOA: 07/31/2024 DOS: the patient was seen and examined on 07/31/2024 PCP: Bevely Doffing, FNP  Patient coming from: Home  Chief Complaint:  Chief Complaint  Patient presents with   Flank Pain   HPI: Ralph Marquez is a 68 y.o. 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  Since yesterday, the hematuria has already cleared. He denied fever, chills, rhinorrhea, sore throat, wheezing or hemoptysis.  No chest pain, palpitations, diaphoresis, PND, orthopnea or pitting edema of the lower extremities.  No emesis, diarrhea, constipation, melena or hematochezia.  No polyuria, polydipsia, polyphagia or blurred vision.   Lab work: Urinalysis with greater than 500 glucose, large hemoglobin, 21-50 RBC and no bacteria.  CBC showed a white count of 14.3, hemoglobin 13.8 g/dL and platelets 647.  BMP CO2 18 mmol/L with a normal anion gap, the rest of the electrolytes were normal.  Glucose 173, BUN 41 and creatinine 2.24 mg/dL.  Imaging: CT abdomen/pelvis positive for obstructing stones in the left ureter resulting in the left hydroureteronephrosis with perinephric and periureteral edema.  Dominant stone measures 4 mm and there are 3 adjacent punctate stone fragments.  Additional bilateral nonobstructing nephrolithiasis.  Hepatic asteatosis.  Aortic atherosclerosis.  Lower lumbar degenerative disc disease and facet arthropathy.  ED course: Initial vital signs were temperature 98.6 F, pulse 91, respiration 20, BP  112/84 mmHg O2 sat 91% on room air.  The patient received fentanyl  50 mcg IVP, hydromorphone  1 mg IVP, ondansetron  4 mg IVP and while in PACU received oxycodone  5 mg p.o. x 2.  Review of Systems: As mentioned in the history of present illness. All other systems reviewed and are negative. 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   s/p  08-25-2018  IR embolization cerebral coilling x3   CKD (chronic kidney disease), stage III (HCC)    GERD (gastroesophageal reflux disease)    Hyperlipidemia, mixed    Hypertension    Left flank pain    Left ureteral calculus 07/21/2024   OA (osteoarthritis)    Psychophysiologic insomnia 01/29/2024   Renal calculi 07/21/2024   bilateral   Type 2 diabetes mellitus (HCC)    followed by pcp   Wears dentures    full upper   Wears glasses    Past Surgical History:  Procedure Laterality Date   CEREBRAL EMBOLIZATION  08/25/2018   @[facility];   x3  coiling superiorly directed Acomm aneurysm   ESOPHAGOGASTRODUODENOSCOPY N/A 07/30/2017   Procedure: ESOPHAGOGASTRODUODENOSCOPY (EGD) Possible esophageal dilation.;  Surgeon: Golda Claudis PENNER, MD;  Location: [facility];  Service: Endoscopy;  Laterality: N/A;   ROTATOR CUFF REPAIR W/ DISTAL CLAVICLE EXCISION  08/03/2003   @[facility]  by dr duda   TOTAL KNEE ARTHROPLASTY Left 02/09/2024   Procedure: TOTAL KNEE ARTHROPLASTY;  Surgeon: Edie Norleen PARAS, MD;  Location: [facility] ORS;  Service: Orthopedics;  Laterality: Left;   TOTAL KNEE ARTHROPLASTY Right 2016   @[facility]    Social History:  reports that he has quit smoking. His smoking use included cigarettes. He has never used smokeless  tobacco. He reports that he does not drink alcohol and does not use drugs.  Allergies  Allergen Reactions   Amlodipine  Hives   Lisinopril Hives   Losartan Potassium Hives   Betadine [Povidone Iodine] Rash    Family History  Problem Relation Age of Onset   CAD Mother        CABG in 12's    CVA Sister     Prior to Admission medications   Medication Sig Start Date End Date Taking? Authorizing Provider  celecoxib  (CELEBREX ) 50 MG capsule Take 1 capsule (50 mg total) by mouth 2 (two) times daily. 07/27/24   Glennon Sand, NP  diclofenac Sodium (VOLTAREN) 1 % GEL Apply 4 g topically 4 (four) times daily as needed.    [provider]  diltiazem  (CARDIZEM  CD) 360 MG 24 hr capsule Take 1 capsule (360 mg total) by mouth daily. Patient taking differently: Take 360 mg by mouth daily. 01/29/24   Melvenia Manus BRAVO, MD  empagliflozin  (JARDIANCE ) 10 MG TABS tablet Take 1 tablet (10 mg total) by mouth daily. Patient taking differently: Take 10 mg by mouth daily. 06/28/24   Bevely Doffing, FNP  etodolac (LODINE) 500 MG tablet Take 1 tablet by mouth 2 (two) times daily. 05/04/24   [provider]  ondansetron  (ZOFRAN ) 4 MG tablet Take 1 tablet (4 mg total) by mouth every 6 (six) hours as needed for nausea. 02/10/24   Kip Lynwood Double, PA-C  oxyCODONE  (ROXICODONE ) 5 MG immediate release tablet Take 1-2 tablets (5-10 mg total) by mouth every 4 (four) hours as needed for moderate pain (pain score 4-6) or severe pain (pain score 7-10). 02/10/24   Kip Lynwood Double, PA-C  pantoprazole  (PROTONIX ) 40 MG tablet Take 1 tablet (40 mg total) by mouth 2 (two) times daily before a meal. Patient taking differently: Take 40 mg by mouth 2 (two) times daily before a meal. 02/05/24   Melvenia Manus BRAVO, MD  pravastatin  (PRAVACHOL ) 20 MG tablet Take 1 tablet (20 mg total) by mouth daily. Patient taking differently: Take 20 mg by mouth at bedtime. 05/20/24   Melvenia Manus BRAVO, MD  spironolactone  (ALDACTONE ) 25 MG tablet Take 0.5 tablets (12.5 mg total) by mouth daily. Patient taking differently: Take 12.5 mg by mouth daily. 01/29/24 01/23/25  Melvenia Manus BRAVO, MD  tamsulosin  (FLOMAX ) 0.4 MG CAPS capsule Take 1 capsule (0.4 mg total) by mouth daily. Patient not taking: Reported on 07/28/2024 07/27/24   Glennon Sand, NP  triazolam  (HALCION ) 0.25 MG tablet Take 1 tablet (0.25 mg total) by mouth at bedtime as needed for sleep. Patient taking differently: Take 0.25 mg by mouth at bedtime as needed for sleep. 07/25/24   Bevely Doffing, FNP  Vitamin D , Ergocalciferol , (DRISDOL ) 1.25 MG (50000 UNIT) CAPS capsule Take 1 capsule (50,000 Units total) by mouth every 7 (seven) days. Patient taking differently: Take 50,000 Units by mouth every 7 (seven) days. Thursday's 06/02/24   Bevely Doffing, FNP    Physical Exam: Vitals:   07/31/24 0551  BP: 112/84  Pulse: 91  Resp: 20  Temp: 98.6 F (37 C)  TempSrc: Oral  SpO2: 91%  Weight: 97.1 kg  Height: 5' 10 (1.778 m)   Physical Exam Vitals and nursing note reviewed.  Constitutional:      General: He is awake. He is not in acute distress.    Appearance: Normal appearance. He is ill-appearing.  HENT:     Head: Normocephalic.     Nose: No rhinorrhea.  Mouth/Throat:     Mouth: Mucous membranes are moist.  Eyes:     General: No scleral icterus.    Pupils: Pupils are equal, round, and reactive to light.  Neck:     Vascular: No JVD.  Cardiovascular:     Rate and Rhythm: Normal rate and regular rhythm.     Heart sounds: S1 normal and S2 normal.  Pulmonary:     Effort: Pulmonary effort is normal.     Breath sounds: Normal breath sounds. No wheezing, rhonchi or rales.  Abdominal:     General: Bowel sounds are normal. There is no distension.     Palpations: Abdomen is soft.     Tenderness: There is no abdominal tenderness. There is left CVA tenderness. There is no right CVA tenderness.  Musculoskeletal:     Cervical back: Neck supple.     Right lower leg: No edema.     Left lower leg: No edema.  Skin:    General: Skin is warm and dry.  Neurological:     General: No focal deficit present.     Mental Status: He is alert and oriented to person, place, and time.  Psychiatric:        Mood and Affect: Mood normal.        Behavior: Behavior normal.  Behavior is cooperative.     Data Reviewed:  Results are pending, will review when available.  Assessment and Plan: Principal Problem:   AKI (acute kidney injury) (HCC) Superimposed on:   Chronic kidney disease, stage 3a (HCC) In the setting of:   Nephrolithiasis Observation/telemetry. Continue IV fluids. Hold Jardiance  and spironolactone . Avoid hypotension. Avoid nephrotoxins. Monitor intake and output. Monitor renal function electrolytes.  Active Problems:   Essential hypertension Continue Cardizem  360 mg p.o. daily. Hold the spironolactone .    Mixed hyperlipidemia On pravastatin  20 mg p.o. daily.    GERD (gastroesophageal reflux disease) Continue pantoprazole  40 mg p.o. twice daily.    Type 2 diabetes mellitus with  diabetic chronic kidney disease (HCC) Carbohydrate modified diet. Hold Jardiance . CBG monitoring with [facility] while in the hospital. Check hemoglobin A1c.    Hepatic steatosis No significant EtOH use. Check LFTs in AM. Weight loss will be beneficial. Follow-up with primary care provider.    Class 1 obesity Current BMI 30.71 kg/m. Would benefit from lifestyle modifications. Follow-up closely with PCP as an outpatient.     Advance Care Planning:   Code Status: Full Code   Consults: Urology Aurea Pea, MD).  Family Communication:   Severity of Illness: The appropriate patient status for this patient is OBSERVATION. Observation status is judged to be reasonable and necessary in order to provide the required intensity of service to ensure the patient's safety. The patient's presenting symptoms, physical exam findings, and initial radiographic and laboratory data in the context of their medical condition is felt to place them at decreased risk for further clinical deterioration. Furthermore, it is anticipated that the patient will be medically stable for discharge from the hospital within 2 midnights of admission.   Author: Alm Dorn Castor, MD 07/31/2024 8:48 AM  For on call review [email or web address].   This document was prepared using Dragon voice recognition software and may contain some unintended transcription errors.

## 2024-07-31 NOTE — Transfer of Care (Signed)
 Immediate Anesthesia Transfer of Care Note  Patient: Ralph Marquez  Procedure(s) Performed: CYSTOSCOPY/URETEROSCOPY/HOLMIUM LASER/STENT PLACEMENT (Left: Ureter)  Patient Location: PACU  Anesthesia Type:General  Level of Consciousness: awake and patient cooperative  Airway & Oxygen Therapy: Patient Spontanous Breathing and Patient connected to face mask  Post-op Assessment: Report given to RN and Post -op Vital signs reviewed and stable  Post vital signs: Reviewed and stable  Last Vitals:  Vitals Value Taken Time  BP 108/78 07/31/24 10:06  Temp    Pulse 72 07/31/24 10:09  Resp 19 07/31/24 10:09  SpO2 98 % 07/31/24 10:09  Vitals shown include unfiled device data.  Last Pain:  Vitals:   07/31/24 0823  TempSrc:   PainSc: 5          Complications: No notable events documented.

## 2024-07-31 NOTE — Op Note (Signed)
 Preoperative diagnosis: left ureteral calculus  Postoperative diagnosis: left ureteral calculus  Procedure:  Cystoscopy Left ureteroscopy Left ureteral stent placement 6 x 24 double-J  Surgeon: Dr. Steffan Pea  Anesthesia: General  Complications: None  Intraoperative findings: Ureter very narrow unable to get scope past the distal ureter to access the stone.  Decision was made to place stent plan for URS as outpatient.  EBL: Minimal  Specimens: None  Disposition of specimens: [facility] for stone analysis  Indication: LESHAUN BIEBEL is a 68 y.o.   patient with a  left ureteral stone and associated left symptoms after ESWL on 8/22. After reviewing the management options for treatment, the patient elected to proceed with the above surgical procedure(s). We have discussed the potential benefits and risks of the procedure, side effects of the proposed treatment, the likelihood of the patient achieving the goals of the procedure, and any potential problems that might occur during the procedure or recuperation. Informed consent has been obtained.   Description of procedure:  The patient was taken to the operating room and general anesthesia was induced.  The patient was placed in the dorsal lithotomy position, prepped and draped in the usual sterile fashion, and preoperative antibiotics were administered. A preoperative time-out was performed.   Cystourethroscopy was performed.  The patient's urethra was examined and was normal the prostate demonstrated bilobar prostatic hypertrophy and high bladder neck. Pan cystoscopy was performed and the bladder systematically examined in its entirety. There was no evidence for any bladder tumors, stones, or other mucosal pathology.    Attention then turned to the left ureteral orifice and a  sensor wire catheter was used to intubate the ureteral orifice. .  A 0.38 sensor guidewire was then advanced up the left ureter into  the renal pelvis under fluoroscopic guidance. The 4.5 Fr semirigid ureteroscope was then advanced into the ureter this required additional wire to open the ureter.  There was narrowing of the distal ureter that would not a candidate the 4.5 [place] scope.  Decision was made to place stent at that time to avoid injury to the ureter.     The wire was then backloaded through the cystoscope and a ureteral stent was advance over the wire using Seldinger technique.  The stent was positioned appropriately under fluoroscopic and cystoscopic guidance.  The wire was then removed with an adequate stent curl noted in the renal pelvis as well as in the bladder.  The bladder was then emptied and the procedure ended.  The patient appeared to tolerate the procedure well and without complications.  The patient was able to be awakened and transferred to the recovery unit in satisfactory condition.   Disposition: The patient will be admitted to the medicine service for evaluation of AKI.  He will be called to set up ureteroscopy as outpatient.

## 2024-07-31 NOTE — Anesthesia Postprocedure Evaluation (Signed)
 Anesthesia Post Note  Patient: Ahmarion Saraceno Gerken  Procedure(s) Performed: CYSTOSCOPY/URETEROSCOPY/HOLMIUM LASER/STENT PLACEMENT (Left: Ureter)     Patient location during evaluation: PACU Anesthesia Type: General Level of consciousness: awake and alert Pain management: pain level controlled Vital Signs Assessment: post-procedure vital signs reviewed and stable Respiratory status: spontaneous breathing, nonlabored ventilation and respiratory function stable Cardiovascular status: blood pressure returned to baseline and stable Postop Assessment: no apparent nausea or vomiting Anesthetic complications: no   No notable events documented.  Last Vitals:  Vitals:   07/31/24 1058 07/31/24 1125  BP: 133/89 120/84  Pulse: 68 70  Resp: 14 20  Temp: 36.5 C 36.5 C  SpO2: 99% 97%    Last Pain:  Vitals:   07/31/24 1125  TempSrc: Oral  PainSc:                  Kashawna Manzer

## 2024-08-01 ENCOUNTER — Encounter (HOSPITAL_COMMUNITY): Payer: Self-pay | Admitting: Urology

## 2024-08-01 ENCOUNTER — Encounter (INDEPENDENT_AMBULATORY_CARE_PROVIDER_SITE_OTHER): Payer: Self-pay | Admitting: *Deleted

## 2024-08-01 DIAGNOSIS — N179 Acute kidney failure, unspecified: Secondary | ICD-10-CM | POA: Diagnosis not present

## 2024-08-01 LAB — CBC
HCT: 39.9 % (ref 39.0–52.0)
Hemoglobin: 12.9 g/dL — ABNORMAL LOW (ref 13.0–17.0)
MCH: 29.8 pg (ref 26.0–34.0)
MCHC: 32.3 g/dL (ref 30.0–36.0)
MCV: 92.1 fL (ref 80.0–100.0)
Platelets: 321 K/uL (ref 150–400)
RBC: 4.33 MIL/uL (ref 4.22–5.81)
RDW: 14.1 % (ref 11.5–15.5)
WBC: 16.1 K/uL — ABNORMAL HIGH (ref 4.0–10.5)
nRBC: 0 % (ref 0.0–0.2)

## 2024-08-01 LAB — COMPREHENSIVE METABOLIC PANEL WITH GFR
ALT: 18 U/L (ref 0–44)
AST: 11 U/L — ABNORMAL LOW (ref 15–41)
Albumin: 3.1 g/dL — ABNORMAL LOW (ref 3.5–5.0)
Alkaline Phosphatase: 61 U/L (ref 38–126)
Anion gap: 7 (ref 5–15)
BUN: 36 mg/dL — ABNORMAL HIGH (ref 8–23)
CO2: 20 mmol/L — ABNORMAL LOW (ref 22–32)
Calcium: 8.7 mg/dL — ABNORMAL LOW (ref 8.9–10.3)
Chloride: 110 mmol/L (ref 98–111)
Creatinine, Ser: 1.62 mg/dL — ABNORMAL HIGH (ref 0.61–1.24)
GFR, Estimated: 46 mL/min — ABNORMAL LOW (ref >60)
Glucose, Bld: 210 mg/dL — ABNORMAL HIGH (ref 70–99)
Potassium: 4.7 mmol/L (ref 3.5–5.1)
Sodium: 137 mmol/L (ref 135–145)
Total Bilirubin: 0.3 mg/dL (ref 0.0–1.2)
Total Protein: 6.7 g/dL (ref 6.5–8.1)

## 2024-08-01 LAB — HIV ANTIBODY (ROUTINE TESTING W REFLEX): HIV Screen 4th Generation wRfx: NONREACTIVE

## 2024-08-01 LAB — GLUCOSE, CAPILLARY: Glucose-Capillary: 178 mg/dL — ABNORMAL HIGH (ref 70–99)

## 2024-08-01 NOTE — Progress Notes (Signed)
 1 Day Post-Op Subjective: Doing well postoperatively pain is significantly proved, creatinine decreasing to 1.6.  WBC slightly increased to 67.  Objective: Vital signs in last 24 hours: Temp:  [97.6 F (36.4 C)-97.8 F (36.6 C)] 97.6 F (36.4 C) (08/25 0508) Pulse Rate:  [68-74] 69 (08/25 0508) Resp:  [11-20] 20 (08/24 1125) BP: (120-142)/(73-89) 142/89 (08/25 0508) SpO2:  [95 %-99 %] 96 % (08/25 0508)  Intake/Output from previous day: 08/24 0701 - 08/25 0700 In: 2062 [P.O.:480; I.V.:264.6; IV Piggyback:1317.4] Out: 1200 [Urine:1200] Intake/Output this shift: Total I/O In: 120 [P.O.:120] Out: 400 [Urine:400]  Physical Exam:  General: Alert and oriented CV: RRR Lungs: Clear Abdomen: Soft nontender GU: No Foley in place  Lab Results: Recent Labs    07/31/24 0558 08/01/24 0443  HGB 13.8 12.9*  HCT 44.4 39.9   BMET Recent Labs    07/31/24 0558 08/01/24 0443  NA 135 137  K 4.0 4.7  CL 105 110  CO2 18* 20*  GLUCOSE 173* 210*  BUN 41* 36*  CREATININE 2.24* 1.62*  CALCIUM 8.9 8.7*     Studies/Results: DG C-Arm 1-60 Min-No Report Result Date: 07/31/2024 Fluoroscopy was utilized by the requesting physician.  No radiographic interpretation.   CT Renal Stone Study Result Date: 07/31/2024 CLINICAL DATA:  Abdominal/flank pain EXAM: CT ABDOMEN AND PELVIS WITHOUT CONTRAST TECHNIQUE: Multidetector CT imaging of the abdomen and pelvis was performed following the standard protocol without IV contrast. RADIATION DOSE REDUCTION: This exam was performed according to the departmental dose-optimization program which includes automated exposure control, adjustment of the mA and/or kV according to patient size and/or use of iterative reconstruction technique. COMPARISON:  Prior CT scan of the abdomen and pelvis 07/21/2024; 09/07/2015 FINDINGS: Lower chest: No acute abnormality. Hepatobiliary: Low attenuation of the hepatic parenchyma consistent with steatosis. Focal soft tissue  nodule at the fundus of the gallbladder measures 1.6 x 1.5 cm. Similar findings have been present on prior images dating back to at least 2016. 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 Stability over nearly 10 years is consistent with a benign process. No stones or biliary ductal dilatation. Pancreas: Unremarkable. No pancreatic ductal dilatation or surrounding inflammatory changes. Spleen: Normal in size without focal abnormality. Adrenals/Urinary Tract: Mild left hydroureteronephrosis with a 4 mm stone in the mid ureter with at least 3 additional punctate stone fragments adjacent. There is associated periureteral stranding. Additional nonobstructing nephrolithiasis identified bilaterally. 4 mm stone in the right interpolar collecting system and punctate 1-2 mm stones in both lower pole collecting systems. Stomach/Bowel: Stomach is within normal limits. Appendix appears normal. No evidence of bowel wall thickening, distention, or inflammatory changes. Vascular/Lymphatic: Limited evaluation in the absence of intravenous contrast. No aneurysm. Extensive calcified atherosclerotic plaque. No suspicious lymphadenopathy. Reproductive: Prostate is unremarkable. Other: No abdominal wall hernia or abnormality. No abdominopelvic ascites. Musculoskeletal: No acute fracture or aggressive appearing lytic or blastic osseous lesion. Mild grade 1 anterolisthesis of L4 on L5. L5-S1 degenerative disc disease. Extensive facet arthropathy bilaterally throughout the lower lumbar spine. IMPRESSION: 1. Positive for obstructing stones in the left mid ureter resulting in left hydro ureterectasis with perinephric and periureteral edema. The dominant stone measures approximately 4 mm and there are at least 3 adjacent punctate stone fragments. 2. Additional bilateral nonobstructing nephrolithiasis. 3. Hepatic steatosis. 4.  Aortic Atherosclerosis (ICD10-170.0) 5. Lower lumbar degenerative disc disease and facet  arthropathy. 6. Additional ancillary findings as above. Electronically Signed   By: Wilkie Lent M.D.   On:  07/31/2024 07:27    Assessment/Plan: 68 year old gentleman with history of left ureteral stone status post ESWL this is complicated by 2 residual ureteral stones causing severe pain.  Patient was taken to the OR for left uteroscopy on 8/24 his urine area was too narrow and unable to access stone.  Stent was placed patient will be scheduled for follow-up ureteroscopy.   Discussed with patient will need follow-up ureteroscopy.  I have spoken Dr. Nieves about this patient will see our APP in clinic and she will schedule patient for ureteroscopy.  Recommend discharging home on Flomax , hyoscyamine, methocarbamol, and a few opiate medications for breakthrough pain.  Patient may be discharged today.   LOS: 0 days   Jackey Pea MD 08/01/2024, 10:33 AM [facility]

## 2024-08-01 NOTE — Care Management Obs Status (Signed)
 MEDICARE OBSERVATION STATUS NOTIFICATION   Patient Details  Name: KYSEAN SWEET MRN: [id number] Date of Birth: January 25, 1956   Medicare Observation Status Notification Given:  Yes    Doneta Glenys ONEIDA, RN 08/01/2024, 8:32 AM

## 2024-08-01 NOTE — TOC Initial Note (Signed)
 Transition of Care [facility]) - Initial/Assessment Note    Patient Details  Name: Ralph Marquez MRN: [id number] Date of Birth: 1956/07/24  [facility] CM/SW Contact:    Doneta Glenys ONEIDA, RN Phone Number: 08/01/2024, 8:53 AM  Clinical Narrative:                 MOON completed.Presented for left flank pain.Patient states PTA lives in a house with wife Bruna [phone].Verified PCP/insurance. DME-cane, walker, rollator, WC, BSC, [place], ;Denies HH, oxygen, or SDOH needs. Transportation at discharge will be wife or son.  No Inpatient Case Management needs identified during visit.  Expected Discharge Plan: Home/Self Care Barriers to Discharge: No Barriers Identified   Patient Goals and CMS Choice Patient states their goals for this hospitalization and ongoing recovery are:: Home with wife CMS Medicare.gov Compare Post Acute Care list provided to::  (NA) Choice offered to / list presented to : NA Pax ownership interest in [facility].provided to:: Parent NA    Expected Discharge Plan and Services In-house Referral: NA Discharge Planning Services: CM Consult Post Acute Care Choice: NA Living arrangements for the past 2 months: Single Family Home                 DME Arranged: N/A DME Agency: NA       HH Arranged: NA HH Agency: NA        Prior Living Arrangements/Services Living arrangements for the past 2 months: Single Family Home Lives with:: Spouse Patient language and need for interpreter reviewed:: Yes        Need for Family Participation in Patient Care: No (Comment) Care giver support system in place?: Yes (comment) Current home services: DME (cane, walker, rollator, WC, [facility]SC, [place],) Criminal Activity/Legal Involvement Pertinent to Current Situation/Hospitalization: No - Comment as needed  Activities of Daily Living   ADL Screening (condition at time of admission) Independently performs ADLs?: Yes (appropriate for developmental age) Is the  patient deaf or have difficulty hearing?: No Does the patient have difficulty seeing, even when wearing glasses/contacts?: No Does the patient have difficulty concentrating, remembering, or making decisions?: No  Permission Sought/Granted Permission sought to share information with : Case Manager Permission granted to share information with : Yes, Verbal Permission Granted  Share Information with NAME: Bruna wife [phone]           Emotional Assessment Appearance:: Appears stated age Attitude/Demeanor/Rapport: Engaged Affect (typically observed): Appropriate Orientation: : Oriented to Self, Oriented to Place, Oriented to  Time, Oriented to Situation Alcohol / Substance Use: Not Applicable Psych Involvement: No (comment)  Admission diagnosis:  AKI (acute kidney injury) (HCC) [N17.9] Left nephrolithiasis [N20.0] Patient Active Problem List   Diagnosis Date Noted   AKI (acute kidney injury) (HCC) 07/31/2024   Class 1 obesity 07/31/2024   Nephrolithiasis 07/31/2024   Pancreatic lesion 07/27/2024   Kidney stone 07/27/2024   Hepatic steatosis 07/27/2024   Left lower quadrant abdominal pain 07/27/2024   Primary osteoarthritis of left hip 07/25/2024   Type 2 diabetes mellitus with diabetic chronic kidney disease (HCC) 05/20/2024   Chronic kidney disease, stage 3a (HCC) 05/20/2024   Vitamin D  deficiency 05/20/2024   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, left 02/09/2024   Essential hypertension 01/29/2024   Mixed hyperlipidemia 01/29/2024   GERD (gastroesophageal reflux disease) 01/29/2024   Psychophysiologic insomnia 01/29/2024   Primary osteoarthritis of left knee 01/29/2024   Anterior communicating artery aneurysm 01/29/2024   Family history of colon cancer  01/29/2024   Cerebral aneurysm 08/25/2018   PCP:  Bevely Doffing, FNP Pharmacy:   [facility] - [place], [place] - [address] [place] [address] Phone: [phone]  Fax: [phone]     Social Drivers of Health (SDOH) Social History: SDOH Screenings   Food Insecurity: No Food Insecurity (07/31/2024)  Housing: Low Risk  (07/31/2024)  Transportation Needs: No Transportation Needs (07/31/2024)  Utilities: Not At Risk (07/31/2024)  Depression (PHQ2-9): Medium Risk (07/27/2024)  Financial Resource Strain: Low Risk  (06/08/2024)   Received from [facility]  Social Connections: Moderately Isolated (07/31/2024)  Tobacco Use: Medium Risk (07/31/2024)   SDOH Interventions:     Readmission Risk Interventions     No data to display

## 2024-08-01 NOTE — Discharge Summary (Signed)
 Physician Discharge Summary  Patient ID: TAMER BAUGHMAN MRN: [id number] DOB/AGE: 67/13/1957 67 y.o.  Admit date: 07/31/2024 Discharge date: 08/01/2024  Admission Diagnoses:  Discharge Diagnoses:  Principal Problem:   Acute kidney injury on chronic kidney disease stage 3A Active Problems:   Essential hypertension   Mixed hyperlipidemia   GERD (gastroesophageal reflux disease)   Type 2 diabetes mellitus with diabetic chronic kidney disease (HCC)   Chronic kidney disease, stage 3a (HCC)   Hepatic steatosis   Class 1 obesity   Nephrolithiasis   Left hydro ureterectasis with perinephric and periureteral edema       Discharged Condition: stable  Hospital Course: Patient is a 68 year old male with past medical history significant for 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  Patient was seen recently with left flank pain, found to have left ureteral calculus, and patient subsequently underwent lithotripsy 1-day prior to presentation.  Patient presented with left flank pain, associated with mild nausea and hematuria, without any other constitutional symptoms.  Patient was found to have worsening renal function on presentation.  CT renal stone protocol done on presentation revealed obstructing stones in the left mid ureter resulting in left hydro ureterectasis with perinephric and periureteral edema, the dominant stone measured approximately 4 mm and there were at least 3 adjacent punctate stone fragments.  Additional bilateral nonobstructing nephrolithiasis was also reported.  Urology team was consulted to assist with patient's management.  Patient underwent cystoscopy, left ureteroscopy and left ureteral stent placement.  Left flank pain has resolved.  Acute kidney injury has improved significantly.  Patient is eager  to be discharged back home.  Patient will follow-up with urology team and primary care team on discharge.  Acute kidney injury on chronic kidney disease stage IIIa:  -Acute kidney injury most likely secondary to obstructive uropathy.   -AKI has improved. - Continue to monitor renal function on discharge.    Essential hypertension: Continued Cardizem  360 mg p.o. daily. Held the spironolactone  due to AKI.   Mixed hyperlipidemia: On pravastatin  20 mg p.o. daily.   GERD (gastroesophageal reflux disease): Continue pantoprazole  40 mg p.o. twice daily.   Type 2 diabetes mellitus with diabetic chronic kidney disease (HCC): Carbohydrate modified diet. Held Jardiance  due to AKI. Continue to monitor renal function and electrolytes. Continue to optimize diabetes mellitus treatment.   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   PCP to consider referral patient to GI team.     Class 1 obesity Current BMI 30.71 kg/m. Diet and exercise. Consider Ozempic or Mounjaro (will defer to the primary care provider).    Consults: urology  Significant Diagnostic Studies:  Cystoscopy Left ureteroscopy Left ureteral stent placement 6 x 24 double-J  Baseline Scr of 1.35.  Admitted with Scr of 2.24.  Scr improved to 1.62 prior to discharge  CT renal stone study revealed: 1. Positive for obstructing stones in the left mid ureter resulting in left hydro ureterectasis with perinephric and periureteral edema. The dominant stone measures approximately 4 mm and there are at least 3 adjacent punctate stone fragments. 2. Additional bilateral nonobstructing nephrolithiasis. 3. Hepatic steatosis. 4.  Aortic Atherosclerosis (ICD10-170.0) 5. Lower lumbar degenerative disc disease and facet arthropathy.  Discharge Exam: Blood pressure (!) 142/89, pulse 69, temperature 97.6 F (36.4 C), resp. rate 20, height 5' 10 (1.778 m), weight 97.1 kg, SpO2 96%.   Disposition: Discharge disposition:  01-Home or Self Care       Discharge Instructions     Diet - low sodium heart healthy   Complete by: As directed    Increase activity slowly   Complete by: As directed       Allergies as of 08/01/2024       Reactions   Amlodipine  Hives   Lisinopril Hives   Losartan Potassium Hives   Betadine [povidone Iodine] Rash        Medication List     STOP taking these medications    celecoxib  50 MG capsule Commonly known as: CELEBREX    cyclobenzaprine 10 MG tablet Commonly known as: FLEXERIL   etodolac 500 MG tablet Commonly known as: LODINE   ondansetron  4 MG tablet Commonly known as: ZOFRAN    Voltaren 1 % Gel Generic drug: diclofenac Sodium       TAKE these medications    diltiazem  360 MG 24 hr capsule Commonly known as: CARDIZEM  CD Take 1 capsule (360 mg total) by mouth daily.   empagliflozin  10 MG Tabs tablet Commonly known as: Jardiance  Take 1 tablet (10 mg total) by mouth daily.   oxyCODONE  5 MG immediate release tablet Commonly known as: Roxicodone  Take 1-2 tablets (5-10 mg total) by mouth every 4 (four) hours as needed for moderate pain (pain score 4-6) or severe pain (pain score 7-10).   pantoprazole  40 MG tablet Commonly known as: PROTONIX  Take 1 tablet (40 mg total) by mouth 2 (two) times daily before a meal.   pravastatin  20 MG tablet Commonly known as: PRAVACHOL  Take 1 tablet (20 mg total) by mouth daily. What changed: when to take this   spironolactone  25 MG tablet Commonly known as: ALDACTONE  Take 0.5 tablets (12.5 mg total) by mouth daily.   tamsulosin  0.4 MG Caps capsule Commonly known as: FLOMAX  Take 1 capsule (0.4 mg total) by mouth daily.   triazolam  0.25 MG tablet Commonly known as: HALCION  Take 1 tablet (0.25 mg total) by mouth at bedtime as needed for sleep.   Vitamin D  (Ergocalciferol ) 1.25 MG (50000 UNIT) Caps capsule Commonly known as: DRISDOL  Take 1 capsule (50,000 Units total) by mouth every 7 (seven) days. What  changed: additional instructions        Follow-up Information     Nieves Cough, MD Follow up on 08/04/2024.   Specialty: Urology Why: at 1:45 PM for xray to check stent/stone and visit with NP [facility] information: [address] [place] [address] [phone]                Time spent: 35 Minutes  Signed: Leatrice LILLETTE Chapel 08/01/2024, 12:30 PM

## 2024-08-03 ENCOUNTER — Other Ambulatory Visit: Payer: Self-pay | Admitting: Internal Medicine

## 2024-08-03 ENCOUNTER — Other Ambulatory Visit: Payer: Self-pay | Admitting: Urology

## 2024-08-16 ENCOUNTER — Ambulatory Visit (INDEPENDENT_AMBULATORY_CARE_PROVIDER_SITE_OTHER): Payer: Self-pay | Admitting: Nurse Practitioner

## 2024-08-16 ENCOUNTER — Encounter: Payer: Self-pay | Admitting: Nurse Practitioner

## 2024-08-16 VITALS — BP 126/86 | HR 96 | Ht 70.0 in | Wt 213.0 lb

## 2024-08-16 DIAGNOSIS — N200 Calculus of kidney: Secondary | ICD-10-CM | POA: Diagnosis not present

## 2024-08-16 DIAGNOSIS — R5383 Other fatigue: Secondary | ICD-10-CM

## 2024-08-16 DIAGNOSIS — R7301 Impaired fasting glucose: Secondary | ICD-10-CM

## 2024-08-16 DIAGNOSIS — N189 Chronic kidney disease, unspecified: Secondary | ICD-10-CM | POA: Diagnosis not present

## 2024-08-16 DIAGNOSIS — R1032 Left lower quadrant pain: Secondary | ICD-10-CM | POA: Diagnosis not present

## 2024-08-16 MED ORDER — MORPHINE SULFATE ER 15 MG PO TBCR: 15.0000 mg | EXTENDED_RELEASE_TABLET | Freq: Two times a day (BID) | ORAL | 0 refills | Status: DC

## 2024-08-16 NOTE — Patient Instructions (Signed)
 1) Pain - take MS Contin  nightly at bedtime- do not take with oxycodone  2) Nephrolithiasis - eGFR and communicate with Dr. Nieves per patient request 3) Follow up appt in 1 week

## 2024-08-16 NOTE — Addendum Note (Signed)
 Addended by: GLENNON SAND on: 08/16/2024 11:42 AM   Modules accepted: Orders

## 2024-08-16 NOTE — Progress Notes (Addendum)
 Established Patient Office Visit  Subjective:  Patient ID: Ralph Marquez, male    DOB: 05/12/1956  Age: 68 y.o. MRN: [id number]  Chief Complaint  Patient presents with   Hospitalization Follow-up    Patient here today for an acute visit reporting pain 9/10.  Patient is taking his oxycodone  for his knee pain due to elevated kidney pain.  Patient has had an appt with Dr. Nieves [phone]).  He has ureteral stent and he reports his urine is bloody.  He has tried to contact [facility] yesterday without any result or return phone call.  Patient states procedure is set for 19th.  Had ablation last Friday.  Would like increased pain medication and needs check kidney function.     No other concerns at this time.   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   s/p  08-25-2018  IR embolization cerebral coilling x3   CKD (chronic kidney disease), stage III (HCC)    GERD (gastroesophageal reflux disease)    Hyperlipidemia, mixed    Hypertension    Left flank pain    Left ureteral calculus 07/21/2024   OA (osteoarthritis)    Psychophysiologic insomnia 01/29/2024   Renal calculi 07/21/2024   bilateral   Type 2 diabetes mellitus (HCC)    followed by pcp   Wears dentures    full upper   Wears glasses     Past Surgical History:  Procedure Laterality Date   CEREBRAL EMBOLIZATION  08/25/2018   @[facility];   x3  coiling superiorly directed Acomm aneurysm   CYSTOSCOPY/URETEROSCOPY/HOLMIUM LASER/STENT PLACEMENT Left 07/31/2024   Procedure: CYSTOSCOPY/URETEROSCOPY/HOLMIUM LASER/STENT PLACEMENT;  Surgeon: Shane Steffan BROCKS, MD;  Location: [facility];  Service: Urology;  Laterality: Left;   ESOPHAGOGASTRODUODENOSCOPY N/A 07/30/2017   Procedure: ESOPHAGOGASTRODUODENOSCOPY (EGD) Possible esophageal dilation.;  Surgeon: Golda Claudis PENNER, MD;  Location: [facility];  Service: Endoscopy;  Laterality: N/A;   EXTRACORPOREAL SHOCK WAVE LITHOTRIPSY Left  07/29/2024   Procedure: LITHOTRIPSY, ESWL;  Surgeon: Nieves Cough, MD;  Location: [facility];  Service: Urology;  Laterality: Left;   ROTATOR CUFF REPAIR W/ DISTAL CLAVICLE EXCISION  08/03/2003   @[facility]  by dr duda   TOTAL KNEE ARTHROPLASTY Left 02/09/2024   Procedure: TOTAL KNEE ARTHROPLASTY;  Surgeon: Edie Norleen PARAS, MD;  Location: [facility];  Service: Orthopedics;  Laterality: Left;   TOTAL KNEE ARTHROPLASTY Right 2016   @[facility]     Social History   Socioeconomic History   Marital status: Married    Spouse name: Bruna   Number of children: Not on file   Years of education: Not on file   Highest education level: Not on file  Occupational History   Not on file  Tobacco Use   Smoking status: Former    Types: Cigarettes   Smokeless tobacco: Never   Tobacco comments:    07-28-2024  pt quit smoking 2005  Vaping Use   Vaping status: Never Used  Substance and Sexual Activity   Alcohol use: No   Drug use: Never   Sexual activity: Not on file  Other Topics Concern   Not on file  Social History Narrative   Not on file   Social Drivers of Health   Financial Resource Strain: Low Risk  (06/08/2024)   Received from [facility]   Overall Financial Resource Strain (CARDIA)    Difficulty of Paying Living Expenses: Not hard at all  Food Insecurity: No Food Insecurity (07/31/2024)   Hunger  Vital Sign    Worried About Programme researcher, broadcasting/film/video in the Last Year: Never true    Ran Out of Food in the Last Year: Never true  Transportation Needs: No Transportation Needs (07/31/2024)   PRAPARE - Administrator, Civil Service (Medical): No    Lack of Transportation (Non-Medical): No  Physical Activity: Not on file  Stress: Not on file  Social Connections: Moderately Isolated (07/31/2024)   Social Connection and Isolation Panel    Frequency of Communication with Friends and Family: Twice a week    Frequency of Social Gatherings with Friends and Family: Once a week    Attends  Religious Services: Never    Database administrator or Organizations: No    Attends Banker Meetings: Never    Marital Status: Married  Catering manager Violence: Not At Risk (07/31/2024)   Humiliation, Afraid, Rape, and Kick questionnaire    Fear of Current or Ex-Partner: No    Emotionally Abused: No    Physically Abused: No    Sexually Abused: No    Family History  Problem Relation Age of Onset   CAD Mother        CABG in 75's   CVA Sister     Allergies  Allergen Reactions   Amlodipine  Hives   Lisinopril Hives   Losartan Potassium Hives   Betadine [Povidone Iodine] Rash    Outpatient Medications Prior to Visit  Medication Sig   diltiazem  (CARDIZEM  CD) 360 MG 24 hr capsule Take 1 capsule (360 mg total) by mouth daily. (Patient taking differently: Take 360 mg by mouth daily.)   empagliflozin  (JARDIANCE ) 10 MG TABS tablet Take 1 tablet (10 mg total) by mouth daily. (Patient taking differently: Take 10 mg by mouth daily.)   oxyCODONE  (ROXICODONE ) 5 MG immediate release tablet Take 1-2 tablets (5-10 mg total) by mouth every 4 (four) hours as needed for moderate pain (pain score 4-6) or severe pain (pain score 7-10).   pantoprazole  (PROTONIX ) 40 MG tablet Take 1 tablet (40 mg total) by mouth 2 (two) times daily before a meal.   pravastatin  (PRAVACHOL ) 20 MG tablet Take 1 tablet (20 mg total) by mouth daily. (Patient taking differently: Take 20 mg by mouth at bedtime.)   spironolactone  (ALDACTONE ) 25 MG tablet Take 0.5 tablets (12.5 mg total) by mouth daily. (Patient taking differently: Take 12.5 mg by mouth daily.)   tamsulosin  (FLOMAX ) 0.4 MG CAPS capsule Take 1 capsule (0.4 mg total) by mouth daily.   triazolam  (HALCION ) 0.25 MG tablet Take 1 tablet (0.25 mg total) by mouth at bedtime as needed for sleep. (Patient taking differently: Take 0.25 mg by mouth at bedtime as needed for sleep.)   Vitamin D , Ergocalciferol , (DRISDOL ) 1.25 MG (50000 UNIT) CAPS capsule Take 1  capsule (50,000 Units total) by mouth every 7 (seven) days. (Patient taking differently: Take 50,000 Units by mouth every 7 (seven) days. Thursday's)   No facility-administered medications prior to visit.    ROS     Objective:   BP 126/86   Pulse 96   Ht 5' 10 (1.778 m)   Wt 213 lb (96.6 kg)   SpO2 98%   BMI 30.56 kg/m   Vitals:   08/16/24 1049  BP: 126/86  Pulse: 96  Height: 5' 10 (1.778 m)  Weight: 213 lb (96.6 kg)  SpO2: 98%  BMI (Calculated): 30.56    Physical Exam Vitals and nursing note reviewed.  Constitutional:  Appearance: Normal appearance.  HENT:     Head: Normocephalic.     Nose: Nose normal.     Mouth/Throat:     Mouth: Mucous membranes are moist.  Cardiovascular:     Rate and Rhythm: Normal rate and regular rhythm.     Pulses: Normal pulses.     Heart sounds: Normal heart sounds.  Pulmonary:     Effort: Pulmonary effort is normal.     Breath sounds: Normal breath sounds.  Musculoskeletal:        General: Tenderness present.     Cervical back: Normal range of motion and neck supple.  Skin:    General: Skin is warm and dry.  Neurological:     Mental Status: He is alert and oriented to person, place, and time.  Psychiatric:        Mood and Affect: Mood normal.        Behavior: Behavior normal.      No results found for any visits on 08/16/24.  Recent Results (from the past 2160 hours)  Urine Microalbumin w/creat. ratio     Status: None   Collection Time: 05/20/24 11:00 AM  Result Value Ref Range   Creatinine, Urine 438.0 Not Estab. mg/dL   Microalbumin, Urine 03.9 Not Estab. ug/mL   Microalb/Creat Ratio 22 0 - 29 mg/g creat    Comment:                        Normal:                0 -  29                        Moderately increased: 30 - 300                        Severely increased:       >300   Vitamin D  (25 hydroxy)     Status: None   Collection Time: 05/20/24 11:23 AM  Result Value Ref Range   Vit D, 25-Hydroxy 34.3 30.0  - 100.0 ng/mL    Comment: Vitamin D  deficiency has been defined by the Institute of Medicine and an Endocrine Society practice guideline as a level of serum 25-OH vitamin D  less than 20 ng/mL (1,2). The Endocrine Society went on to further define vitamin D  insufficiency as a level between 21 and 29 ng/mL (2). 1. IOM (Institute of Medicine). 2010. Dietary reference    intakes for calcium and D. [place]: The    [employer]. 2. Holick MF, Binkley Mulberry, Bischoff-Ferrari HA, et al.    Evaluation, treatment, and prevention of vitamin D     deficiency: an Endocrine Society clinical practice    guideline. JCEM. 2011 Jul; 96(7):1911-30.   Urinalysis     Status: Abnormal   Collection Time: 07/18/24 10:03 AM  Result Value Ref Range   Specific Gravity, UA >1.030 (H) 1.005 - 1.030   pH, UA 5.5 5.0 - 7.5   Color, UA Amber (A) Yellow   Appearance Ur Hazy (A) Clear   Leukocytes,UA Negative Negative   Protein,UA 1+ (A) Negative/Trace   Glucose, UA Trace (A) Negative   Ketones, UA Negative Negative   RBC, UA 1+ (A) Negative   Bilirubin, UA Negative Negative   Urobilinogen, Ur 0.2 0.2 - 1.0 mg/dL   Nitrite, UA Negative Negative  Basic metabolic panel  Status: Abnormal   Collection Time: 07/31/24  5:58 AM  Result Value Ref Range   Sodium 135 135 - 145 mmol/L   Potassium 4.0 3.5 - 5.1 mmol/L   Chloride 105 98 - 111 mmol/L   CO2 18 (L) 22 - 32 mmol/L   Glucose, Bld 173 (H) 70 - 99 mg/dL    Comment: Glucose reference range applies only to samples taken after fasting for at least 8 hours.   BUN 41 (H) 8 - 23 mg/dL   Creatinine, Ser 7.75 (H) 0.61 - 1.24 mg/dL   Calcium 8.9 8.9 - 89.6 mg/dL   GFR, Estimated 31 (L) >60 mL/min    Comment: (NOTE) Calculated using the CKD-EPI Creatinine Equation (2021)    Anion gap 12 5 - 15    Comment: Performed at [facility], [address], [place], [place] [address]  CBC     Status: Abnormal   Collection Time: 07/31/24   5:58 AM  Result Value Ref Range   WBC 14.3 (H) 4.0 - 10.5 K/uL   RBC 4.85 4.22 - 5.81 MIL/uL   Hemoglobin 13.8 13.0 - 17.0 g/dL   HCT 55.5 60.9 - 47.9 %   MCV 91.5 80.0 - 100.0 fL   MCH 28.5 26.0 - 34.0 pg   MCHC 31.1 30.0 - 36.0 g/dL   RDW 85.9 88.4 - 84.4 %   Platelets 352 150 - 400 K/uL   nRBC 0.0 0.0 - 0.2 %    Comment: Performed at [facility], [address] [place], [place] [address]  Urinalysis, Routine w reflex microscopic -Urine, Clean Catch     Status: Abnormal   Collection Time: 07/31/24  6:03 AM  Result Value Ref Range   Color, Urine STRAW (A) YELLOW   APPearance CLEAR CLEAR   Specific Gravity, Urine 1.017 1.005 - 1.030   pH 5.0 5.0 - 8.0   Glucose, UA >=500 (A) NEGATIVE mg/dL   Hgb urine dipstick LARGE (A) NEGATIVE   Bilirubin Urine NEGATIVE NEGATIVE   Ketones, ur NEGATIVE NEGATIVE mg/dL   Protein, ur NEGATIVE NEGATIVE mg/dL   Nitrite NEGATIVE NEGATIVE   Leukocytes,Ua NEGATIVE NEGATIVE   RBC / HPF 21-50 0 - 5 RBC/hpf   WBC, UA 0-5 0 - 5 WBC/hpf   Bacteria, UA NONE SEEN NONE SEEN   Squamous Epithelial / HPF 0-5 0 - 5 /HPF   Mucus PRESENT     Comment: Performed at [facility], [address] [place], [place] [address]  Glucose, capillary     Status: Abnormal   Collection Time: 07/31/24 10:11 AM  Result Value Ref Range   Glucose-Capillary 131 (H) 70 - 99 mg/dL    Comment: Glucose reference range applies only to samples taken after fasting for at least 8 hours.  Glucose, capillary     Status: Abnormal   Collection Time: 07/31/24  5:13 PM  Result Value Ref Range   Glucose-Capillary 216 (H) 70 - 99 mg/dL    Comment: Glucose reference range applies only to samples taken after fasting for at least 8 hours.  Glucose, capillary     Status: Abnormal   Collection Time: 07/31/24  9:15 PM  Result Value Ref Range   Glucose-Capillary 174 (H) 70 - 99 mg/dL    Comment: Glucose reference range applies only to samples taken after  fasting for at least 8 hours.   Comment 1 Notify RN   HIV Antibody (routine testing w rflx)  Status: None   Collection Time: 08/01/24  4:43 AM  Result Value Ref Range   HIV Screen 4th Generation wRfx Non Reactive Non Reactive    Comment: Performed at [facility], [address], [place], [place] [address]  Comprehensive metabolic panel     Status: Abnormal   Collection Time: 08/01/24  4:43 AM  Result Value Ref Range   Sodium 137 135 - 145 mmol/L   Potassium 4.7 3.5 - 5.1 mmol/L   Chloride 110 98 - 111 mmol/L   CO2 20 (L) 22 - 32 mmol/L   Glucose, Bld 210 (H) 70 - 99 mg/dL    Comment: Glucose reference range applies only to samples taken after fasting for at least 8 hours.   BUN 36 (H) 8 - 23 mg/dL   Creatinine, Ser 8.37 (H) 0.61 - 1.24 mg/dL   Calcium 8.7 (L) 8.9 - 10.3 mg/dL   Total Protein 6.7 6.5 - 8.1 g/dL   Albumin 3.1 (L) 3.5 - 5.0 g/dL   AST 11 (L) 15 - 41 U/L   ALT 18 0 - 44 U/L   Alkaline Phosphatase 61 38 - 126 U/L   Total Bilirubin 0.3 0.0 - 1.2 mg/dL   GFR, Estimated 46 (L) >60 mL/min    Comment: (NOTE) Calculated using the CKD-EPI Creatinine Equation (2021)    Anion gap 7 5 - 15    Comment: Performed at [facility], [address] [place], [place] [address]  CBC     Status: Abnormal   Collection Time: 08/01/24  4:43 AM  Result Value Ref Range   WBC 16.1 (H) 4.0 - 10.5 K/uL   RBC 4.33 4.22 - 5.81 MIL/uL   Hemoglobin 12.9 (L) 13.0 - 17.0 g/dL   HCT 60.0 60.9 - 47.9 %   MCV 92.1 80.0 - 100.0 fL   MCH 29.8 26.0 - 34.0 pg   MCHC 32.3 30.0 - 36.0 g/dL   RDW 85.8 88.4 - 84.4 %   Platelets 321 150 - 400 K/uL   nRBC 0.0 0.0 - 0.2 %    Comment: Performed at [facility], [address], [place], [place] [address]  Glucose, capillary     Status: Abnormal   Collection Time: 08/01/24  7:24 AM  Result Value Ref Range   Glucose-Capillary 178 (H) 70 - 99 mg/dL    Comment: Glucose reference range applies only to samples  taken after fasting for at least 8 hours.      Assessment & Plan:  1) Pain - take MS Contin  nightly at bedtime- do not take with oxycodone  2) Nephrolithiasis - eGFR and communicate with Dr. Nieves per patient request 3) Follow up appt in 1 week  Addended note - [facility] pharmacist contacted this provider that the patient is no longer taking oxycodone   5-10 mg every 4 hours which patient stated he took 1 tab in the AM and would take the MS Contin  In the PM.  But he is now on a different pain med with Dr. Almira with different instructions so have  D/c'ed the MS contin  order via telephone with pharmacist today to prevent hypersedation.   Problem List Items Addressed This Visit   None   No follow-ups on file.   Total time spent: 25 minutes  Neale Carpen, NP  08/16/2024   This document may have been prepared by [facility] Voice Recognition software and as such may include unintentional dictation errors.

## 2024-08-17 ENCOUNTER — Ambulatory Visit: Payer: Self-pay

## 2024-08-17 DIAGNOSIS — M19071 Primary osteoarthritis, right ankle and foot: Secondary | ICD-10-CM | POA: Diagnosis not present

## 2024-08-17 DIAGNOSIS — M79671 Pain in right foot: Secondary | ICD-10-CM | POA: Diagnosis not present

## 2024-08-17 DIAGNOSIS — Q66222 Congenital metatarsus adductus, left foot: Secondary | ICD-10-CM | POA: Diagnosis not present

## 2024-08-17 DIAGNOSIS — Q66221 Congenital metatarsus adductus, right foot: Secondary | ICD-10-CM | POA: Diagnosis not present

## 2024-08-17 DIAGNOSIS — M79672 Pain in left foot: Secondary | ICD-10-CM | POA: Diagnosis not present

## 2024-08-17 DIAGNOSIS — M19072 Primary osteoarthritis, left ankle and foot: Secondary | ICD-10-CM | POA: Diagnosis not present

## 2024-08-17 LAB — CMP14+EGFR
ALT: 23 IU/L (ref 0–44)
AST: 15 IU/L (ref 0–40)
Albumin: 4.4 g/dL (ref 3.9–4.9)
Alkaline Phosphatase: 99 IU/L (ref 44–121)
BUN/Creatinine Ratio: 20 (ref 10–24)
BUN: 27 mg/dL (ref 8–27)
Bilirubin Total: 0.4 mg/dL (ref 0.0–1.2)
CO2: 20 mmol/L (ref 20–29)
Calcium: 9.8 mg/dL (ref 8.6–10.2)
Chloride: 103 mmol/L (ref 96–106)
Creatinine, Ser: 1.37 mg/dL — ABNORMAL HIGH (ref 0.76–1.27)
Globulin, Total: 3.2 g/dL (ref 1.5–4.5)
Glucose: 144 mg/dL — ABNORMAL HIGH (ref 70–99)
Potassium: 4.5 mmol/L (ref 3.5–5.2)
Sodium: 142 mmol/L (ref 134–144)
Total Protein: 7.6 g/dL (ref 6.0–8.5)
eGFR: 57 mL/min/1.73 — ABNORMAL LOW

## 2024-08-17 LAB — HEMOGLOBIN A1C
Est. average glucose Bld gHb Est-mCnc: 160 mg/dL
Hgb A1c MFr Bld: 7.2 % — ABNORMAL HIGH (ref 4.8–5.6)

## 2024-08-22 ENCOUNTER — Encounter (HOSPITAL_COMMUNITY): Payer: Self-pay | Admitting: Urology

## 2024-08-22 NOTE — Progress Notes (Addendum)
 Spoke w/ via phone for pre-op interview--- [facility] needs dos----  BMP, CBG and A1C per anesthesia       Lab results------Current EKG in Epic dated 02/11/24. COVID test -----patient states asymptomatic no test needed Arrive at -------1245 NPO after MN NO Solid Food.  Clear liquids from MN until---1145 Pre-Surgery Ensure or G2:  Med rec completed Medications to take morning of surgery ----- Diltiazem  and Protonix . Diabetic medication ----- Hold Jardiance  3 days prior to surgery.   GLP1 agonist last dose: GLP1 instructions:  Patient instructed no nail polish to be worn day of surgery Patient instructed to bring photo id and insurance card day of surgery Patient aware to have Driver (ride ) / caregiver    for 24 hours after surgery - Son Nancyann Ohlson Patient Special Instructions ----- Pre-Op special Instructions ----- NS IV fluid pt has Rocephin  ordered  Patient verbalized understanding of instructions that were given at this phone interview. Patient denies chest pain, sob, fever, cough at the interview.

## 2024-08-23 ENCOUNTER — Other Ambulatory Visit: Payer: Self-pay | Admitting: Student

## 2024-08-23 ENCOUNTER — Ambulatory Visit: Admitting: Nurse Practitioner

## 2024-08-23 DIAGNOSIS — Z96652 Presence of left artificial knee joint: Secondary | ICD-10-CM

## 2024-08-23 DIAGNOSIS — M1712 Unilateral primary osteoarthritis, left knee: Secondary | ICD-10-CM

## 2024-08-25 ENCOUNTER — Ambulatory Visit

## 2024-08-25 NOTE — Progress Notes (Signed)
 Patient surgery start time has been changed to 1200.  Called and spoke w/ patient after identified name & dob and his wife via phone.  Both verbalized understanding to arrival at 1000 be npo after midnight with exception clear liquids until 0900. At first patient stated he was not taking any medication morning of surgery but I reminded him he was to take his diltiazem / protonix , then wife stated she already has them out for him to take in the morning.   Patient expressed that he would need his wife in pre-op to help with his medication since he does not them very well, told patient to let them in the morning.

## 2024-08-25 NOTE — H&P (Signed)
 Office Visit Report     07/28/2024   --------------------------------------------------------------------------------   Ralph Marquez  MRN: [id number]  DOB: 1955/12/22, 68 year old Male  SSN:    PRIMARY CARE:     REFERRING:    PROVIDER:  Donnice Brooks, M.D.  LOCATION:  [facility] [phone]     --------------------------------------------------------------------------------   CC/HPI: New patient-   1) urolithiasis-patient had left flank pain and went to see his PCP. A July 21, 2024 when CT scan revealed a 5 to 6 mm left proximal stone. Small bilateral renal stones. Difficult to see the stone on the scout image. HU 572. No labs.   Today, added on for left groin pain. Pain left flank. He has not seen a stone pass. On tamsulosin , celecoxib  and oxycodone . KUB with 4 mm left proximal stone outside of renal shadow. Confirmed he was on apixaban  for DVT prophylaxis in Mar 2025 and she wrote down not sure of what it was because it was on hsopital list. He showed me pic of his meds - on asa 81 mg. No apixaban . I gave him 15 toradol  IM. Felt much better.   No prior stones.   He had a left knee replacement Mar 2025.     ALLERGIES: No Allergies    MEDICATIONS: Tamsulosin  HCl 1 PO Daily  Celecoxib  1 PO Daily  dilTIAZem  HCl 1 PO Daily  Eliquis  1 PO Daily  Etodolac 1 PO Daily  Jardiance  1 PO Daily  Ondansetron  1 PO Daily  oxyCODONE  HCl 1 PO Daily  oxyCODONE  HCl 1 PO Daily  Pantoprazole  Sodium 1 PO Daily  Pravastatin  Sodium 1 PO Daily  Pravastatin  Sodium 1 PO Daily  Spironolactone  1 PO Daily  Triazolam  1 PO Daily  Vitamin D  1 PO Daily     GU PSH: None   NON-GU PSH: Knee replacement     GU PMH: No GU PMH    NON-GU PMH: No Non-GU PMH    FAMILY HISTORY: No Family History    SOCIAL HISTORY: Marital Status: Married Current Smoking Status: Patient has never smoked.   Tobacco Use Assessment Completed: Used Tobacco in last 30 days? Drinks 2  caffeinated drinks per day.    REVIEW OF SYSTEMS:    GU Review Male:   blood in urine.  Patient reports frequent urination, burning/ pain with urination, and get up at night to urinate. Patient denies hard to postpone urination, leakage of urine, stream starts and stops, trouble starting your stream, have to strain to urinate , erection problems, and penile pain.  Gastrointestinal (Upper):   Patient denies nausea, vomiting, and indigestion/ heartburn.  Gastrointestinal (Lower):   Patient denies diarrhea and constipation.  Constitutional:   Patient denies fever, night sweats, weight loss, and fatigue.  Skin:   Patient denies skin rash/ lesion and itching.  Eyes:   Patient reports blurred vision. Patient denies double vision.  Ears/ Nose/ Throat:   Patient denies sinus problems and sore throat.  Hematologic/Lymphatic:   Patient denies swollen glands and easy bruising.  Cardiovascular:   Patient denies leg swelling and chest pains.  Respiratory:   Patient denies cough and shortness of breath.  Endocrine:   Patient denies excessive thirst.  Musculoskeletal:   Patient reports joint pain. Patient denies back pain.  Neurological:   Patient denies headaches and dizziness.  Psychologic:   Patient denies depression and anxiety.   VITAL SIGNS:      07/28/2024 03:03 PM  Weight 215 lb / 97.52 kg  Height 70 in / 177.8 cm  BP 118/72 mmHg  Pulse 65 /min  Temperature 97.3 F / 36.2 C  BMI 30.8 kg/m   MULTI-SYSTEM PHYSICAL EXAMINATION:    Constitutional: Well-nourished. No physical deformities. Normally developed. Good grooming.  Neck: Neck symmetrical, not swollen. Normal tracheal position.  Respiratory: No labored breathing, no use of accessory muscles.   Cardiovascular: Normal temperature, normal extremity pulses, no swelling, no varicosities.  Lymphatic: No enlargement of neck, axillae, groin.  Skin: No paleness, no jaundice, no cyanosis. No lesion, no ulcer, no rash.  Neurologic / Psychiatric:  Oriented to time, oriented to place, oriented to person. No depression, no anxiety, no agitation.  Gastrointestinal: No mass, no tenderness, no rigidity, non obese abdomen.  Eyes: Normal conjunctivae. Normal eyelids.  Ears, Nose, Mouth, and Throat: Left ear no scars, no lesions, no masses. Right ear no scars, no lesions, no masses. Nose no scars, no lesions, no masses. Normal hearing. Normal lips.  Musculoskeletal: Normal gait and station of head and neck.     Complexity of Data:  X-Ray Review: C.T. Abdomen/Pelvis: Reviewed Films. Discussed With Patient. 2025    PROCEDURES:         KUB - 74018  A single view of the abdomen is obtained.  Calculi:  4 mm left prox stone      The bones appeared normal. The bowel gas pattern appeared normal. The soft tissues were unremarkable. . Patient confirmed No Neulasta OnPro Device.           Visit Complexity - G2211 Chronic management         Urinalysis w/Scope - 81001 Dipstick Dipstick Cont'd Micro  Color: Yellow Bilirubin: Neg WBC/hpf: NS (Not Seen)  Appearance: Clear Ketones: Neg RBC/hpf: 0 - 2/hpf  Specific Gravity: 1.025 Blood: Trace Bacteria: Few (10-25/hpf)  pH: <=5.0 Protein: Neg Cystals: NS (Not Seen)  Glucose: 3+ Urobilinogen: 0.2 Casts: NS (Not Seen)    Nitrites: Neg Trichomonas: Not Present    Leukocyte Esterase: Neg Mucous: Present      Epithelial Cells: 6 - 10/hpf      Yeast: NS (Not Seen)      Sperm: Not Present    Notes:            Ketoralac 30mg  - J1885, [id number]  Pt L buttock was prepped with an alcohol swab. 15g was injected into L buttock and covered with a Bandaid. Pt tolerated well.  15mg  WASTED.   Qty: 15 Adm. By: Tinnie Staff  Unit: mg Adm. On: 07/28/2024 04:35 PM  Route: IM Lot No: [id number]  Freq: None Exp. 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 All questions answered. He will proceed with eswl. He wants to avoid a stent but understands the stone is on the smaller end of the visible spectrum and he may need a staged procedure.   PLAN:           Orders X-Rays: KUB          Schedule Return Visit/Planned Activity: ASAP - Schedule Surgery          Document Letter(s):  Created for Patient: Clinical Summary         Next Appointment:  Next Appointment: 07/29/2024 03:30 PM    Appointment Type: Surgery     Location: [facility] [phone]    Provider: Donnice Brooks, M.D.    Reason for Visit: LT ESWL      * Signed by Donnice Brooks, M.D. on 07/28/24 at 5:37 PM (EDT*      ADD: S/p LEFT ESWL 8/22 and then stented 8/24. Needs LEFT URS/HLL/STENT.

## 2024-08-26 ENCOUNTER — Ambulatory Visit (HOSPITAL_COMMUNITY): Admitting: Registered Nurse

## 2024-08-26 ENCOUNTER — Ambulatory Visit (HOSPITAL_COMMUNITY)

## 2024-08-26 ENCOUNTER — Encounter (HOSPITAL_COMMUNITY)
Admission: RE | Disposition: A | Discharge status: Home / Self Care | Payer: Self-pay | Source: Home / Self Care | Attending: Urology

## 2024-08-26 ENCOUNTER — Ambulatory Visit (HOSPITAL_COMMUNITY)
Admission: RE | Admit: 2024-08-26 | Discharge: 2024-08-26 | Disposition: A | Discharge status: Home / Self Care | Attending: Urology | Admitting: Urology

## 2024-08-26 ENCOUNTER — Encounter (HOSPITAL_COMMUNITY): Admitting: Registered Nurse

## 2024-08-26 ENCOUNTER — Encounter (HOSPITAL_COMMUNITY): Payer: Self-pay | Admitting: Urology

## 2024-08-26 DIAGNOSIS — M1990 Unspecified osteoarthritis, unspecified site: Secondary | ICD-10-CM | POA: Diagnosis not present

## 2024-08-26 DIAGNOSIS — N319 Neuromuscular dysfunction of bladder, unspecified: Secondary | ICD-10-CM | POA: Insufficient documentation

## 2024-08-26 DIAGNOSIS — Z419 Encounter for procedure for purposes other than remedying health state, unspecified: Secondary | ICD-10-CM

## 2024-08-26 DIAGNOSIS — K219 Gastro-esophageal reflux disease without esophagitis: Secondary | ICD-10-CM | POA: Diagnosis not present

## 2024-08-26 DIAGNOSIS — I10 Essential (primary) hypertension: Secondary | ICD-10-CM | POA: Insufficient documentation

## 2024-08-26 DIAGNOSIS — N201 Calculus of ureter: Secondary | ICD-10-CM | POA: Insufficient documentation

## 2024-08-26 DIAGNOSIS — Z7982 Long term (current) use of aspirin: Secondary | ICD-10-CM | POA: Insufficient documentation

## 2024-08-26 DIAGNOSIS — Z7984 Long term (current) use of oral hypoglycemic drugs: Secondary | ICD-10-CM | POA: Insufficient documentation

## 2024-08-26 DIAGNOSIS — E119 Type 2 diabetes mellitus without complications: Secondary | ICD-10-CM | POA: Diagnosis not present

## 2024-08-26 DIAGNOSIS — Z466 Encounter for fitting and adjustment of urinary device: Secondary | ICD-10-CM | POA: Insufficient documentation

## 2024-08-26 DIAGNOSIS — Z87891 Personal history of nicotine dependence: Secondary | ICD-10-CM | POA: Diagnosis not present

## 2024-08-26 HISTORY — PX: CYSTOSCOPY/URETEROSCOPY/HOLMIUM LASER/STENT PLACEMENT: SHX6546

## 2024-08-26 LAB — BASIC METABOLIC PANEL WITH GFR
Anion gap: 9 (ref 5–15)
BUN: 29 mg/dL — ABNORMAL HIGH (ref 8–23)
CO2: 22 mmol/L (ref 22–32)
Calcium: 9.2 mg/dL (ref 8.9–10.3)
Chloride: 107 mmol/L (ref 98–111)
Creatinine, Ser: 1.18 mg/dL (ref 0.61–1.24)
GFR, Estimated: >60 mL/min (ref >60)
Glucose, Bld: 126 mg/dL — ABNORMAL HIGH (ref 70–99)
Potassium: 4 mmol/L (ref 3.5–5.1)
Sodium: 138 mmol/L (ref 135–145)

## 2024-08-26 LAB — HEMOGLOBIN A1C
Hgb A1c MFr Bld: 6.5 % — ABNORMAL HIGH (ref 4.8–5.6)
Mean Plasma Glucose: 139.85 mg/dL

## 2024-08-26 LAB — GLUCOSE, CAPILLARY
Glucose-Capillary: 122 mg/dL — ABNORMAL HIGH (ref 70–99)
Glucose-Capillary: 111 mg/dL — ABNORMAL HIGH (ref 70–99)

## 2024-08-26 SURGERY — CYSTOSCOPY/URETEROSCOPY/HOLMIUM LASER/STENT PLACEMENT
Anesthesia: General | Laterality: Left

## 2024-08-26 MED ORDER — PROPOFOL 10 MG/ML IV BOLUS
INTRAVENOUS | Status: AC
  Filled 2024-08-26: qty 20

## 2024-08-26 MED ORDER — FENTANYL CITRATE (PF) 250 MCG/5ML IJ SOLN
INTRAMUSCULAR | Status: AC
  Filled 2024-08-26: qty 5

## 2024-08-26 MED ORDER — SODIUM CHLORIDE 0.9 % IV SOLN: INTRAVENOUS | Status: DC

## 2024-08-26 MED ORDER — SODIUM CHLORIDE 0.9 % IV SOLN
2.0000 g | INTRAVENOUS | Status: AC
Start: 2024-08-26 — End: 2024-08-26
  Administered 2024-08-26: 2 g via INTRAVENOUS
  Filled 2024-08-26: qty 20

## 2024-08-26 MED ORDER — FENTANYL CITRATE (PF) 250 MCG/5ML IJ SOLN
INTRAMUSCULAR | Status: DC | PRN
  Administered 2024-08-26 (×2): 50 ug via INTRAVENOUS
  Administered 2024-08-26: 100 ug via INTRAVENOUS

## 2024-08-26 MED ORDER — SODIUM CHLORIDE 0.9 % IR SOLN
Status: DC | PRN
  Administered 2024-08-26: 3000 mL via INTRAVESICAL

## 2024-08-26 MED ORDER — PROPOFOL 10 MG/ML IV BOLUS
INTRAVENOUS | Status: DC | PRN
  Administered 2024-08-26: 150 mg via INTRAVENOUS

## 2024-08-26 MED ORDER — INSULIN ASPART 100 UNIT/ML IJ SOLN: 0.0000 [IU] | INTRAMUSCULAR | Status: DC | PRN

## 2024-08-26 MED ORDER — CEFAZOLIN SODIUM-DEXTROSE 2-4 GM/100ML-% IV SOLN
INTRAVENOUS | Status: AC
  Filled 2024-08-26: qty 100

## 2024-08-26 MED ORDER — LIDOCAINE 2% (20 MG/ML) 5 ML SYRINGE
INTRAMUSCULAR | Status: AC
  Filled 2024-08-26: qty 5

## 2024-08-26 MED ORDER — CHLORHEXIDINE GLUCONATE 0.12 % MT SOLN: 15.0000 mL | Freq: Once | OROMUCOSAL | Status: AC

## 2024-08-26 MED ORDER — ONDANSETRON HCL 4 MG/2ML IJ SOLN
INTRAMUSCULAR | Status: AC
  Filled 2024-08-26: qty 2

## 2024-08-26 MED ORDER — LIDOCAINE 2% (20 MG/ML) 5 ML SYRINGE
INTRAMUSCULAR | Status: DC | PRN
  Administered 2024-08-26: 60 mg via INTRAVENOUS

## 2024-08-26 MED ORDER — CHLORHEXIDINE GLUCONATE 0.12 % MT SOLN
OROMUCOSAL | Status: AC
  Administered 2024-08-26: 15 mL via OROMUCOSAL
  Filled 2024-08-26: qty 15

## 2024-08-26 MED ORDER — FENTANYL CITRATE (PF) 100 MCG/2ML IJ SOLN
25.0000 ug | INTRAMUSCULAR | Status: DC | PRN
  Administered 2024-08-26: 25 ug via INTRAVENOUS

## 2024-08-26 MED ORDER — ACETAMINOPHEN 10 MG/ML IV SOLN
1000.0000 mg | Freq: Once | INTRAVENOUS | Status: DC | PRN
  Administered 2024-08-26: 1000 mg via INTRAVENOUS

## 2024-08-26 MED ORDER — ORAL CARE MOUTH RINSE: 15.0000 mL | Freq: Once | OROMUCOSAL | Status: AC

## 2024-08-26 MED ORDER — EPHEDRINE SULFATE-NACL 50-0.9 MG/10ML-% IV SOSY
PREFILLED_SYRINGE | INTRAVENOUS | Status: DC | PRN
  Administered 2024-08-26 (×2): 10 mg via INTRAVENOUS

## 2024-08-26 MED ORDER — FENTANYL CITRATE (PF) 100 MCG/2ML IJ SOLN
INTRAMUSCULAR | Status: AC
  Filled 2024-08-26: qty 2

## 2024-08-26 MED ORDER — ONDANSETRON HCL 4 MG/2ML IJ SOLN
INTRAMUSCULAR | Status: DC | PRN
  Administered 2024-08-26: 4 mg via INTRAVENOUS

## 2024-08-26 MED ORDER — ACETAMINOPHEN 10 MG/ML IV SOLN
INTRAVENOUS | Status: AC
  Filled 2024-08-26: qty 100

## 2024-08-26 SURGICAL SUPPLY — 21 items
BAG DRAIN URO-CYSTO SKYTR STRL (DRAIN) ×1 IMPLANT
CATH URETL OPEN 5X70 (CATHETERS) ×1 IMPLANT
CATH URETL OPEN END 6FR 70 (CATHETERS) IMPLANT
COVER DOME SNAP 22 D (MISCELLANEOUS) IMPLANT
GLOVE BIO SURGEON STRL SZ7.5 (GLOVE) ×1 IMPLANT
GLOVE BIOGEL PI IND STRL 7.0 (GLOVE) IMPLANT
GOWN STRL REUS W/ TWL LRG LVL3 (GOWN DISPOSABLE) IMPLANT
GOWN STRL SURGICAL XL XLNG (GOWN DISPOSABLE) ×1 IMPLANT
GUIDEWIRE ANG ZIPWIRE 038X150 (WIRE) IMPLANT
GUIDEWIRE ZIPWRE .038 STRAIGHT (WIRE) ×1 IMPLANT
HIBICLENS CHG 4% 4OZ (MISCELLANEOUS) IMPLANT
KIT TURNOVER KIT B (KITS) ×1 IMPLANT
MANIFOLD NEPTUNE II (INSTRUMENTS) ×1 IMPLANT
NS IRRIG 1000ML POUR BTL (IV SOLUTION) ×1 IMPLANT
PACK CYSTO (CUSTOM PROCEDURE TRAY) ×1 IMPLANT
SOL .9 NS 3000ML IRR UROMATIC (IV SOLUTION) ×2 IMPLANT
STENT CONTOUR 6FRX26X.038 (STENTS) IMPLANT
STENT URET 6FRX24 CONTOUR (STENTS) IMPLANT
STENT URET 6FRX26 CONTOUR (STENTS) IMPLANT
TUBE CONNECTING 12X1/4 (SUCTIONS) IMPLANT
TUBING UROLOGY SET (TUBING) ×1 IMPLANT

## 2024-08-26 NOTE — Progress Notes (Signed)
 75mcg Fentanyl  waste in stericycle by ONEIDA Solian RN and T Ogg RN.

## 2024-08-26 NOTE — Transfer of Care (Signed)
 Immediate Anesthesia Transfer of Care Note  Patient: Ralph Marquez  Procedure(s) Performed: CYSTOSCOPY/URETEROSCOPY/HOLMIUM LASER/STENT EXCHANGE (Left)  Patient Location: PACU  Anesthesia Type:General  Level of Consciousness: drowsy and patient cooperative  Airway & Oxygen Therapy: Patient connected to face mask oxygen  Post-op Assessment: Report given to RN and Post -op Vital signs reviewed and stable  Post vital signs: Reviewed and stable  Last Vitals:  Vitals Value Taken Time  BP 124/70 08/26/24 13:11  Temp    Pulse 71 08/26/24 13:14  Resp 15 08/26/24 13:14  SpO2 94 % 08/26/24 13:14  Vitals shown include unfiled device data.  Last Pain:  Vitals:   08/26/24 0956  TempSrc: Oral  PainSc: 7       Patients Stated Pain Goal: 5 (08/26/24 0956)  Complications: There were no known notable events for this encounter.

## 2024-08-26 NOTE — Anesthesia Preprocedure Evaluation (Signed)
 Anesthesia Evaluation  Patient identified by MRN, date of birth, ID band Patient awake    Reviewed: Allergy & Precautions, NPO status , Patient's Chart, lab work & pertinent test results  Airway Mallampati: II  TM Distance: >3 FB Neck ROM: Full    Dental no notable dental hx.    Pulmonary neg pulmonary ROS, former smoker   Pulmonary exam normal        Cardiovascular hypertension,  Rhythm:Regular Rate:Normal     Neuro/Psych negative neurological ROS  negative psych ROS   GI/Hepatic Neg liver ROS,GERD  ,,  Endo/Other  diabetes    Renal/GU Renal disease Bladder dysfunction      Musculoskeletal  (+) Arthritis , Osteoarthritis,    Abdominal Normal abdominal exam  (+)   Peds  Hematology negative hematology ROS (+)   Anesthesia Other Findings   Reproductive/Obstetrics                              Anesthesia Physical Anesthesia Plan  ASA: 2  Anesthesia Plan: General   Post-op Pain Management:    Induction: Intravenous  PONV Risk Score and Plan: 2 and Ondansetron , Dexamethasone , Treatment may vary due to age or medical condition and Midazolam   Airway Management Planned: Mask and LMA  Additional Equipment: None  Intra-op Plan:   Post-operative Plan: Extubation in OR  Informed Consent: I have reviewed the patients History and Physical, chart, labs and discussed the procedure including the risks, benefits and alternatives for the proposed anesthesia with the patient or authorized representative who has indicated his/her understanding and acceptance.     Dental advisory given  Plan Discussed with: CRNA  Anesthesia Plan Comments:         Anesthesia Quick Evaluation

## 2024-08-26 NOTE — Discharge Instructions (Addendum)
 Removal of the stent-remove the stent on Monday morning 08/29/2024 by pulling the string as instructed   Post Anesthesia Home Care Instructions  Activity: Get plenty of rest for the remainder of the day. A responsible individual must stay with you for 24 hours following the procedure.  For the next 24 hours, DO NOT: -Drive a car -Advertising copywriter -Drink alcoholic beverages -Take any medication unless instructed by your physician -Make any legal decisions or sign important papers.  Meals: Start with liquid foods such as gelatin or soup. Progress to regular foods as tolerated. Avoid greasy, spicy, heavy foods. If nausea and/or vomiting occur, drink only clear liquids until the nausea and/or vomiting subsides. Call your physician if vomiting continues.  Special Instructions/Symptoms: Your throat may feel dry or sore from the anesthesia or the breathing tube placed in your throat during surgery. If this causes discomfort, gargle with warm salt water . The discomfort should disappear within 24 hours.    No acetaminophen /Tylenol  until after 8 pm today if needed.

## 2024-08-26 NOTE — Anesthesia Procedure Notes (Addendum)
 Procedure Name: LMA Insertion Date/Time: 08/26/2024 12:17 PM  Performed by: Christopher Comings, CRNAPre-anesthesia Checklist: Patient identified, Emergency Drugs available, Suction available and Patient being monitored Patient Re-evaluated:Patient Re-evaluated prior to induction Oxygen Delivery Method: Circle system utilized Preoxygenation: Pre-oxygenation with 100% oxygen Induction Type: IV induction Ventilation: Mask ventilation without difficulty LMA: LMA inserted LMA Size: 5.0 Tube type: Oral Number of attempts: 1 Airway Equipment and Method: Bite block Placement Confirmation: positive ETCO2 and breath sounds checked- equal and bilateral Tube secured with: Tape Dental Injury: Teeth and Oropharynx as per pre-operative assessment

## 2024-08-26 NOTE — Interval H&P Note (Signed)
 History and Physical Interval Note:  08/26/2024 11:50 AM  Ralph Marquez  has presented today for surgery, with the diagnosis of LEFT URETERAL STONE.  The various methods of treatment have been discussed with the patient and family. After consideration of risks, benefits and other options for treatment, the patient has consented to  Procedure(s): CYSTOSCOPY/URETEROSCOPY/HOLMIUM LASER/STENT PLACEMENT (Left) as a surgical intervention.  The patient's history has been reviewed, patient examined, no change in status, stable for surgery.  I have reviewed the patient's chart and labs. Pt with hematuria and stent pain but no dysuria or fever. Again discussed role of stent in acute phase and rationale for staged procedure.  Questions were answered to the patient's satisfaction.     Donnice Brooks

## 2024-08-26 NOTE — Op Note (Signed)
 Preoperative diagnosis: Left ureteral stone Postoperative diagnosis: Same  Procedure: Cystoscopy left ureteroscopy laser lithotripsy, stone basket extraction and left ureteral stent exchange  Surgeon: Nieves  Anesthesia: General  Indication for procedure: Ralph Marquez is a 68 year old male with a symptomatic left ureteral stone.  He underwent left ESWL and the stone did not completely fragment and he had continued severe pain postoperatively.  Therefore he underwent a cystoscopy and left ureteral stent placement.  He is brought today for stone management.  Findings: The meatus and the glans were normal without lesion.  On cystoscopy the urethra was unremarkable prostatic urethra nonobstructive but a high bladder neck.  No stone or foreign body in the bladder.  Left ureteral stent in place.  No mucosal lesions noted.  On left ureteroscopy the stone was located in the left distal ureter to have had about a third of the stone fragmented from the prior ESWL.  Description of procedure: After consent was obtained patient brought to the operating room.  After adequate anesthesia he is placed in the lithotomy position and prepped and draped in the usual sterile covering the patient and procedure.  The cystoscope was passed per urethra and the bladder inspected.  The left ureteral stent was grasped and removed through the urethral meatus.  A sensor wire was passed into the lumen of the stent up into the left calyx where it was coiled in the upper pole.  The stent was completely removed.  Semirigid ureteroscope was advanced adjacent to the wire and into the left ureter where the stone was located.  It was thought to be just to be able to reliably grasped and removed intact and therefore 242 m laser fiber was passed was fragmented down to 2 fragment.  Segments were removed with a ZeroTip basket but only about 2 to 3 mm each.  Inspection up into the mid ureter noted there to be no other fragments and I could see  well up the ureter noted no fragments.  The scope was backed out and there was injury noted.  Backloaded on the cystoscope and a 626 cm stent advanced.  The wire was removed with a good coil seen up in the kidney and a good coil bladder.  Then the scope removed.  He was awakened and taken to the cover room in stable condition.  Complications: None  Blood loss: Minimal  Drains: 6 x 26 cm left ureteral stent with tether  Specimens: None  Disposition: Patient stable to PACU.  I called and went over the procedure, postop care and follow-up with Ralph Marquez.

## 2024-08-27 NOTE — Anesthesia Postprocedure Evaluation (Signed)
 Anesthesia Post Note  Patient: [name]  Procedure(s) Performed: CYSTOSCOPY/URETEROSCOPY/HOLMIUM LASER/STENT EXCHANGE (Left)     Patient location during evaluation: PACU Anesthesia Type: General Level of consciousness: awake and alert Pain management: pain level controlled Vital Signs Assessment: post-procedure vital signs reviewed and stable Respiratory status: spontaneous breathing, nonlabored ventilation, respiratory function stable and patient connected to nasal cannula oxygen Cardiovascular status: blood pressure returned to baseline and stable Postop Assessment: no apparent nausea or vomiting Anesthetic complications: no   There were no known notable events for this encounter.  Last Vitals:  Vitals:   08/26/24 1416 08/26/24 1440  BP:  130/78  Pulse:  68  Resp:  18  Temp:  36.4 C  SpO2: 97% 96%    Last Pain:  Vitals:   08/26/24 1440  TempSrc:   PainSc: 3                  Yolando Gillum P Melyna Huron

## 2024-08-28 ENCOUNTER — Encounter (HOSPITAL_COMMUNITY): Payer: Self-pay | Admitting: Urology

## 2024-09-01 ENCOUNTER — Telehealth: Payer: Self-pay

## 2024-09-01 ENCOUNTER — Other Ambulatory Visit: Payer: Self-pay

## 2024-09-01 DIAGNOSIS — N1831 Chronic kidney disease, stage 3a: Secondary | ICD-10-CM

## 2024-09-01 DIAGNOSIS — N200 Calculus of kidney: Secondary | ICD-10-CM

## 2024-09-01 DIAGNOSIS — I10 Essential (primary) hypertension: Secondary | ICD-10-CM

## 2024-09-01 DIAGNOSIS — E782 Mixed hyperlipidemia: Secondary | ICD-10-CM

## 2024-09-01 MED ORDER — SPIRONOLACTONE 25 MG PO TABS: 12.5000 mg | ORAL_TABLET | Freq: Every day | ORAL | 3 refills | Status: DC

## 2024-09-01 MED ORDER — DILTIAZEM HCL ER COATED BEADS 360 MG PO CP24
360.0000 mg | ORAL_CAPSULE | Freq: Every day | ORAL | 3 refills | Status: AC
Start: 2024-09-01 — End: ?

## 2024-09-01 MED ORDER — PANTOPRAZOLE SODIUM 40 MG PO TBEC: 40.0000 mg | DELAYED_RELEASE_TABLET | Freq: Two times a day (BID) | ORAL | 2 refills | Status: DC

## 2024-09-01 MED ORDER — TAMSULOSIN HCL 0.4 MG PO CAPS: 0.4000 mg | ORAL_CAPSULE | Freq: Every day | ORAL | 0 refills | Status: DC

## 2024-09-01 MED ORDER — EMPAGLIFLOZIN 10 MG PO TABS: 10.0000 mg | ORAL_TABLET | Freq: Every day | ORAL | 3 refills | Status: AC

## 2024-09-01 MED ORDER — PRAVASTATIN SODIUM 20 MG PO TABS: 20.0000 mg | ORAL_TABLET | Freq: Every day | ORAL | 3 refills | Status: DC

## 2024-09-01 NOTE — Telephone Encounter (Signed)
 Patient will need med refills up until see gloria in January next opening. (Patient is requesting 90 day supply with 3 refills.  diltiazem  (CARDIZEM  CD) 360 MG 24 hr capsule   empagliflozin  (JARDIANCE ) 10 MG TABS tablet   pantoprazole  (PROTONIX ) 40 MG tablet   pravastatin  (PRAVACHOL ) 20 MG tablet   triazolam  (HALCION ) 0.25 MG tablet [[id number]]   Vitamin D , Ergocalciferol , (DRISDOL ) 1.25 MG (50000 UNIT) CAPS capsule [[id number]]   tamsulosin  (FLOMAX ) 0.4 MG CAPS capsule [[id number]   spironolactone  (ALDACTONE ) 25 MG tablet [[id number]]   Pharmacy: [facility]

## 2024-09-07 ENCOUNTER — Ambulatory Visit
Admission: RE | Admit: 2024-09-07 | Discharge: 2024-09-07 | Disposition: A | Source: Ambulatory Visit | Attending: Student

## 2024-09-07 ENCOUNTER — Ambulatory Visit
Admission: RE | Admit: 2024-09-07 | Discharge: 2024-09-07 | Disposition: A | Discharge status: Home / Self Care | Source: Ambulatory Visit | Attending: Student | Admitting: Student

## 2024-09-07 ENCOUNTER — Other Ambulatory Visit: Payer: Self-pay | Admitting: Family Medicine

## 2024-09-07 DIAGNOSIS — M1712 Unilateral primary osteoarthritis, left knee: Secondary | ICD-10-CM | POA: Diagnosis not present

## 2024-09-07 DIAGNOSIS — E559 Vitamin D deficiency, unspecified: Secondary | ICD-10-CM

## 2024-09-07 DIAGNOSIS — Z96653 Presence of artificial knee joint, bilateral: Secondary | ICD-10-CM | POA: Diagnosis not present

## 2024-09-07 DIAGNOSIS — Z96652 Presence of left artificial knee joint: Secondary | ICD-10-CM | POA: Diagnosis not present

## 2024-09-07 DIAGNOSIS — Z471 Aftercare following joint replacement surgery: Secondary | ICD-10-CM | POA: Diagnosis not present

## 2024-09-07 MED ORDER — TECHNETIUM TC 99M MEDRONATE IV KIT
20.0000 | PACK | Freq: Once | INTRAVENOUS | Status: AC | PRN
  Administered 2024-09-07: 20.84 via INTRAVENOUS

## 2024-09-08 NOTE — Telephone Encounter (Signed)
 Patient scheduled 12/27/24

## 2024-09-21 ENCOUNTER — Other Ambulatory Visit: Payer: Self-pay | Admitting: Surgery

## 2024-09-21 DIAGNOSIS — M1612 Unilateral primary osteoarthritis, left hip: Secondary | ICD-10-CM

## 2024-09-21 DIAGNOSIS — Z96652 Presence of left artificial knee joint: Secondary | ICD-10-CM | POA: Diagnosis not present

## 2024-09-26 ENCOUNTER — Ambulatory Visit: Admitting: Family Medicine

## 2024-09-26 ENCOUNTER — Encounter: Payer: Self-pay | Admitting: Family Medicine

## 2024-09-26 VITALS — BP 110/74 | HR 76 | Resp 16 | Ht 70.0 in | Wt 212.4 lb

## 2024-09-26 DIAGNOSIS — F5104 Psychophysiologic insomnia: Secondary | ICD-10-CM

## 2024-09-26 NOTE — Progress Notes (Signed)
 Established Patient Office Visit  Subjective:  Patient ID: Ralph Marquez, male    DOB: [birth year]  Age: 68 y.o. MRN: [id number]  CC: No chief complaint on file.   HPI Ralph Marquez is a 68 y.o. 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  For the details of today's visit, please refer to the assessment and plan.      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   s/p  08-25-2018  IR embolization cerebral coilling x3   CKD (chronic kidney disease), stage III (HCC)    GERD (gastroesophageal reflux disease)    Hyperlipidemia, mixed    Hypertension    Left flank pain    Left ureteral calculus 07/21/2024   OA (osteoarthritis)    Psychophysiologic insomnia 01/29/2024   Renal calculi 07/21/2024   bilateral   Type 2 diabetes mellitus (HCC)    followed by pcp   Wears dentures    full upper   Wears glasses     Past Surgical History:  Procedure Laterality Date   CEREBRAL EMBOLIZATION  08/25/2018   @[facility];   x3  coiling superiorly directed Acomm aneurysm   CYSTOSCOPY/URETEROSCOPY/HOLMIUM LASER/STENT PLACEMENT Left 07/31/2024   Procedure: CYSTOSCOPY/URETEROSCOPY/HOLMIUM LASER/STENT PLACEMENT;  Surgeon: Shane Steffan BROCKS, MD;  Location: [facility];  Service: Urology;  Laterality: Left;   CYSTOSCOPY/URETEROSCOPY/HOLMIUM LASER/STENT PLACEMENT Left 08/26/2024   Procedure: CYSTOSCOPY/URETEROSCOPY/HOLMIUM LASER/STENT EXCHANGE;  Surgeon: Nieves Cough, MD;  Location: [facility] OR;  Service: Urology;  Laterality: Left;   ESOPHAGOGASTRODUODENOSCOPY N/A 07/30/2017   Procedure: ESOPHAGOGASTRODUODENOSCOPY (EGD) Possible esophageal dilation.;  Surgeon: Golda Claudis PENNER, MD;  Location: [facility];  Service: Endoscopy;  Laterality: N/A;   EXTRACORPOREAL SHOCK WAVE LITHOTRIPSY Left 07/29/2024   Procedure: LITHOTRIPSY, ESWL;   Surgeon: Nieves Cough, MD;  Location: [facility];  Service: Urology;  Laterality: Left;   ROTATOR CUFF REPAIR W/ DISTAL CLAVICLE EXCISION  08/03/2003   @[facility]  by dr harden   TOTAL KNEE ARTHROPLASTY Left 02/09/2024   Procedure: TOTAL KNEE ARTHROPLASTY;  Surgeon: Edie Norleen PARAS, MD;  Location: [facility] ORS;  Service: Orthopedics;  Laterality: Left;   TOTAL KNEE ARTHROPLASTY Right 2016   @[facility]     Family History  Problem Relation Age of Onset   CAD Mother        CABG in 10's   CVA Sister     Social History   Socioeconomic History   Marital status: Married    Spouse name: Ralph Marquez   Number of children: Not on file   Years of education: Not on file   Highest education level: Not on file  Occupational History   Not on file  Tobacco Use   Smoking status: Former    Types: Cigarettes   Smokeless tobacco: Never   Tobacco comments:    07-28-2024  pt quit smoking 2005  Vaping Use   Vaping status: Never Used  Substance and Sexual Activity   Alcohol use: No   Drug use: Never   Sexual activity: Not on file  Other Topics Concern   Not on file  Social History Narrative   Not on file   Social Drivers of Health   Financial Resource Strain: Low Risk  (06/08/2024)   [facility] from [facility]   Overall Financial Resource Strain (CARDIA)    Difficulty of Paying Living Expenses: Not hard at all  Food  Insecurity: No Food Insecurity (07/31/2024)   Hunger Vital Sign    Worried About Running Out of Food in the Last Year: Never true    Ran Out of Food in the Last Year: Never true  Transportation Needs: No Transportation Needs (07/31/2024)   PRAPARE - Administrator, Civil Service (Medical): No    Lack of Transportation (Non-Medical): No  Physical Activity: Not on file  Stress: Not on file  Social Connections: Moderately Isolated (07/31/2024)   Social Connection and Isolation Panel    Frequency of Communication with Friends and Family: Twice a week    Frequency of Social  Gatherings with Friends and Family: Once a week    Attends Religious Services: Never    Database administrator or Organizations: No    Attends Banker Meetings: Never    Marital Status: Married  Catering manager Violence: Not At Risk (07/31/2024)   Humiliation, Afraid, Rape, and Kick questionnaire    Fear of Current or Ex-Partner: No    Emotionally Abused: No    Physically Abused: No    Sexually Abused: No    Outpatient Medications Prior to Visit  Medication Sig Dispense Refill   diltiazem  (CARDIZEM  CD) 360 MG 24 hr capsule Take 1 capsule (360 mg total) by mouth daily. 90 capsule 3   empagliflozin  (JARDIANCE ) 10 MG TABS tablet Take 1 tablet (10 mg total) by mouth daily. 100 tablet 3   oxyCODONE  (ROXICODONE ) 5 MG immediate release tablet Take 1-2 tablets (5-10 mg total) by mouth every 4 (four) hours as needed for moderate pain (pain score 4-6) or severe pain (pain score 7-10). 40 tablet 0   pantoprazole  (PROTONIX ) 40 MG tablet Take 1 tablet (40 mg total) by mouth 2 (two) times daily before a meal. 60 tablet 2   pravastatin  (PRAVACHOL ) 20 MG tablet Take 1 tablet (20 mg total) by mouth daily. 90 tablet 3   spironolactone  (ALDACTONE ) 25 MG tablet Take 0.5 tablets (12.5 mg total) by mouth daily. 45 tablet 3   tamsulosin  (FLOMAX ) 0.4 MG CAPS capsule Take 1 capsule (0.4 mg total) by mouth daily. (Patient not taking: Reported on 09/26/2024) 5 capsule 0   triazolam  (HALCION ) 0.25 MG tablet Take 1 tablet (0.25 mg total) by mouth at bedtime as needed for sleep. (Patient taking differently: Take 0.25 mg by mouth at bedtime as needed for sleep.) 30 tablet 2   Vitamin D , Ergocalciferol , (DRISDOL ) 1.25 MG (50000 UNIT) CAPS capsule Take 1 capsule (50,000 Units total) by mouth every 7 (seven) days. (Patient taking differently: Take 50,000 Units by mouth every 7 (seven) days. Thursday's) 12 capsule 1   No facility-administered medications prior to visit.    Allergies  Allergen Reactions    Amlodipine  Hives   Lisinopril Hives   Losartan Potassium Hives   Betadine [Povidone Iodine] Rash    ROS Review of Systems  Constitutional:  Negative for fatigue and fever.  Eyes:  Negative for visual disturbance.  Respiratory:  Negative for chest tightness and shortness of breath.   Cardiovascular:  Negative for chest pain and palpitations.  Neurological:  Negative for dizziness and headaches.      Objective:    Physical Exam HENT:     Head: Normocephalic.     Right Ear: External ear normal.     Left Ear: External ear normal.     Nose: No congestion or rhinorrhea.     Mouth/Throat:     Mouth: Mucous membranes are moist.  Cardiovascular:  Rate and Rhythm: Regular rhythm.     Heart sounds: No murmur heard. Pulmonary:     Effort: No respiratory distress.     Breath sounds: Normal breath sounds.  Neurological:     Mental Status: He is alert.     BP 110/74   Pulse 76   Resp 16   Ht 5' 10 (1.778 m)   Wt 212 lb 6.4 oz (96.3 kg)   SpO2 94%   BMI 30.48 kg/m  Wt Readings from Last 3 Encounters:  09/26/24 212 lb 6.4 oz (96.3 kg)  08/26/24 215 lb 11.2 oz (97.8 kg)  08/16/24 213 lb (96.6 kg)    Lab Results  Component Value Date   TSH 1.410 02/05/2024   Lab Results  Component Value Date   WBC 16.1 (H) 08/01/2024   HGB 12.9 (L) 08/01/2024   HCT 39.9 08/01/2024   MCV 92.1 08/01/2024   PLT 321 08/01/2024   Lab Results  Component Value Date   NA 138 08/26/2024   K 4.0 08/26/2024   CO2 22 08/26/2024   GLUCOSE 126 (H) 08/26/2024   BUN 29 (H) 08/26/2024   CREATININE 1.18 08/26/2024   BILITOT 0.4 08/16/2024   ALKPHOS 99 08/16/2024   AST 15 08/16/2024   ALT 23 08/16/2024   PROT 7.6 08/16/2024   ALBUMIN 4.4 08/16/2024   CALCIUM 9.2 08/26/2024   ANIONGAP 9 08/26/2024   EGFR 57 (L) 08/16/2024   Lab Results  Component Value Date   CHOL 134 02/05/2024   Lab Results  Component Value Date   HDL 33 (L) 02/05/2024   Lab Results  Component Value Date    LDLCALC 85 02/05/2024   Lab Results  Component Value Date   TRIG 79 02/05/2024   Lab Results  Component Value Date   CHOLHDL 4.1 02/05/2024   Lab Results  Component Value Date   HGBA1C 6.5 (H) 08/26/2024      Assessment & Plan:  Psychophysiologic insomnia Assessment & Plan: The patient is currently taking Percocet 5-325 for chronic left knee pain. PDMP was reviewed, and the patient's last filled prescription for triazolam  was on 09/21/2024.  I informed the patient that I can only refill triazolam  monthly, providing 20 tablets, due to the risks associated with chronic use of narcotics and benzodiazepines. I reviewed the potential side effects of combining benzodiazepines and opioids, which include sedation, respiratory depression, and increased risk of overdose.  The patient insisted on receiving 30 tablets of triazolam  to take nightly for sleep. I discussed non-pharmacological interventions for insomnia and explored non-benzodiazepine alternatives, emphasizing the importance of minimizing sedative use due to safety concerns.     Note: This chart has been completed using Engineer, civil (consulting) software, and while attempts have been made to ensure accuracy, certain words and phrases may not be transcribed as intended.   Follow-up: No follow-ups on file.   Kairah Leoni  Z Bacchus, FNP

## 2024-09-26 NOTE — Patient Instructions (Addendum)
 I appreciate the opportunity to provide care to you today!    For a Healthier YOU, I Recommend: Reducing your intake of sugar, sodium, carbohydrates, and saturated fats. Increasing your fiber intake by incorporating more whole grains, fruits, and vegetables into your meals. Setting healthy goals with a focus on lowering your consumption of carbs, sugar, and unhealthy fats. Adding variety to your diet by including a wide range of fruits and vegetables. Cutting back on soda and limiting processed foods as much as possible. Staying active: In addition to taking your weight loss medication, aim for at least 150 minutes of moderate-intensity physical activity each week for optimal results.  Schedule for medicare annual wellness visit    Please follow up if your symptoms worsen or fail to improve.  Please continue to a heart-healthy diet and increase your physical activities. Try to exercise for at least five days a week.    It was a pleasure to see you and I look forward to continuing to work together on your health and well-being. Please do not hesitate to call the office if you need care or have questions about your care.  In case of emergency, please visit the Emergency Department for urgent care, or contact our clinic at [phone] to schedule an appointment. We're here to help you!   Have a wonderful day and week. With Gratitude, Meade JENEANE Gerlach MSN, FNP-BC, PMHNP-BC

## 2024-09-26 NOTE — Assessment & Plan Note (Signed)
 The patient is currently taking Percocet 5-325 for chronic left knee pain. PDMP was reviewed, and the patient's last filled prescription for triazolam  was on 09/21/2024.  I informed the patient that I can only refill triazolam  monthly, providing 20 tablets, due to the risks associated with chronic use of narcotics and benzodiazepines. I reviewed the potential side effects of combining benzodiazepines and opioids, which include sedation, respiratory depression, and increased risk of overdose.  The patient insisted on receiving 30 tablets of triazolam  to take nightly for sleep. I discussed non-pharmacological interventions for insomnia and explored non-benzodiazepine alternatives, emphasizing the importance of minimizing sedative use due to safety concerns.

## 2024-09-27 ENCOUNTER — Ambulatory Visit
Admission: RE | Admit: 2024-09-27 | Discharge: 2024-09-27 | Disposition: A | Discharge status: Home / Self Care | Source: Ambulatory Visit | Attending: Surgery | Admitting: Surgery

## 2024-09-27 DIAGNOSIS — M1612 Unilateral primary osteoarthritis, left hip: Secondary | ICD-10-CM | POA: Insufficient documentation

## 2024-10-03 ENCOUNTER — Telehealth: Payer: Self-pay | Admitting: *Deleted

## 2024-10-03 DIAGNOSIS — C7951 Secondary malignant neoplasm of bone: Secondary | ICD-10-CM | POA: Insufficient documentation

## 2024-10-03 NOTE — Telephone Encounter (Signed)
 Dr. Edie and his scan  pelvis , and hip. He feels that he may have cancer. He would like to get appt. For the guy. I called to the office and let them know to send referral with last notes, scan, and demographics. Fax number is [phone]. His nurse took  the info and will send it in,

## 2024-10-04 ENCOUNTER — Ambulatory Visit (INDEPENDENT_AMBULATORY_CARE_PROVIDER_SITE_OTHER)

## 2024-10-04 VITALS — BP 143/89 | HR 85 | Ht 70.0 in | Wt 217.0 lb

## 2024-10-04 DIAGNOSIS — Z125 Encounter for screening for malignant neoplasm of prostate: Secondary | ICD-10-CM

## 2024-10-04 DIAGNOSIS — F5104 Psychophysiologic insomnia: Secondary | ICD-10-CM

## 2024-10-04 DIAGNOSIS — C7951 Secondary malignant neoplasm of bone: Secondary | ICD-10-CM

## 2024-10-04 NOTE — Progress Notes (Signed)
 Established Patient Office Visit  Subjective   Patient ID: Ralph Marquez, male    DOB: [birth year]  Age: 68 y.o. MRN: [id number]  Chief Complaint  Patient presents with   Medical Management of Chronic Issues    Follow up   HPI Discussed the use of AI scribe software for clinical note transcription with the patient, who gave verbal consent to proceed.  History of Present Illness   Ralph Marquez is a 68 year old male who presents for follow-up and sleep medication refill.  Insomnia and sleep disturbance - Chronic difficulty with sleep, ongoing since the passing of his daughter - Long-term use of triazolam  for sleep initiation and maintenance - Previous reduction in triazolam  prescription from 30 to 20 pills, which is insufficient for current needs - Increased stress and pain contributing to sleep disturbance - Trial of multiple alternative sleep aids, including over-the-counter and prescription medications, which were ineffective or caused adverse effects such as hallucinations  Left hip and back pain - Persistent left hip pain for the past six months - Significant pain in both hip and back, initially attributed to kidney stones - Recent MRI of the left hip (performed two days ago) revealed three bone lesions with concern for possible malignancy - Currently taking pain medication prescribed by Dr. Edie, which provides minimal relief but allows for basic functioning and driving - History of kidney stones requiring three procedures, with hip pain persisting after resolution of nephrolithiasis  Oncologic concerns - No personal history of cancer - Family history significant for cancer; brother died from widely metastatic cancer - Concern regarding current bone lesions in the context of family history      Patient Active Problem List   Diagnosis Date Noted   Metastatic cancer to bone (HCC) 10/03/2024   Other fatigue 08/16/2024   AKI (acute kidney injury) 07/31/2024   Class 1  obesity 07/31/2024   Nephrolithiasis 07/31/2024   Pancreatic lesion 07/27/2024   Kidney stone 07/27/2024   Hepatic steatosis 07/27/2024   Left lower quadrant abdominal pain 07/27/2024   Primary osteoarthritis of left hip 07/25/2024   Chronic kidney disease 05/20/2024   Chronic kidney disease, stage 3a (HCC) 05/20/2024   Vitamin D  deficiency 05/20/2024   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, left 02/09/2024   Essential hypertension 01/29/2024   Mixed hyperlipidemia 01/29/2024   GERD (gastroesophageal reflux disease) 01/29/2024   Psychophysiologic insomnia 01/29/2024   Primary osteoarthritis of left knee 01/29/2024   Anterior communicating artery aneurysm 01/29/2024   Family history of colon cancer 01/29/2024   Cerebral aneurysm 08/25/2018    ROS    Objective:     BP (!) 143/89   Pulse 85   Ht 5' 10 (1.778 m)   Wt 217 lb (98.4 kg)   SpO2 95%   BMI 31.14 kg/m  BP Readings from Last 3 Encounters:  10/07/24 (!) 136/99  10/04/24 (!) 143/89  09/26/24 110/74   Wt Readings from Last 3 Encounters:  10/07/24 216 lb (98 kg)  10/04/24 217 lb (98.4 kg)  09/26/24 212 lb 6.4 oz (96.3 kg)     Physical Exam Vitals and nursing note reviewed.  Constitutional:      Appearance: Normal appearance.  HENT:     Head: Normocephalic.  Eyes:     Extraocular Movements: Extraocular movements intact.     Pupils: Pupils are equal, round, and reactive to light.  Cardiovascular:     Rate and Rhythm: Normal rate and regular rhythm.  Pulmonary:     Effort: Pulmonary effort is normal.     Breath sounds: Normal breath sounds.  Musculoskeletal:     Cervical back: Normal range of motion and neck supple.  Neurological:     Mental Status: He is alert and oriented to person, place, and time.  Psychiatric:        Mood and Affect: Mood normal.        Thought Content: Thought content normal.       Last CBC Lab Results  Component Value Date   WBC 11.3 (H) 10/07/2024   HGB  15.1 10/07/2024   HCT 46.9 10/07/2024   MCV 91.4 10/07/2024   MCH 29.4 10/07/2024   RDW 13.6 10/07/2024   PLT 348 10/07/2024   Last metabolic panel Lab Results  Component Value Date   GLUCOSE 109 (H) 10/07/2024   NA 138 10/07/2024   K 4.0 10/07/2024   CL 105 10/07/2024   CO2 20 (L) 10/07/2024   BUN 28 (H) 10/07/2024   CREATININE 1.35 (H) 10/07/2024   GFRNONAA 58 (L) 10/07/2024   CALCIUM 9.3 10/07/2024   PROT 8.1 10/07/2024   ALBUMIN 4.2 10/07/2024   LABGLOB 3.2 08/16/2024   BILITOT 0.7 10/07/2024   ALKPHOS 77 10/07/2024   AST 23 10/07/2024   ALT 30 10/07/2024   ANIONGAP 13 10/07/2024   Last lipids Lab Results  Component Value Date   CHOL 134 02/05/2024   HDL 33 (L) 02/05/2024   LDLCALC 85 02/05/2024   TRIG 79 02/05/2024   CHOLHDL 4.1 02/05/2024   Last hemoglobin A1c Lab Results  Component Value Date   HGBA1C 6.5 (H) 08/26/2024   Last thyroid  functions Lab Results  Component Value Date   TSH 1.410 02/05/2024   FREET4 1.61 02/05/2024   Last vitamin D  Lab Results  Component Value Date   VD25OH 34.3 05/20/2024   Last vitamin B12 and Folate Lab Results  Component Value Date   VITAMINB12 347 02/05/2024   FOLATE 5.6 02/05/2024     The 10-year ASCVD risk score (Arnett DK, et al., 2019) is: 32.3%    Assessment & Plan:   Problem List Items Addressed This Visit       Musculoskeletal and Integument   Metastatic cancer to bone [facility])   MRI showed three bone lesions suggestive of metastatic bone cancer. Scheduled for oncological evaluation. PET scan likely needed. He is anxious and in significant pain. - Order PSA test to evaluate for prostate cancer. - Coordinate with oncologist for further evaluation and management. - Provide MRI report to him.        Other   Psychophysiologic insomnia - Primary   Long-term triazolam  use for insomnia. Stress from potential cancer diagnosis worsens insomnia. Open to alternatives but not ready to taper off triazolam . -  Continue triazolam  with 30 tablets per month. - Discuss potential alternatives for sleep aids in the future.      Other Visit Diagnoses       Screening for prostate cancer       - Follow up with oncologist for further evaluation and management. - PSA  results pending.   Relevant Orders   PSA (Completed)         No follow-ups on file.    [name] Longs, FNP

## 2024-10-05 LAB — PSA: Prostate Specific Ag, Serum: 0.7 ng/mL (ref 0.0–4.0)

## 2024-10-06 ENCOUNTER — Ambulatory Visit: Payer: Self-pay

## 2024-10-07 ENCOUNTER — Other Ambulatory Visit: Payer: Self-pay | Admitting: *Deleted

## 2024-10-07 ENCOUNTER — Inpatient Hospital Stay

## 2024-10-07 ENCOUNTER — Encounter: Payer: Self-pay | Admitting: Oncology

## 2024-10-07 ENCOUNTER — Inpatient Hospital Stay: Attending: Oncology | Admitting: Oncology

## 2024-10-07 VITALS — BP 136/99 | HR 79 | Temp 97.6°F | Resp 16 | Ht 70.0 in | Wt 216.0 lb

## 2024-10-07 DIAGNOSIS — I129 Hypertensive chronic kidney disease with stage 1 through stage 4 chronic kidney disease, or unspecified chronic kidney disease: Secondary | ICD-10-CM | POA: Diagnosis not present

## 2024-10-07 DIAGNOSIS — Z7984 Long term (current) use of oral hypoglycemic drugs: Secondary | ICD-10-CM | POA: Insufficient documentation

## 2024-10-07 DIAGNOSIS — Z79899 Other long term (current) drug therapy: Secondary | ICD-10-CM | POA: Diagnosis not present

## 2024-10-07 DIAGNOSIS — M899 Disorder of bone, unspecified: Secondary | ICD-10-CM | POA: Insufficient documentation

## 2024-10-07 DIAGNOSIS — D72829 Elevated white blood cell count, unspecified: Secondary | ICD-10-CM | POA: Diagnosis not present

## 2024-10-07 DIAGNOSIS — C7951 Secondary malignant neoplasm of bone: Secondary | ICD-10-CM

## 2024-10-07 DIAGNOSIS — Z87891 Personal history of nicotine dependence: Secondary | ICD-10-CM | POA: Insufficient documentation

## 2024-10-07 DIAGNOSIS — N183 Chronic kidney disease, stage 3 unspecified: Secondary | ICD-10-CM | POA: Diagnosis not present

## 2024-10-07 DIAGNOSIS — E1122 Type 2 diabetes mellitus with diabetic chronic kidney disease: Secondary | ICD-10-CM | POA: Diagnosis not present

## 2024-10-07 LAB — CBC WITH DIFFERENTIAL/PLATELET
Abs Immature Granulocytes: 0.08 K/uL — ABNORMAL HIGH (ref 0.00–0.07)
Basophils Absolute: 0.1 K/uL (ref 0.0–0.1)
Basophils Relative: 1 %
Eosinophils Absolute: 0.2 K/uL (ref 0.0–0.5)
Eosinophils Relative: 1 %
HCT: 46.9 % (ref 39.0–52.0)
Hemoglobin: 15.1 g/dL (ref 13.0–17.0)
Immature Granulocytes: 1 %
Lymphocytes Relative: 17 %
Lymphs Abs: 1.9 K/uL (ref 0.7–4.0)
MCH: 29.4 pg (ref 26.0–34.0)
MCHC: 32.2 g/dL (ref 30.0–36.0)
MCV: 91.4 fL (ref 80.0–100.0)
Monocytes Absolute: 0.7 K/uL (ref 0.1–1.0)
Monocytes Relative: 6 %
Neutro Abs: 8.4 K/uL — ABNORMAL HIGH (ref 1.7–7.7)
Neutrophils Relative %: 74 %
Platelets: 348 K/uL (ref 150–400)
RBC: 5.13 MIL/uL (ref 4.22–5.81)
RDW: 13.6 % (ref 11.5–15.5)
WBC: 11.3 K/uL — ABNORMAL HIGH (ref 4.0–10.5)
nRBC: 0 % (ref 0.0–0.2)

## 2024-10-07 LAB — COMPREHENSIVE METABOLIC PANEL WITH GFR
ALT: 30 U/L (ref 0–44)
AST: 23 U/L (ref 15–41)
Albumin: 4.2 g/dL (ref 3.5–5.0)
Alkaline Phosphatase: 77 U/L (ref 38–126)
Anion gap: 13 (ref 5–15)
BUN: 28 mg/dL — ABNORMAL HIGH (ref 8–23)
CO2: 20 mmol/L — ABNORMAL LOW (ref 22–32)
Calcium: 9.3 mg/dL (ref 8.9–10.3)
Chloride: 105 mmol/L (ref 98–111)
Creatinine, Ser: 1.35 mg/dL — ABNORMAL HIGH (ref 0.61–1.24)
GFR, Estimated: 58 mL/min — ABNORMAL LOW (ref >60)
Glucose, Bld: 109 mg/dL — ABNORMAL HIGH (ref 70–99)
Potassium: 4 mmol/L (ref 3.5–5.1)
Sodium: 138 mmol/L (ref 135–145)
Total Bilirubin: 0.7 mg/dL (ref 0.0–1.2)
Total Protein: 8.1 g/dL (ref 6.5–8.1)

## 2024-10-07 LAB — LACTATE DEHYDROGENASE: LDH: 85 U/L — ABNORMAL LOW (ref 98–192)

## 2024-10-07 MED ORDER — OXYCODONE HCL 10 MG PO TABS: 10.0000 mg | ORAL_TABLET | ORAL | 0 refills | Status: DC | PRN

## 2024-10-07 NOTE — Progress Notes (Signed)
 Patient is having groin, knee, and lower back pain, average pain is at a 8.  He has been having this heavy feeling in his chest, mostly at night, he feels like he can't catch his breath.   March 4th had knee surgery.

## 2024-10-07 NOTE — Progress Notes (Signed)
 [facility]  [phone] [phone]  ID: Ralph Marquez OB: December 19, 1955  MR#: [id number]  RDW#:[id number]  Patient Care Team: Bevely Doffing, FNP as PCP - General (Family Medicine)  CHIEF COMPLAINT: Bone lesions  INTERVAL HISTORY: Patient is a 68 year old male who underwent imaging with complaint of significant left hip and back pain and was noted to have multiple bony lesions suspicious for malignancy.  He is anxious, but otherwise feels well.  He has no neurologic complaints.  He denies any recent fevers or illnesses.  He has a good appetite and denies weight loss.  He has no chest pain, shortness of breath, cough, or hemoptysis.  He denies any nausea, vomiting, constipation, or diarrhea.  No urinary complaints.  Patient offers no further specific complaints today.  REVIEW OF SYSTEMS:   Review of Systems  Constitutional:  Negative for fever, malaise/fatigue and weight loss.  Respiratory: Negative.  Negative for cough, hemoptysis and shortness of breath.   Cardiovascular: Negative.  Negative for chest pain and leg swelling.  Gastrointestinal: Negative.  Negative for abdominal pain.  Genitourinary: Negative.  Negative for dysuria.  Musculoskeletal:  Positive for back pain and joint pain.  Skin: Negative.  Negative for rash.  Neurological:  Negative for dizziness, focal weakness, weakness and headaches.  Psychiatric/Behavioral:  The patient is nervous/anxious.     As per HPI. Otherwise, a complete review of systems is negative.  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   s/p  08-25-2018  IR embolization cerebral coilling x3   CKD (chronic kidney disease), stage III (HCC)    GERD (gastroesophageal reflux disease)    Hyperlipidemia, mixed    Hypertension    Left flank pain    Left ureteral calculus 07/21/2024   OA (osteoarthritis)    Psychophysiologic insomnia 01/29/2024    Renal calculi 07/21/2024   bilateral   Type 2 diabetes mellitus (HCC)    followed by pcp   Wears dentures    full upper   Wears glasses     PAST SURGICAL HISTORY: Past Surgical History:  Procedure Laterality Date   CEREBRAL EMBOLIZATION  08/25/2018   @[facility];   x3  coiling superiorly directed Acomm aneurysm   CYSTOSCOPY/URETEROSCOPY/HOLMIUM LASER/STENT PLACEMENT Left 07/31/2024   Procedure: CYSTOSCOPY/URETEROSCOPY/HOLMIUM LASER/STENT PLACEMENT;  Surgeon: Shane Steffan BROCKS, MD;  Location: [facility];  Service: Urology;  Laterality: Left;   CYSTOSCOPY/URETEROSCOPY/HOLMIUM LASER/STENT PLACEMENT Left 08/26/2024   Procedure: CYSTOSCOPY/URETEROSCOPY/HOLMIUM LASER/STENT EXCHANGE;  Surgeon: Nieves Cough, MD;  Location: [facility] OR;  Service: Urology;  Laterality: Left;   ESOPHAGOGASTRODUODENOSCOPY N/A 07/30/2017   Procedure: ESOPHAGOGASTRODUODENOSCOPY (EGD) Possible esophageal dilation.;  Surgeon: Golda Claudis PENNER, MD;  Location: [facility];  Service: Endoscopy;  Laterality: N/A;   EXTRACORPOREAL SHOCK WAVE LITHOTRIPSY Left 07/29/2024   Procedure: LITHOTRIPSY, ESWL;  Surgeon: Nieves Cough, MD;  Location: [facility];  Service: Urology;  Laterality: Left;   ROTATOR CUFF REPAIR W/ DISTAL CLAVICLE EXCISION  08/03/2003   @[facility]  by dr duda   TOTAL KNEE ARTHROPLASTY Left 02/09/2024   Procedure: TOTAL KNEE ARTHROPLASTY;  Surgeon: Edie Norleen PARAS, MD;  Location: [facility] ORS;  Service: Orthopedics;  Laterality: Left;   TOTAL KNEE ARTHROPLASTY Right 2016   @[facility]     FAMILY HISTORY: Family History  Problem Relation Age of Onset   CAD Mother        CABG in 78's   CVA Sister     ADVANCED DIRECTIVES (Y/N):  N  HEALTH MAINTENANCE:  Social History   Tobacco Use   Smoking status: Former    Types: Cigarettes   Smokeless tobacco: Never   Tobacco comments:    07-28-2024  pt quit smoking 2005  Vaping Use   Vaping status: Never Used  Substance Use Topics   Alcohol use: No   Drug use: Never      Colonoscopy:  PAP:  Bone density:  Lipid panel:  Allergies  Allergen Reactions   Amlodipine  Hives   Lisinopril Hives   Losartan Potassium Hives   Betadine [Povidone Iodine] Rash    Current Outpatient Medications  Medication Sig Dispense Refill   diltiazem  (CARDIZEM  CD) 360 MG 24 hr capsule Take 1 capsule (360 mg total) by mouth daily. 90 capsule 3   empagliflozin  (JARDIANCE ) 10 MG TABS tablet Take 1 tablet (10 mg total) by mouth daily. 100 tablet 3   pantoprazole  (PROTONIX ) 40 MG tablet Take 1 tablet (40 mg total) by mouth 2 (two) times daily before a meal. 60 tablet 2   pravastatin  (PRAVACHOL ) 20 MG tablet Take 1 tablet (20 mg total) by mouth daily. 90 tablet 3   spironolactone  (ALDACTONE ) 25 MG tablet Take 0.5 tablets (12.5 mg total) by mouth daily. 45 tablet 3   triazolam  (HALCION ) 0.25 MG tablet Take 1 tablet (0.25 mg total) by mouth at bedtime as needed for sleep. 30 tablet 2   Vitamin D , Ergocalciferol , (DRISDOL ) 1.25 MG (50000 UNIT) CAPS capsule Take 1 capsule (50,000 Units total) by mouth every 7 (seven) days. 12 capsule 1   Oxycodone  HCl 10 MG TABS Take 1 tablet (10 mg total) by mouth every 4 (four) hours as needed. 60 tablet 0   tamsulosin  (FLOMAX ) 0.4 MG CAPS capsule Take 1 capsule (0.4 mg total) by mouth daily. (Patient not taking: Reported on 10/07/2024) 5 capsule 0   No current facility-administered medications for this visit.    OBJECTIVE: Vitals:   10/07/24 1137  BP: (!) 136/99  Pulse: 79  Resp: 16  Temp: 97.6 F (36.4 C)  SpO2: 97%     Body mass index is 30.99 kg/m.    ECOG FS:1 - Symptomatic but completely ambulatory  General: Well-developed, well-nourished, no acute distress. Eyes: Pink conjunctiva, anicteric sclera. HEENT: Normocephalic, moist mucous membranes. Lungs: No audible wheezing or coughing. Heart: Regular rate and rhythm. Abdomen: Soft, nontender, no obvious distention. Musculoskeletal: No edema, cyanosis, or clubbing. Neuro: Alert,  answering all questions appropriately. Cranial nerves grossly intact. Skin: No rashes or petechiae noted. Psych: Normal affect. Lymphatics: No cervical, calvicular, axillary or inguinal LAD.   LAB RESULTS:  Lab Results  Component Value Date   NA 138 10/07/2024   K 4.0 10/07/2024   CL 105 10/07/2024   CO2 20 (L) 10/07/2024   GLUCOSE 109 (H) 10/07/2024   BUN 28 (H) 10/07/2024   CREATININE 1.35 (H) 10/07/2024   CALCIUM 9.3 10/07/2024   PROT 8.1 10/07/2024   ALBUMIN 4.2 10/07/2024   AST 23 10/07/2024   ALT 30 10/07/2024   ALKPHOS 77 10/07/2024   BILITOT 0.7 10/07/2024   GFRNONAA 58 (L) 10/07/2024   GFRAA >60 07/28/2017    Lab Results  Component Value Date   WBC 11.3 (H) 10/07/2024   NEUTROABS 8.4 (H) 10/07/2024   HGB 15.1 10/07/2024   HCT 46.9 10/07/2024   MCV 91.4 10/07/2024   PLT 348 10/07/2024     STUDIES: MR HIP LEFT WO CONTRAST Result Date: 09/28/2024 CLINICAL DATA:  Left hip pain.  No known injury. EXAM: MR OF  THE LEFT HIP WITHOUT CONTRAST TECHNIQUE: Multiplanar, multisequence MR imaging was performed. No intravenous contrast was administered. COMPARISON:  CT scan 07/31/2024 FINDINGS: Bones: Somewhat rounded area Abner T1 and T2 signal intensity in the femoral head. Very dark T1 signal intensity and moderate T2 signal intensity. This is not have the typical appearance of a vascular necrosis but certainly could be an area of osteonecrosis. It is also possible there is an underlying bone lesion particularly given the other findings. There are degenerative changes in both I do not see any significant abnormality on the previous CT scan. There are moderate right and advanced left hip joint degenerative changes along with a left hip joint effusion. There is also a lesion involving the basicervical region of the left femoral neck and upper intertrochanteric region. This measures approximately 18 mm. No abnormality seen on the recent prior CT scan. There is also a small lesion in  the right iliac bone near the iliac crest. This measures 16 mm. Small lesion also noted in the lower right sacrum measuring 12 mm. Findings consistent with metastatic bone disease. Recommend correlation with any known primary cancer. Otherwise, metastatic workup is suggested with oncology referral. The hip and pelvic musculature are grossly normal. No muscle tear, myositis or muscle mass. No significant intrapelvic abnormalities. Could not totally exclude the possibility of a small lesion in the left peripheral zone of the prostate gland but recommend correlation with PSA level. No pelvic or inguinal adenopathy. IMPRESSION: 1. Findings consistent with metastatic bone disease. Recommend correlation with any known primary cancer. Otherwise, metastatic workup is suggested with oncology referral. 2. Left femoral head lesion and or osteonecrosis. 3. Moderate right and advanced left hip joint degenerative changes along with a left hip joint effusion. 4. No significant intrapelvic abnormalities. 5. Could not totally exclude the possibility of a small lesion in the left peripheral zone of the prostate gland but recommend correlation with PSA level. Electronically Signed   By: MYRTIS Stammer M.D.   On: 09/28/2024 22:31    ASSESSMENT: Bone lesions  PLAN:    Bone lesions: MRI results of left hip from September 28, 2024 reviewed independently and reported as above with at least 3 small lesions suspicious for underlying malignancy.  PSA is less than 1.  Full myeloma workup is pending at time of dictation.  Will get nuclear med bone scan as well as CT of chest, abdomen, and pelvis for further evaluation.  Return to clinic in 1 week after his imaging to discuss the results and additional diagnostic testing if necessary. Pain: Patient was given a prescription for oxycodone  today. Renal insufficiency: Patient's most recent creatinine is 1.35. Leukocytosis: Likely reactive, monitor.  I spent a total of 60 minutes reviewing  chart data, face-to-face evaluation with the patient, counseling and coordination of care as detailed above.   Patient expressed understanding and was in agreement with this plan. He also understands that He can call clinic at any time with any questions, concerns, or complaints.    Cancer Staging  No matching staging information was found for the patient.   Evalene JINNY Reusing, MD   10/08/2024 6:43 AM

## 2024-10-09 LAB — IGG, IGA, IGM
IgA: 506 mg/dL — ABNORMAL HIGH (ref 61–437)
IgG (Immunoglobin G), Serum: 1037 mg/dL (ref 603–1613)
IgM (Immunoglobulin M), Srm: 78 mg/dL (ref 20–172)

## 2024-10-09 NOTE — Assessment & Plan Note (Signed)
 Long-term triazolam  use for insomnia. Stress from potential cancer diagnosis worsens insomnia. Open to alternatives but not ready to taper off triazolam . - Continue triazolam  with 30 tablets per month. - Discuss potential alternatives for sleep aids in the future.

## 2024-10-09 NOTE — Assessment & Plan Note (Signed)
 MRI showed three bone lesions suggestive of metastatic bone cancer. Scheduled for oncological evaluation. PET scan likely needed. He is anxious and in significant pain. - Order PSA test to evaluate for prostate cancer. - Coordinate with oncologist for further evaluation and management. - Provide MRI report to him.

## 2024-10-10 ENCOUNTER — Telehealth: Payer: Self-pay

## 2024-10-10 ENCOUNTER — Other Ambulatory Visit: Payer: Self-pay

## 2024-10-10 ENCOUNTER — Telehealth: Payer: Self-pay | Admitting: Oncology

## 2024-10-10 ENCOUNTER — Inpatient Hospital Stay: Attending: Oncology | Admitting: Nurse Practitioner

## 2024-10-10 VITALS — BP 132/85 | HR 78 | Temp 97.8°F | Resp 18 | Ht 70.0 in | Wt 215.0 lb

## 2024-10-10 DIAGNOSIS — N289 Disorder of kidney and ureter, unspecified: Secondary | ICD-10-CM | POA: Insufficient documentation

## 2024-10-10 DIAGNOSIS — R937 Abnormal findings on diagnostic imaging of other parts of musculoskeletal system: Secondary | ICD-10-CM | POA: Diagnosis not present

## 2024-10-10 DIAGNOSIS — C7951 Secondary malignant neoplasm of bone: Secondary | ICD-10-CM

## 2024-10-10 DIAGNOSIS — L299 Pruritus, unspecified: Secondary | ICD-10-CM | POA: Diagnosis not present

## 2024-10-10 DIAGNOSIS — D72829 Elevated white blood cell count, unspecified: Secondary | ICD-10-CM | POA: Insufficient documentation

## 2024-10-10 DIAGNOSIS — M899 Disorder of bone, unspecified: Secondary | ICD-10-CM | POA: Insufficient documentation

## 2024-10-10 LAB — KAPPA/LAMBDA LIGHT CHAINS
Kappa free light chain: 43.9 mg/L — ABNORMAL HIGH (ref 3.3–19.4)
Kappa, lambda light chain ratio: 1.77 — ABNORMAL HIGH (ref 0.26–1.65)
Lambda free light chains: 24.8 mg/L (ref 5.7–26.3)

## 2024-10-10 MED ORDER — HYDROCODONE-ACETAMINOPHEN 5-325 MG PO TABS: 1.0000 | ORAL_TABLET | Freq: Four times a day (QID) | ORAL | Status: DC | PRN

## 2024-10-10 MED ORDER — HYDROCODONE-ACETAMINOPHEN 5-325 MG PO TABS: 1.0000 | ORAL_TABLET | ORAL | 0 refills | Status: DC | PRN

## 2024-10-10 NOTE — Progress Notes (Signed)
 [facility]  [phone] [phone]  ID: [facility] OB: 31-Mar-1956  MR#: [id number]  RDW#:[id number]  Patient Care Team: Ralph Doffing, FNP as PCP - General (Family Medicine) Ralph Evalene PARAS, MD as Consulting Physician (Oncology)  CHIEF COMPLAINT: Bone lesions  INTERVAL HISTORY: Patient is a 67 year old male who underwent imaging with complaint of significant left hip and back pain and was noted to have multiple bony lesions suspicious for malignancy.  Due to multiple bony lesions patient is having significant lower back pain.  He was originally taken oxycodone  with Tylenol  5 mg 325 but this dose was wearing off within about 5 hours.  On his last clinic visit last Thursday he was switched to oxycodone  10 mg every 4 hours.  He reports that he took a dose Thursday night, and again twice on Friday.  He reports that by Friday evening he began to itch all over and felt as if his skin was crawling.  He reports a rash that has since then resolved.  He denies any itching today he reports that he has went back to taking his 04/09/2024 oxycodone  with Tylenol  and is tolerating it well.  However he reports that it is only controlling his pain for about 4 hours at a time and he will need a refill soon.  We discussed the option to continue with his oxycodone /Tylenol  5/325 or rotate to an equivalent narcotic to see if he tolerated this better.  He did wish to rotate to something different so I have ordered for him to start hydrocodone /Tylenol  5/325 every 4 hours as needed for pain.  If itching persist I instructed the patient to take over-the-counter Claritin to and Pepcid.  I instructed him to stop taking this medication if it causes a rash or any other adverse symptoms and to call clinic he verbalizes understanding.  For any symptoms of anaphylaxis such as drooling, shortness of breath, wheezing, swelling of tongue or throat he will need to call 911 he verbalizes  understanding.  REVIEW OF SYSTEMS:   Review of Systems  Constitutional:  Negative for fever, malaise/fatigue and weight loss.  Respiratory: Negative.  Negative for Marquez, hemoptysis and shortness of breath.   Cardiovascular: Negative.  Negative for chest pain and leg swelling.  Gastrointestinal: Negative.  Negative for abdominal pain.  Genitourinary: Negative.  Negative for dysuria.  Musculoskeletal:  Positive for back pain and joint pain.  Skin: Negative.  Negative for rash.  Neurological:  Negative for dizziness, focal weakness, weakness and headaches.  Psychiatric/Behavioral:  The patient is not nervous/anxious.     As per HPI. Otherwise, a complete review of systems is negative.  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   s/p  08-25-2018  IR embolization cerebral coilling x3   CKD (chronic kidney disease), stage III (HCC)    GERD (gastroesophageal reflux disease)    Hyperlipidemia, mixed    Hypertension    Left flank pain    Left ureteral calculus 07/21/2024   OA (osteoarthritis)    Psychophysiologic insomnia 01/29/2024   Renal calculi 07/21/2024   bilateral   Type 2 diabetes mellitus (HCC)    followed by pcp   Wears dentures    full upper   Wears glasses     PAST SURGICAL HISTORY: Past Surgical History:  Procedure Laterality Date   CEREBRAL EMBOLIZATION  08/25/2018   @[id number];   x3  coiling superiorly directed Acomm aneurysm   CYSTOSCOPY/URETEROSCOPY/HOLMIUM LASER/STENT  PLACEMENT Left 07/31/2024   Procedure: CYSTOSCOPY/URETEROSCOPY/HOLMIUM LASER/STENT PLACEMENT;  Surgeon: Ralph Steffan BROCKS, MD;  Location: [facility];  Service: Urology;  Laterality: Left;   CYSTOSCOPY/URETEROSCOPY/HOLMIUM LASER/STENT PLACEMENT Left 08/26/2024   Procedure: CYSTOSCOPY/URETEROSCOPY/HOLMIUM LASER/STENT EXCHANGE;  Surgeon: Ralph Cough, MD;  [facility];  Service: Urology;  Laterality: Left;    ESOPHAGOGASTRODUODENOSCOPY N/A 07/30/2017   Procedure: ESOPHAGOGASTRODUODENOSCOPY (EGD) Possible esophageal dilation.;  Surgeon: Ralph Claudis PENNER, MD;  Location: [facility];  Service: Endoscopy;  Laterality: N/A;   EXTRACORPOREAL SHOCK WAVE LITHOTRIPSY Left 07/29/2024   Procedure: LITHOTRIPSY, ESWL;  Surgeon: Ralph Cough, MD;  Location: [facility];  Service: Urology;  Laterality: Left;   ROTATOR CUFF REPAIR W/ DISTAL CLAVICLE EXCISION  08/03/2003   @[facility]  by dr duda   TOTAL KNEE ARTHROPLASTY Left 02/09/2024   Procedure: TOTAL KNEE ARTHROPLASTY;  Surgeon: Ralph Norleen PARAS, MD;  Location: [facility];  Service: Orthopedics;  Laterality: Left;   TOTAL KNEE ARTHROPLASTY Right 2016   @[facility]     FAMILY HISTORY: Family History  Problem Relation Age of Onset   CAD Mother        CABG in 50's   CVA Sister     ADVANCED DIRECTIVES (Y/N):  N  HEALTH MAINTENANCE: Social History   Tobacco Use   Smoking status: Former    Types: Cigarettes   Smokeless tobacco: Never   Tobacco comments:    07-28-2024  pt quit smoking 2005  Vaping Use   Vaping status: Never Used  Substance Use Topics   Alcohol use: No   Drug use: Never     Colonoscopy:  PAP:  Bone density:  Lipid panel:  Allergies  Allergen Reactions   Amlodipine  Hives   Lisinopril Hives   Losartan Potassium Hives   Betadine [Povidone Iodine] Rash    Current Outpatient Medications  Medication Sig Dispense Refill   diltiazem  (CARDIZEM  CD) 360 MG 24 hr capsule Take 1 capsule (360 mg total) by mouth daily. 90 capsule 3   empagliflozin  (JARDIANCE ) 10 MG TABS tablet Take 1 tablet (10 mg total) by mouth daily. 100 tablet 3   HYDROcodone -acetaminophen  (NORCO/VICODIN) 5-325 MG tablet Take 1 tablet by mouth every 4 (four) hours as needed for up to 120 doses for moderate pain (pain score 4-6). 30 tablet 0   pantoprazole  (PROTONIX ) 40 MG tablet Take 1 tablet (40 mg total) by mouth 2 (two) times daily before a meal. 60 tablet 2   pravastatin   (PRAVACHOL ) 20 MG tablet Take 1 tablet (20 mg total) by mouth daily. 90 tablet 3   spironolactone  (ALDACTONE ) 25 MG tablet Take 0.5 tablets (12.5 mg total) by mouth daily. 45 tablet 3   triazolam  (HALCION ) 0.25 MG tablet Take 1 tablet (0.25 mg total) by mouth at bedtime as needed for sleep. 30 tablet 2   Vitamin D , Ergocalciferol , (DRISDOL ) 1.25 MG (50000 UNIT) CAPS capsule Take 1 capsule (50,000 Units total) by mouth every 7 (seven) days. 12 capsule 1   Current Facility-Administered Medications  Medication Dose Route Frequency Provider Last Rate Last Admin   HYDROcodone -acetaminophen  (NORCO/VICODIN) 5-325 MG per tablet 1 tablet  1 tablet Oral Q6H PRN Lanell Jacobsen, NP        OBJECTIVE: Vitals:   10/10/24 1330  BP: 132/85  Pulse: 78  Resp: 18  Temp: 97.8 F (36.6 C)     Body mass index is 30.85 kg/m.    ECOG FS:1 - Symptomatic but completely ambulatory  General: Well-developed, well-nourished, no acute distress. Eyes: Pink conjunctiva, anicteric sclera. HEENT:  Normocephalic, moist mucous membranes. Lungs: No audible wheezing or coughing. Heart: Regular rate and rhythm. Abdomen: Soft, nontender, no obvious distention. Musculoskeletal: No edema, cyanosis, or clubbing. Neuro: Alert, answering all questions appropriately. Cranial nerves grossly intact. Skin: No rashes or petechiae noted. Psych: Normal affect. Lymphatics: No cervical, calvicular, axillary or inguinal LAD.   LAB RESULTS:  Lab Results  Component Value Date   NA 138 10/07/2024   K 4.0 10/07/2024   CL 105 10/07/2024   CO2 20 (L) 10/07/2024   GLUCOSE 109 (H) 10/07/2024   BUN 28 (H) 10/07/2024   CREATININE 1.35 (H) 10/07/2024   CALCIUM 9.3 10/07/2024   PROT 8.1 10/07/2024   ALBUMIN 4.2 10/07/2024   AST 23 10/07/2024   ALT 30 10/07/2024   ALKPHOS 77 10/07/2024   BILITOT 0.7 10/07/2024   GFRNONAA 58 (L) 10/07/2024   GFRAA >60 07/28/2017    Lab Results  Component Value Date   WBC 11.3 (H) 10/07/2024    NEUTROABS 8.4 (H) 10/07/2024   HGB 15.1 10/07/2024   HCT 46.9 10/07/2024   MCV 91.4 10/07/2024   PLT 348 10/07/2024     STUDIES: MR HIP LEFT WO CONTRAST Result Date: 09/28/2024 CLINICAL DATA:  Left hip pain.  No known injury. EXAM: MR OF THE LEFT HIP WITHOUT CONTRAST TECHNIQUE: Multiplanar, multisequence MR imaging was performed. No intravenous contrast was administered. COMPARISON:  CT scan 07/31/2024 FINDINGS: Bones: Somewhat rounded area Abner T1 and T2 signal intensity in the femoral head. Very dark T1 signal intensity and moderate T2 signal intensity. This is not have the typical appearance of a vascular necrosis but certainly could be an area of osteonecrosis. It is also possible there is an underlying bone lesion particularly given the other findings. There are degenerative changes in both I do not see any significant abnormality on the previous CT scan. There are moderate right and advanced left hip joint degenerative changes along with a left hip joint effusion. There is also a lesion involving the basicervical region of the left femoral neck and upper intertrochanteric region. This measures approximately 18 mm. No abnormality seen on the recent prior CT scan. There is also a small lesion in the right iliac bone near the iliac crest. This measures 16 mm. Small lesion also noted in the lower right sacrum measuring 12 mm. Findings consistent with metastatic bone disease. Recommend correlation with any known primary cancer. Otherwise, metastatic workup is suggested with oncology referral. The hip and pelvic musculature are grossly normal. No muscle tear, myositis or muscle mass. No significant intrapelvic abnormalities. Could not totally exclude the possibility of a small lesion in the left peripheral zone of the prostate gland but recommend correlation with PSA level. No pelvic or inguinal adenopathy. IMPRESSION: 1. Findings consistent with metastatic bone disease. Recommend correlation with any  known primary cancer. Otherwise, metastatic workup is suggested with oncology referral. 2. Left femoral head lesion and or osteonecrosis. 3. Moderate right and advanced left hip joint degenerative changes along with a left hip joint effusion. 4. No significant intrapelvic abnormalities. 5. Could not totally exclude the possibility of a small lesion in the left peripheral zone of the prostate gland but recommend correlation with PSA level. Electronically Signed   By: MYRTIS Stammer M.D.   On: 09/28/2024 22:31    ASSESSMENT: Itching with oxycodone  10 mg  PLAN:    Itching/rash post oxycodone :  Stop oxycodone  10 mg.  Start hydrocodone /Tylenol  5 mg / 325 mg every 4 hours.  Call clinic with any  signs and symptoms of itching or rash.  Call 911 for any symptoms of anaphylaxis such as wheezing, shortness of breath, drooling, swelling of tongue throat lips.  Follow up as planned.  For ongoing itching can take OTC claritin and pepcid.      Patient expressed understanding and was in agreement with this plan. He also understands that He can call clinic at any time with any questions, concerns, or complaints.     Morna Husband, NP   10/10/2024 2:27 PM

## 2024-10-10 NOTE — Telephone Encounter (Addendum)
 [name] called to report that [facility] started itching from head to toe after taking Oxycodone  10 mg that was prescribed by Dr. Jacobo on 10/07/24.  Has taken Oxycodone  5/325 with no problems earlier this year but concerned that dose may no help with pain.  [facility] has not taken Oxycodone  10 mg today and pain is 8/10 located in low back, right hip, and right leg.  Also would like an update on getting images scheduled that were discussed at visit.

## 2024-10-10 NOTE — Telephone Encounter (Signed)
 Called pt to schedule CT and let him know of bone scan appt info. Pt said his pain meds aren't working and that he was itching bad from the pain meds that Finn prescribed. Messaged clinical and made them aware of the rash and that he would like to get different meds that would help with his pain. - LH

## 2024-10-10 NOTE — Progress Notes (Signed)
 Pt reports that he took oxycodone  IR 10 mg tablets every 4 hours on Thursday and by Friday he was itching. Itching resolved after he quit taking the oxycodone  10. He started oxycodone  apap 5/325 which his orthopedic provider gave him months ago. He stated that the oxycodone  apap did not cause the itching. He refuses to take the oxycodone  IR 10 mg any longer.

## 2024-10-12 LAB — PROTEIN ELECTROPHORESIS, SERUM
A/G Ratio: 1 (ref 0.7–1.7)
Albumin ELP: 3.8 g/dL (ref 2.9–4.4)
Alpha-1-Globulin: 0.3 g/dL (ref 0.0–0.4)
Alpha-2-Globulin: 1.3 g/dL — ABNORMAL HIGH (ref 0.4–1.0)
Beta Globulin: 1.5 g/dL — ABNORMAL HIGH (ref 0.7–1.3)
Gamma Globulin: 1 g/dL (ref 0.4–1.8)
Globulin, Total: 4 g/dL — ABNORMAL HIGH (ref 2.2–3.9)
Total Protein ELP: 7.8 g/dL (ref 6.0–8.5)

## 2024-10-12 LAB — BETA 2 MICROGLOBULIN, SERUM: Beta-2 Microglobulin: 3.3 mg/L — ABNORMAL HIGH (ref 0.6–2.4)

## 2024-10-13 ENCOUNTER — Ambulatory Visit
Admission: RE | Admit: 2024-10-13 | Discharge: 2024-10-13 | Disposition: A | Discharge status: Home / Self Care | Source: Ambulatory Visit | Attending: Oncology | Admitting: Oncology

## 2024-10-13 ENCOUNTER — Ambulatory Visit: Admission: RE | Admit: 2024-10-13 | Discharge: 2024-10-13 | Attending: Oncology | Admitting: Oncology

## 2024-10-13 ENCOUNTER — Ambulatory Visit
Admission: RE | Admit: 2024-10-13 | Discharge: 2024-10-13 | Disposition: A | Source: Ambulatory Visit | Attending: Oncology | Admitting: Oncology

## 2024-10-13 DIAGNOSIS — C7951 Secondary malignant neoplasm of bone: Secondary | ICD-10-CM

## 2024-10-13 MED ORDER — IOHEXOL 300 MG/ML  SOLN
100.0000 mL | Freq: Once | INTRAMUSCULAR | Status: AC | PRN
  Administered 2024-10-13: 80 mL via INTRAVENOUS

## 2024-10-13 MED ORDER — TECHNETIUM TC 99M MEDRONATE IV KIT
20.0000 | PACK | Freq: Once | INTRAVENOUS | Status: AC | PRN
  Administered 2024-10-13: 23.89 via INTRAVENOUS

## 2024-10-14 ENCOUNTER — Inpatient Hospital Stay: Admitting: Oncology

## 2024-10-17 ENCOUNTER — Telehealth: Payer: Self-pay | Admitting: Oncology

## 2024-10-17 ENCOUNTER — Other Ambulatory Visit: Payer: Self-pay | Admitting: *Deleted

## 2024-10-17 ENCOUNTER — Inpatient Hospital Stay: Admitting: Oncology

## 2024-10-17 ENCOUNTER — Telehealth: Payer: Self-pay

## 2024-10-17 ENCOUNTER — Encounter: Payer: Self-pay | Admitting: Oncology

## 2024-10-17 VITALS — BP 150/88 | HR 87 | Temp 97.8°F | Resp 18 | Ht 70.0 in | Wt 215.0 lb

## 2024-10-17 DIAGNOSIS — M899 Disorder of bone, unspecified: Secondary | ICD-10-CM | POA: Diagnosis not present

## 2024-10-17 MED ORDER — HYDROCODONE-ACETAMINOPHEN 5-325 MG PO TABS: 1.0000 | ORAL_TABLET | ORAL | 0 refills | Status: DC | PRN

## 2024-10-17 NOTE — Telephone Encounter (Signed)
 Pt called to ask for med refill prior to appt - forwarded call to triage and sent secure chat as well - LH

## 2024-10-17 NOTE — Progress Notes (Signed)
 [facility]  [phone] [phone]  ID: Ralph Marquez OB: 27-Apr-1956  MR#: [id number]  RDW#:[id number]  Patient Care Team: Bevely Doffing, FNP as PCP - General (Family Medicine) Jacobo Evalene PARAS, MD as Consulting Physician (Oncology)  CHIEF COMPLAINT: Bone lesions  INTERVAL HISTORY: Patient returns to clinic today for further evaluation and discussion of his imaging and laboratory results.  He continues to have significant left hip pain, but otherwise feels well. He has no neurologic complaints.  He denies any recent fevers or illnesses.  He has a good appetite and denies weight loss.  He has no chest pain, shortness of breath, cough, or hemoptysis.  He denies any nausea, vomiting, constipation, or diarrhea.  No urinary complaints.  Patient offers no further specific complaints today.  REVIEW OF SYSTEMS:   Review of Systems  Constitutional:  Negative for fever, malaise/fatigue and weight loss.  Respiratory: Negative.  Negative for cough, hemoptysis and shortness of breath.   Cardiovascular: Negative.  Negative for chest pain and leg swelling.  Gastrointestinal: Negative.  Negative for abdominal pain.  Genitourinary: Negative.  Negative for dysuria.  Musculoskeletal:  Positive for back pain and joint pain.  Skin: Negative.  Negative for rash.  Neurological:  Negative for dizziness, focal weakness, weakness and headaches.  Psychiatric/Behavioral: Negative.  The patient is not nervous/anxious.     As per HPI. Otherwise, a complete review of systems is negative.  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   s/p  08-25-2018  IR embolization cerebral coilling x3   CKD (chronic kidney disease), stage III (HCC)    GERD (gastroesophageal reflux disease)    Hyperlipidemia, mixed    Hypertension    Left flank pain    Left ureteral calculus 07/21/2024   OA (osteoarthritis)     Psychophysiologic insomnia 01/29/2024   Renal calculi 07/21/2024   bilateral   Type 2 diabetes mellitus (HCC)    followed by pcp   Wears dentures    full upper   Wears glasses     PAST SURGICAL HISTORY: Past Surgical History:  Procedure Laterality Date   CEREBRAL EMBOLIZATION  08/25/2018   @[facility];   x3  coiling superiorly directed Acomm aneurysm   CYSTOSCOPY/URETEROSCOPY/HOLMIUM LASER/STENT PLACEMENT Left 07/31/2024   Procedure: CYSTOSCOPY/URETEROSCOPY/HOLMIUM LASER/STENT PLACEMENT;  Surgeon: Shane Steffan BROCKS, MD;  Location: [facility];  Service: Urology;  Laterality: Left;   CYSTOSCOPY/URETEROSCOPY/HOLMIUM LASER/STENT PLACEMENT Left 08/26/2024   Procedure: CYSTOSCOPY/URETEROSCOPY/HOLMIUM LASER/STENT EXCHANGE;  Surgeon: Nieves Cough, MD;  Location: [facility] OR;  Service: Urology;  Laterality: Left;   ESOPHAGOGASTRODUODENOSCOPY N/A 07/30/2017   Procedure: ESOPHAGOGASTRODUODENOSCOPY (EGD) Possible esophageal dilation.;  Surgeon: Golda Claudis PENNER, MD;  Location: [facility];  Service: Endoscopy;  Laterality: N/A;   EXTRACORPOREAL SHOCK WAVE LITHOTRIPSY Left 07/29/2024   Procedure: LITHOTRIPSY, ESWL;  Surgeon: Nieves Cough, MD;  Location: [facility];  Service: Urology;  Laterality: Left;   ROTATOR CUFF REPAIR W/ DISTAL CLAVICLE EXCISION  08/03/2003   @[facility]  by dr duda   TOTAL KNEE ARTHROPLASTY Left 02/09/2024   Procedure: TOTAL KNEE ARTHROPLASTY;  Surgeon: Edie Norleen PARAS, MD;  Location: [facility] ORS;  Service: Orthopedics;  Laterality: Left;   TOTAL KNEE ARTHROPLASTY Right 2016   @[facility]     FAMILY HISTORY: Family History  Problem Relation Age of Onset   CAD Mother        CABG in 21's   CVA Sister     ADVANCED DIRECTIVES (Y/N):  N  HEALTH MAINTENANCE: Social History   Tobacco Use   Smoking status: Former    Types: Cigarettes   Smokeless tobacco: Never   Tobacco comments:    07-28-2024  pt quit smoking 2005  Vaping Use   Vaping status: Never Used  Substance Use Topics    Alcohol use: No   Drug use: Never     Colonoscopy:  PAP:  Bone density:  Lipid panel:  Allergies  Allergen Reactions   Amlodipine  Hives   Lisinopril Hives   Losartan Potassium Hives   Oxycodone  Itching   Betadine [Povidone Iodine] Rash    Current Outpatient Medications  Medication Sig Dispense Refill   diltiazem  (CARDIZEM  CD) 360 MG 24 hr capsule Take 1 capsule (360 mg total) by mouth daily. 90 capsule 3   empagliflozin  (JARDIANCE ) 10 MG TABS tablet Take 1 tablet (10 mg total) by mouth daily. 100 tablet 3   HYDROcodone -acetaminophen  (NORCO/VICODIN) 5-325 MG tablet Take 1 tablet by mouth every 4 (four) hours as needed for up to 120 doses for moderate pain (pain score 4-6). 30 tablet 0   pantoprazole  (PROTONIX ) 40 MG tablet Take 1 tablet (40 mg total) by mouth 2 (two) times daily before a meal. 60 tablet 2   pravastatin  (PRAVACHOL ) 20 MG tablet Take 1 tablet (20 mg total) by mouth daily. 90 tablet 3   spironolactone  (ALDACTONE ) 25 MG tablet Take 0.5 tablets (12.5 mg total) by mouth daily. 45 tablet 3   triazolam  (HALCION ) 0.25 MG tablet Take 1 tablet (0.25 mg total) by mouth at bedtime as needed for sleep. 30 tablet 2   Vitamin D , Ergocalciferol , (DRISDOL ) 1.25 MG (50000 UNIT) CAPS capsule Take 1 capsule (50,000 Units total) by mouth every 7 (seven) days. 12 capsule 1   No current facility-administered medications for this visit.    OBJECTIVE: Vitals:   10/17/24 1315 10/17/24 1318  BP: (!) 143/90 (!) 150/88  Pulse: 87   Resp: 18   Temp: 97.8 F (36.6 C)   SpO2: 99%      Body mass index is 30.85 kg/m.    ECOG FS:1 - Symptomatic but completely ambulatory  General: Well-developed, well-nourished, no acute distress. Eyes: Pink conjunctiva, anicteric sclera. HEENT: Normocephalic, moist mucous membranes. Lungs: No audible wheezing or coughing. Heart: Regular rate and rhythm. Abdomen: Soft, nontender, no obvious distention. Musculoskeletal: No edema, cyanosis, or  clubbing. Neuro: Alert, answering all questions appropriately. Cranial nerves grossly intact. Skin: No rashes or petechiae noted. Psych: Normal affect.  LAB RESULTS:  Lab Results  Component Value Date   NA 138 10/07/2024   K 4.0 10/07/2024   CL 105 10/07/2024   CO2 20 (L) 10/07/2024   GLUCOSE 109 (H) 10/07/2024   BUN 28 (H) 10/07/2024   CREATININE 1.35 (H) 10/07/2024   CALCIUM 9.3 10/07/2024   PROT 8.1 10/07/2024   ALBUMIN 4.2 10/07/2024   AST 23 10/07/2024   ALT 30 10/07/2024   ALKPHOS 77 10/07/2024   BILITOT 0.7 10/07/2024   GFRNONAA 58 (L) 10/07/2024   GFRAA >60 07/28/2017    Lab Results  Component Value Date   WBC 11.3 (H) 10/07/2024   NEUTROABS 8.4 (H) 10/07/2024   HGB 15.1 10/07/2024   HCT 46.9 10/07/2024   MCV 91.4 10/07/2024   PLT 348 10/07/2024     STUDIES: CT CHEST ABDOMEN PELVIS W CONTRAST Result Date: 10/14/2024 EXAM: CT CHEST, ABDOMEN AND PELVIS WITH CONTRAST 10/13/2024 10:09:52 AM TECHNIQUE: CT of the chest, abdomen and pelvis was performed with the administration of  80 mL iohexol  (Omnipaque ) 300 mg/mL solution. Multiplanar reformatted images are provided for review. Automated exposure control, iterative reconstruction, and/or weight based adjustment of the mA/kV was utilized to reduce the radiation dose to as low as reasonably achievable. COMPARISON: Bone scan 10/13/2024, MRI 09/27/2024, CT from 2016. CLINICAL HISTORY: Cancer, metastatic to bone. * Tracking Code: BO * FINDINGS: CHEST: MEDIASTINUM AND LYMPH NODES: Heart and pericardium are unremarkable. The central airways are clear. No mediastinal, hilar or axillary lymphadenopathy. LUNGS AND PLEURA: No focal consolidation or pulmonary edema. No pleural effusion or pneumothorax. ABDOMEN AND PELVIS: LIVER: No enhancing hepatic lesion. GALLBLADDER AND BILE DUCTS: Gallbladder is unremarkable. No biliary ductal dilatation. SPLEEN: No acute abnormality. PANCREAS: Along the ventral aspect of the genu of the pancreas  there is a 17 mm simple fluid attenuation unilocular cyst which does not communicate with the pancreatic duct. Compared to CT from 2016, the pancreatic cystic lesion is increased in size. Recommend follow up MRI with and without contrast in 6 months. ADRENAL GLANDS: There is mild enlargement of the lateral limb of the left adrenal gland to 19 x 10 mm. The left adrenal lesion is stable compared to CT from 2016. KIDNEYS, URETERS AND BLADDER: Simple fluid attenuation cystic lesion of the right kidney measures 2.6 cm, consistent with a benign cyst. Per consensus, no follow-up is needed for simple Bosniak type 1 and 2 renal cysts, unless the patient has a malignancy history or risk factors. No stones in the kidneys or ureters. No hydronephrosis. No perinephric or periureteral stranding. Ureters and bladder are normal. GI AND BOWEL: Stomach demonstrates no acute abnormality. There is no bowel obstruction. REPRODUCTIVE ORGANS: No acute abnormality. PERITONEUM AND RETROPERITONEUM: No ascites. No free air. VASCULATURE: Aorta is normal in caliber. ABDOMINAL AND PELVIS LYMPH NODES: No abdominal or pelvic lymphadenopathy. BONES AND SOFT TISSUES: Review of the skeleton demonstrated subtle sclerosis in the right iliac wing measuring 12 mm on image 95 of series 2. This corresponds to the MRI finding. No MDP bone scan abnormality. No clear lesion in the left femoral head or neck. No lesion in the left femoral neck to correspond with MRI findings. There is degenerative change in the humeral head corresponding to MRI findings. IMPRESSION: 1. no evidence of primary malignant lesion on CT chest abdomen and pelvis Consider FDG PET/CT if continued concern for malignancy. 2. Subtle sclerotic lesion in the right iliac wing, indeterminate, corresponding to MRI findings. 3. No clear lesion in the left femoral neck to correspond with MRI findings. 4. 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 Recommend MRI pancreas with and  without contrast in 6 months. 5. Mild enlargement of the lateral limb of the left adrenal gland, stable since 2016. Electronically signed by: Norleen Boxer MD 10/14/2024 12:59 PM EST RP Workstation: [id number]   NM Bone Scan Whole Body Result Date: 10/14/2024 EXAM: NM WHOLE BODY BONE SCAN 10/13/2024 01:58:00 PM TECHNIQUE: Anterior and posterior whole body images were obtained at least 3 hours following the intravenous administration of radiopharmaceutical. RADIOPHARMACEUTICAL: 23.89 millicurie (technetium medronate (TC-MDP) injection 20 millicurie TECHNETIUM TC 99M  MEDRONATE IV KIT) COMPARISON: MRI 09/27/2024, CT 10/13/2024 CLINICAL HISTORY: MRI for left hip pain demonstrate lesion suspicious for metastasis. FINDINGS: BONES: No convincing evidence of metastasis. Mild asymmetric radiotracer accumulation within the left femoral head which corresponds to osteonecrosis process and or aggressive lesion on comparison MRI. No lesion in the right iliac bone to correspond to the MRI findings; the lesion in the right iliac wing is not appreciated on  bone scan. Intense uptake in the right midfoot and mild uptake in the left midfoot; this is favored degenerative or posttraumatic. There is a focus of uptake at the vertex of the skull which is indeterminate. Mild degenerative uptake noted in the right thoracic spine at the costovertebral junctions. SOFT TISSUES: Physiologic activity within the kidneys and urinary bladder. IMPRESSION: 1. No convincing evidence of metastasis. 2. Asymmetric uptake in the left femoral head corresponds to osteonecrosis versus aggressive lesion on comparison MRI. 3. Previously described right iliac wing lesion is not appreciated on this bone scan. 4. Intense right midfoot and mild left midfoot uptake are favored degenerative or posttraumatic. 5. Indeterminate focus of uptake at the skull vertex. Electronically signed by: Norleen Boxer MD 10/14/2024 12:42 PM EST RP Workstation: [id number]   MR HIP  LEFT WO CONTRAST Result Date: 09/28/2024 CLINICAL DATA:  Left hip pain.  No known injury. EXAM: MR OF THE LEFT HIP WITHOUT CONTRAST TECHNIQUE: Multiplanar, multisequence MR imaging was performed. No intravenous contrast was administered. COMPARISON:  CT scan 07/31/2024 FINDINGS: Bones: Somewhat rounded area Abner T1 and T2 signal intensity in the femoral head. Very dark T1 signal intensity and moderate T2 signal intensity. This is not have the typical appearance of a vascular necrosis but certainly could be an area of osteonecrosis. It is also possible there is an underlying bone lesion particularly given the other findings. There are degenerative changes in both I do not see any significant abnormality on the previous CT scan. There are moderate right and advanced left hip joint degenerative changes along with a left hip joint effusion. There is also a lesion involving the basicervical region of the left femoral neck and upper intertrochanteric region. This measures approximately 18 mm. No abnormality seen on the recent prior CT scan. There is also a small lesion in the right iliac bone near the iliac crest. This measures 16 mm. Small lesion also noted in the lower right sacrum measuring 12 mm. Findings consistent with metastatic bone disease. Recommend correlation with any known primary cancer. Otherwise, metastatic workup is suggested with oncology referral. The hip and pelvic musculature are grossly normal. No muscle tear, myositis or muscle mass. No significant intrapelvic abnormalities. Could not totally exclude the possibility of a small lesion in the left peripheral zone of the prostate gland but recommend correlation with PSA level. No pelvic or inguinal adenopathy. IMPRESSION: 1. Findings consistent with metastatic bone disease. Recommend correlation with any known primary cancer. Otherwise, metastatic workup is suggested with oncology referral. 2. Left femoral head lesion and or osteonecrosis. 3.  Moderate right and advanced left hip joint degenerative changes along with a left hip joint effusion. 4. No significant intrapelvic abnormalities. 5. Could not totally exclude the possibility of a small lesion in the left peripheral zone of the prostate gland but recommend correlation with PSA level. Electronically Signed   By: MYRTIS Stammer M.D.   On: 09/28/2024 22:31    ASSESSMENT: Bone lesions  PLAN:    Bone lesions: MRI results of left hip from September 28, 2024 reviewed independently suggestive of at least 3 small lesions suspicious for underlying malignancy.  Despite this CT scan of chest, abdomen, and pelvis, and nuclear med bone scan completed on October 13, 2024 did not reveal any suspicious lesions for malignancy.  PSA is less than 1.  SPEP is negative.  Patient had a mildly elevated IgA as well as kappa free light chain, but these are likely clinically insignificant.  No further intervention is needed.  Case discussed with orthopedics who will now likely proceed with left hip replacement surgery.  No follow-up needed in the cancer center.  Pain: Previously, patient developed itching with oxycodone , therefore was given a refill of hydrocodone  today.  He has been instructed that all further prescriptions should come from orthopedics. Renal insufficiency: Patient's most recent creatinine is 1.35. Leukocytosis: Likely reactive, monitor.  I spent a total of 30 minutes reviewing chart data, face-to-face evaluation with the patient, counseling and coordination of care as detailed above.  Patient expressed understanding and was in agreement with this plan. He also understands that He can call clinic at any time with any questions, concerns, or complaints.    Evalene JINNY Reusing, MD   10/17/2024 2:11 PM

## 2024-10-17 NOTE — Progress Notes (Signed)
 Refill on his pain medication, but the pain medication that he is currently on isn't really working.

## 2024-10-17 NOTE — Telephone Encounter (Signed)
 Patient has an appt with Dr. Jacobo at 2:15 today.  Wanted to to know if Dr. Jacobo would go ahead and send refill for Hydrocodone  prior to MD visit due to worried pharmacy will not have rx ready if sent in later.  Discussed if he feels the Hydrocodone  controls the pain.  He is needing to take pain med every 3-4 hours to keep pain level at 2/10.  But the pain level is at 6-8/10 within 3-4 hours.    Explained to pt, not sure if MD wants to go ahead and refill the Hydrocodone  now or wait to discuss pain treatment at this afternoon visit.

## 2024-10-19 ENCOUNTER — Telehealth: Payer: Self-pay

## 2024-10-19 ENCOUNTER — Ambulatory Visit: Payer: Self-pay

## 2024-10-19 ENCOUNTER — Other Ambulatory Visit: Payer: Self-pay

## 2024-10-19 DIAGNOSIS — F5104 Psychophysiologic insomnia: Secondary | ICD-10-CM

## 2024-10-19 MED ORDER — TRIAZOLAM 0.25 MG PO TABS: 0.2500 mg | ORAL_TABLET | Freq: Every evening | ORAL | 2 refills | Status: DC | PRN

## 2024-10-19 NOTE — Telephone Encounter (Signed)
 FYI Only or Action Required?: Action required by provider: medication refill request.  Patient was last seen in primary care on 10/04/2024 by Bevely Doffing, FNP.  Called Nurse Triage reporting Sinusitis.  Symptoms began several days ago.  Interventions attempted: OTC medications: alka seltzer and Other: netipot.  Symptoms are: gradually worsening.  Triage Disposition: Home Care  Patient/caregiver understands and will follow disposition?: No, wishes to speak with PCP

## 2024-10-19 NOTE — Telephone Encounter (Signed)
 Copied from CRM [id number]. Topic: Clinical - Medication Question >> Oct 19, 2024  3:08 PM Cherylann RAMAN wrote: Reason for CRM: Patient requesting an update on medication. States that he spoke to provider [name] DEL. Patient states that the medication triazolam  (HALCION ) 0.25 MG tablet will be out tomorrow and provider is aware that it is important for him to have this medication as he is unable to sleep without it. Please contact patient at [phone].

## 2024-10-19 NOTE — Telephone Encounter (Signed)
 Copied from CRM [id number]. Topic: Clinical - Medication Refill >> Oct 19, 2024  9:04 AM Wess RAMAN wrote: Medication: triazolam  (HALCION ) 0.25 MG tablet   Has the patient contacted their pharmacy? Yes (Agent: If no, request that the patient contact the pharmacy for the refill. If patient does not wish to contact the pharmacy document the reason why and proceed with request.) (Agent: If yes, when and what did the pharmacy advise?)  This is the patient's preferred pharmacy:  [facility] - [place], [place] - [address] [place] [address] Phone: [phone] Fax: [phone]  Is this the correct pharmacy for this prescription? Yes If no, delete pharmacy and type the correct one.   Has the prescription been filled recently? Yes  Is the patient out of the medication? Yes  Has the patient been seen for an appointment in the last year OR does the patient have an upcoming appointment? Yes  Can we respond through MyChart? No  Agent: Please be advised that Rx refills may take up to 3 business days. We ask that you follow-up with your pharmacy.

## 2024-10-19 NOTE — Telephone Encounter (Signed)
 Reason for Disposition  [1] Sinus congestion as part of a cold AND [2] present < 10 days  Answer Assessment - Initial Assessment Questions Neti pot and alka- seltzer. Pt states that this happens every year. Pt states that he just needs an atbx to break it up.  He also states he needs his sleep medicine refilled. He will be out of it after tonight.   He also wanted her to know that he does not have cancer, it is just his hip needed replaced.   1. LOCATION: Where does it hurt?      sinus 2. ONSET: When did the sinus pain start?  (e.g., hours, days)      Started on Monday.  3. SEVERITY: How bad is the pain?   (Scale 0-10; or none, mild, moderate or severe)     Mostly the mucus is what he's worried about.  4. RECURRENT SYMPTOM: Have you ever had sinus problems before? If Yes, ask: When was the last time? and What happened that time?      yes 5. NASAL CONGESTION: Is the nose blocked? If Yes, ask: Can you open it or must you breathe through your mouth?     yes 6. NASAL DISCHARGE: Do you have discharge from your nose? If so ask, What color?     White mucus 7. FEVER: Do you have a fever? If Yes, ask: What is it, how was it measured, and when did it start?      no 8. OTHER SYMPTOMS: Do you have any other symptoms? (e.g., sore throat, cough, earache, difficulty breathing)     Throat itchy, nasal congestion and sinus pressure  Protocols used: Sinus Pain or Congestion-A-AH

## 2024-10-20 ENCOUNTER — Other Ambulatory Visit: Payer: Self-pay

## 2024-10-20 MED ORDER — AMOXICILLIN 875 MG PO TABS: 875.0000 mg | ORAL_TABLET | Freq: Two times a day (BID) | ORAL | 0 refills | Status: AC

## 2024-10-20 MED ORDER — METHYLPREDNISOLONE 4 MG PO TBPK: ORAL_TABLET | ORAL | 0 refills | Status: DC

## 2024-10-20 NOTE — Telephone Encounter (Signed)
 Antibiotic and steroid pack sent to [facility]

## 2024-10-20 NOTE — Telephone Encounter (Signed)
 Pt advised he is going to get it now

## 2024-10-20 NOTE — Telephone Encounter (Signed)
 Refilled yesterday.

## 2024-10-23 ENCOUNTER — Emergency Department (HOSPITAL_COMMUNITY)
Admission: EM | Admit: 2024-10-23 | Discharge: 2024-10-23 | Disposition: A | Discharge status: Home / Self Care | Attending: Emergency Medicine | Admitting: Emergency Medicine

## 2024-10-23 ENCOUNTER — Other Ambulatory Visit: Payer: Self-pay

## 2024-10-23 ENCOUNTER — Encounter (HOSPITAL_COMMUNITY): Payer: Self-pay | Admitting: *Deleted

## 2024-10-23 DIAGNOSIS — J069 Acute upper respiratory infection, unspecified: Secondary | ICD-10-CM | POA: Insufficient documentation

## 2024-10-23 DIAGNOSIS — I129 Hypertensive chronic kidney disease with stage 1 through stage 4 chronic kidney disease, or unspecified chronic kidney disease: Secondary | ICD-10-CM | POA: Insufficient documentation

## 2024-10-23 DIAGNOSIS — N189 Chronic kidney disease, unspecified: Secondary | ICD-10-CM | POA: Diagnosis not present

## 2024-10-23 DIAGNOSIS — R059 Cough, unspecified: Secondary | ICD-10-CM | POA: Diagnosis present

## 2024-10-23 LAB — RESP PANEL BY RT-PCR (RSV, FLU A&B, COVID)  RVPGX2
Influenza A by PCR: NEGATIVE
Influenza B by PCR: NEGATIVE
Resp Syncytial Virus by PCR: NEGATIVE
SARS Coronavirus 2 by RT PCR: NEGATIVE

## 2024-10-23 MED ORDER — FLUTICASONE PROPIONATE 50 MCG/ACT NA SUSP: 2.0000 | Freq: Every day | NASAL | 0 refills | Status: DC

## 2024-10-23 MED ORDER — BENZONATATE 100 MG PO CAPS: 100.0000 mg | ORAL_CAPSULE | Freq: Three times a day (TID) | ORAL | 0 refills | Status: DC

## 2024-10-23 NOTE — Discharge Instructions (Addendum)
 It was a pleasure to take care of you today.  You were evaluated for cough and congestion ongoing for the past 4 to 5 days.  Your symptoms are consistent with a viral illness, there is no sign of need for antibiotics at this time.  Your vital signs and exam were reassuring, I am prescribing you Flonase to help with your nasal congestion and Tessalon Perles to help with cough.  Drink plenty of fluids and rest.  Come back to the ER for any new or worsening symptoms.  Follow-up closely with your primary care doctor.

## 2024-10-23 NOTE — ED Provider Notes (Signed)
 [place] EMERGENCY DEPARTMENT AT [facility] Provider Note   CSN: [id number] Arrival date & time: 10/23/24  1002     Patient presents with: URI   Ralph Marquez is a 68 y.o. male.  Presents ER today for 4 to 5 days of cough, chest congestion, sinus pressure.  Cough obstructive symptoms of clear to whitish sputum.  He called his PCP who would call him in a Z-Pak but he had given this to his wife who ultimately did not take it either.  Denies fever or chills, no shortness of breath, no chest pain.  He denies any history of pulmonary disease.  Have history of hypertension and high cholesterol and CKD  He states he primarily came in today to bring his wife to the hospital but he also wanted to be evaluated though he states he is starting to feel better.    URI      Prior to Admission medications   Medication Sig Start Date End Date Taking? Authorizing Provider  benzonatate (TESSALON) 100 MG capsule Take 1 capsule (100 mg total) by mouth every 8 (eight) hours. 10/23/24  Yes Chayton Murata A, PA-C  fluticasone (FLONASE) 50 MCG/ACT nasal spray Place 2 sprays into both nostrils daily for 7 days. 10/23/24 10/30/24 Yes Kynzi Levay A, PA-C  amoxicillin (AMOXIL) 875 MG tablet Take 1 tablet (875 mg total) by mouth 2 (two) times daily for 7 days. 10/20/24 10/27/24  Bevely Doffing, FNP  diltiazem  (CARDIZEM  CD) 360 MG 24 hr capsule Take 1 capsule (360 mg total) by mouth daily. 09/01/24   Bacchus, Meade PEDLAR, FNP  empagliflozin  (JARDIANCE ) 10 MG TABS tablet Take 1 tablet (10 mg total) by mouth daily. 09/01/24   Bacchus, Gloria Z, FNP  HYDROcodone -acetaminophen  (NORCO/VICODIN) 5-325 MG tablet Take 1 tablet by mouth every 4 (four) hours as needed for up to 120 doses for moderate pain (pain score 4-6). 10/17/24   Finnegan, Timothy J, MD  methylPREDNISolone (MEDROL DOSEPAK) 4 MG TBPK tablet Take as package instructions. 10/20/24   Bevely Doffing, FNP  pantoprazole  (PROTONIX ) 40 MG tablet  Take 1 tablet (40 mg total) by mouth 2 (two) times daily before a meal. 09/01/24   Bacchus, Meade PEDLAR, FNP  pravastatin  (PRAVACHOL ) 20 MG tablet Take 1 tablet (20 mg total) by mouth daily. 09/01/24   Bacchus, Meade PEDLAR, FNP  spironolactone  (ALDACTONE ) 25 MG tablet Take 0.5 tablets (12.5 mg total) by mouth daily. 09/01/24 08/27/25  BacchusMeade PEDLAR, FNP  triazolam  (HALCION ) 0.25 MG tablet Take 1 tablet (0.25 mg total) by mouth at bedtime as needed for sleep. 10/19/24   Bevely Doffing, FNP  Vitamin D , Ergocalciferol , (DRISDOL ) 1.25 MG (50000 UNIT) CAPS capsule Take 1 capsule (50,000 Units total) by mouth every 7 (seven) days. 06/02/24   Bevely Doffing, FNP    Allergies: Amlodipine , Lisinopril, Losartan potassium, Oxycodone , and Betadine [povidone iodine]    Review of Systems  Updated Vital Signs BP (!) 143/104   Pulse 83   Temp (!) 97.4 F (36.3 C)   Resp 16   Ht 5' 10 (1.778 m)   Wt 97.5 kg   SpO2 95%   BMI 30.84 kg/m   Physical Exam Vitals and nursing note reviewed.  Constitutional:      General: He is not in acute distress.    Appearance: He is well-developed.  HENT:     Head: Normocephalic and atraumatic.     Right Ear: Tympanic membrane normal.     Left Ear: Tympanic membrane  normal.     Nose: Congestion present.     Comments: No sinus tenderness    Mouth/Throat:     Mouth: Mucous membranes are moist.     Pharynx: No oropharyngeal exudate or posterior oropharyngeal erythema.  Eyes:     Extraocular Movements: Extraocular movements intact.     Conjunctiva/sclera: Conjunctivae normal.     Pupils: Pupils are equal, round, and reactive to light.  Cardiovascular:     Rate and Rhythm: Normal rate and regular rhythm.     Heart sounds: No murmur heard. Pulmonary:     Effort: Pulmonary effort is normal. No respiratory distress.     Breath sounds: Normal breath sounds. No wheezing, rhonchi or rales.  Abdominal:     Palpations: Abdomen is soft.     Tenderness: There is no abdominal  tenderness.  Musculoskeletal:        General: No swelling.     Cervical back: Neck supple.  Skin:    General: Skin is warm and dry.     Capillary Refill: Capillary refill takes less than 2 seconds.  Neurological:     General: No focal deficit present.     Mental Status: He is alert and oriented to person, place, and time.  Psychiatric:        Mood and Affect: Mood normal.     (all labs ordered are listed, but only abnormal results are displayed) Labs Reviewed  RESP PANEL BY RT-PCR (RSV, FLU A&B, COVID)  [id number]    EKG: None  Radiology: No results found.   Procedures   Medications Ordered in the ED - No data to display                                  Medical Decision Making Differential diagnosis includes but not limited to pneumonia, sinusitis, otitis media, pharyngitis, URI, other Course: Patient is here for 4 to 5 days of cough and congestion being managed with OTC Alka-Seltzer.  He is well-appearing and well-hydrated with reassuring vitals though mildly hypertensive.  No chest pain or shortness of breath, lung sounds are clear and he reports he is starting to feel better and primarily came because his wife was having worsened symptoms.  We discussed that at this time there is no indication for antibiotics as there are no signs of a bacterial illness, will treat with supportive care including Tessalon Perles for cough and Flonase for nasal congestion.  He is using a Nettie pot also and he is encouraged to continue this.  Advised on follow-up and strict return precautions.    Amount and/or Complexity of Data Reviewed External Data Reviewed: notes. Labs:     Details: Flu and RSV negative  Risk Prescription drug management.        Final diagnoses:  Viral upper respiratory tract infection    ED Discharge Orders          Ordered    fluticasone (FLONASE) 50 MCG/ACT nasal spray  Daily        10/23/24 1325    benzonatate (TESSALON) 100 MG capsule  Every 8 hours         10/23/24 1326               Suellen Sherran DELENA DEVONNA 10/23/24 1328    Yolande Lamar BROCKS, MD 10/25/24 1250

## 2024-10-23 NOTE — ED Triage Notes (Signed)
 Pt c/o URI symptoms and states he has been on a zpack since Wednesday with no relief  Pt sates he shared the antibiotic with his significant other because she was feeling bad too

## 2024-10-26 ENCOUNTER — Inpatient Hospital Stay: Admitting: Hospice and Palliative Medicine

## 2024-10-26 DIAGNOSIS — C7951 Secondary malignant neoplasm of bone: Secondary | ICD-10-CM

## 2024-10-26 NOTE — Progress Notes (Signed)
 Multidisciplinary Oncology Council Documentation  Ralph Marquez was presented by our [facility] on 10/26/2024, which included representatives from:  Palliative Care Dietitian  Physical/Occupational Therapist Nurse Navigator Genetics Social work Survivorship RN Financial Navigator Research RN   Pamela currently presents with history of bone lesions  We reviewed previous medical and familial history, history of present illness, and recent lab results along with all available histopathologic and imaging studies. The MOC considered available treatment options and made the following recommendations/referrals:  None currently  The MOC is a meeting of clinicians from various specialty areas who evaluate and discuss patients for whom a multidisciplinary approach is being considered. Final determinations in the plan of care are those of the provider(s).   Today's extended care, comprehensive team conference, Ralph Marquez was not present for the discussion and was not examined.

## 2024-10-27 ENCOUNTER — Ambulatory Visit: Payer: Self-pay

## 2024-10-27 NOTE — Telephone Encounter (Signed)
 FYI Only or Action Required?: Action required by provider: Pt is requesting medications be called in.  Patient was last seen in primary care on 10/04/2024 by Bevely Doffing, FNP.  Called Nurse Triage reporting Cough. Sinus congestion and pain. Pt reports a lot of pain behind his eyes.   Symptoms began several weeks ago.  Interventions attempted: Other: Taken Antibiotics, steroids and tessalon  pearls. Seen at ED.  Symptoms are: Worsening.  Triage Disposition: See Physician Within 24 Hours  Patient/caregiver understands and will follow disposition?: Yes                  Copied from CRM [id number]. Topic: Clinical - Red Word Triage >> Oct 27, 2024 10:52 AM Leonette SQUIBB wrote: Red Word that prompted transfer to Nurse Triage: atient called wanting to be seen.  He has had congestion, cough and other cold symptoms for two weeks.  He and his wife went to the ER on Sunday.  They did not give him anything to help him.  There are no appts until monday Reason for Disposition  [1] Using nasal washes and pain medicine > 24 hours AND [2] sinus pain (around cheekbone or eye) persists  Answer Assessment - Initial Assessment Questions 1. ONSET: When did the cough begin?      2 weeks 2. SEVERITY: How bad is the cough today?      Mild  3. SPUTUM: Describe the color of your sputum (e.g., none, dry cough; clear, white, yellow, green)     white 4. HEMOPTYSIS: Are you coughing up any blood? If Yes, ask: How much? (e.g., flecks, streaks, tablespoons, etc.)     no 5. DIFFICULTY BREATHING: Are you having difficulty breathing? If Yes, ask: How bad is it? (e.g., mild, moderate, severe)      no 6. FEVER: Do you have a fever? If Yes, ask: What is your temperature, how was it measured, and when did it start?     *No Answer* 7. CARDIAC HISTORY: Do you have any history of heart disease? (e.g., heart attack, congestive heart failure)      *No Answer* 8. LUNG HISTORY: Do you have any  history of lung disease?  (e.g., pulmonary embolus, asthma, emphysema)     *No Answer* 9. PE RISK FACTORS: Do you have a history of blood clots? (or: recent major surgery, recent prolonged travel, bedridden)     *No Answer* 10. OTHER SYMPTOMS: Do you have any other symptoms? (e.g., runny nose, wheezing, chest pain)       Pain behind eyes, chest congestion. Sinus congestion is starting to be green. Chest feels heavy 11. PREGNANCY: Is there any chance you are pregnant? When was your last menstrual period?       *No Answer* 12. TRAVEL: Have you traveled out of the country in the last month? (e.g., travel history, exposures)       *No Answer*  Protocols used: Cough - Acute Productive-A-AH

## 2024-10-28 ENCOUNTER — Other Ambulatory Visit: Payer: Self-pay

## 2024-10-28 ENCOUNTER — Ambulatory Visit (INDEPENDENT_AMBULATORY_CARE_PROVIDER_SITE_OTHER)

## 2024-10-28 VITALS — BP 127/88 | HR 94 | Ht 70.0 in | Wt 214.9 lb

## 2024-10-28 DIAGNOSIS — J0140 Acute pansinusitis, unspecified: Secondary | ICD-10-CM

## 2024-10-28 MED ORDER — TRIAMCINOLONE ACETONIDE 40 MG/ML IJ SUSP
40.0000 mg | Freq: Once | INTRAMUSCULAR | Status: AC
  Administered 2024-10-28: 40 mg via INTRAMUSCULAR

## 2024-10-28 MED ORDER — AMOXICILLIN-POT CLAVULANATE 875-125 MG PO TABS: 1.0000 | ORAL_TABLET | Freq: Two times a day (BID) | ORAL | 0 refills | Status: AC

## 2024-10-28 NOTE — Progress Notes (Unsigned)
 Established Patient Office Visit  Subjective   Patient ID: Ralph Marquez, male    DOB: 1956/10/03  Age: 68 y.o. MRN: [id number]  Chief Complaint  Patient presents with   Medical Management of Chronic Issues    Pt states really congested in his head    HPI Discussed the use of AI scribe software for clinical note transcription with the patient, who gave verbal consent to proceed.  History of Present Illness    Ralph Marquez is a 68 year old male who presents with persistent cough and sinus symptoms.  Cough and upper respiratory symptoms - Persistent cough since Monday - No improvement after completing a course of azithromycin (Z-Pak) and antibiotics - No relief with 'pearls' and nasal spray prescribed at hospital visit on Sunday - Nasal symptoms include dry nose, occasional greenish mucus, and sensation of bleeding when using nasal spray - Discontinued nasal spray due to adverse effects - Using neti pot, Alka-Seltzer Plus cold medicine, and Sudafed for symptom relief - COVID-19 ruled out during hospital visit  Headache and constitutional symptoms - Severe headache - Marked weakness, mostly bedridden - Sensation of eyes 'bleeding'  Recurrent sinus and respiratory symptoms - Similar symptoms occur annually with weather changes - Previously treated with steroid shots and antibiotics by another physician      Patient Active Problem List   Diagnosis Date Noted   Acute non-recurrent pansinusitis 10/31/2024   Metastatic cancer to bone (HCC) 10/03/2024   Other fatigue 08/16/2024   AKI (acute kidney injury) 07/31/2024   Class 1 obesity 07/31/2024   Nephrolithiasis 07/31/2024   Pancreatic lesion 07/27/2024   Kidney stone 07/27/2024   Hepatic steatosis 07/27/2024   Left lower quadrant abdominal pain 07/27/2024   Primary osteoarthritis of left hip 07/25/2024   Chronic kidney disease 05/20/2024   Chronic kidney disease, stage 3a (HCC) 05/20/2024   Vitamin D  deficiency  05/20/2024   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, 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/2024   Essential hypertension 01/29/2024   Mixed hyperlipidemia 01/29/2024   GERD (gastroesophageal reflux disease) 01/29/2024   Psychophysiologic insomnia 01/29/2024   Primary osteoarthritis of left knee 01/29/2024   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  Constitutional:      Appearance: Normal appearance.  HENT:     Head: Normocephalic.     Right Ear: Tympanic membrane, ear canal and external ear normal.     Left Ear: Tympanic membrane, ear canal and external ear normal.     Nose: Congestion and rhinorrhea present.     Right Turbinates: Enlarged.     Left Turbinates: Enlarged.     Right Sinus: Maxillary sinus tenderness and frontal sinus tenderness present.     Left Sinus: Maxillary sinus tenderness and frontal sinus tenderness present.     Mouth/Throat:     Mouth: Mucous membranes  are moist.     Pharynx: Oropharynx is clear.  Eyes:     Extraocular Movements: Extraocular movements intact.     Pupils: Pupils are equal, round, and reactive to light.  Pulmonary:     Effort: Pulmonary effort is normal.     Breath sounds: Normal breath sounds.  Skin:    General: Skin is warm and dry.  Neurological:     Mental Status: He is alert and oriented to person, place, and time.  Psychiatric:        Mood and Affect: Mood normal.        Thought Content: Thought content normal.     No results found for  any visits on 10/28/24.    The 10-year ASCVD risk score (Arnett DK, et al., 2019) is: 29.1%    Assessment & Plan:   Problem List Items Addressed This Visit       Respiratory   Acute non-recurrent pansinusitis - Primary   Persistent symptoms despite azithromycin and steroid pack. Flonase  exacerbated symptoms. Consider recurrent sinusitis, possibly weather-related. Previous treatments ineffective. - Administered steroid injection. - Prescribed different antibiotic, sent prescription to [facility]. - Advised discontinuation of Flonase  nasal spray. - Recommended continued use of neti pot or saline spray.      Relevant Medications   amoxicillin -clavulanate (AUGMENTIN ) 875-125 MG tablet    No follow-ups on file.    [name] Longs, FNP

## 2024-10-31 DIAGNOSIS — J0140 Acute pansinusitis, unspecified: Secondary | ICD-10-CM | POA: Insufficient documentation

## 2024-10-31 NOTE — Assessment & Plan Note (Signed)
 Persistent symptoms despite azithromycin and steroid pack. Flonase  exacerbated symptoms. Consider recurrent sinusitis, possibly weather-related. Previous treatments ineffective. - Administered steroid injection. - Prescribed different antibiotic, sent prescription to [facility]. - Advised discontinuation of Flonase  nasal spray. - Recommended continued use of neti pot or saline spray.

## 2024-11-14 ENCOUNTER — Telehealth: Payer: Self-pay

## 2024-11-14 ENCOUNTER — Other Ambulatory Visit: Payer: Self-pay

## 2024-11-14 DIAGNOSIS — F5104 Psychophysiologic insomnia: Secondary | ICD-10-CM

## 2024-11-14 MED ORDER — TRIAZOLAM 0.25 MG PO TABS: 0.2500 mg | ORAL_TABLET | Freq: Every evening | ORAL | 0 refills | Status: DC | PRN

## 2024-11-14 NOTE — Telephone Encounter (Signed)
 Prescription Request  11/14/2024  LOV: 10/28/2024  What is the name of the medication or equipment?  triazolam  (HALCION ) 0.25 MG tablet [[id number]]   Have you contacted your pharmacy to request a refill? No   Which pharmacy would you like this sent to?  [facility]  Patient notified that their request is being sent to the clinical staff for review and that they should receive a response within 2 business days.   Please advise at called the office

## 2024-11-14 NOTE — Telephone Encounter (Signed)
 Refill sent to pharmacy.

## 2024-11-18 ENCOUNTER — Other Ambulatory Visit: Payer: Self-pay

## 2024-11-18 DIAGNOSIS — E559 Vitamin D deficiency, unspecified: Secondary | ICD-10-CM

## 2024-11-20 NOTE — Progress Notes (Signed)
 Pharmacy Quality Measure Review  This patient is appearing on a report for being at risk of failing the adherence measure for diabetes medications this calendar year.   Medication: Jardiance  10 mg Last fill date: 11/09/24 for 30 day supply  Insurance report was not up to date. No action needed at this time.   Jenkins Graces, PharmD PGY1 Pharmacy Resident [phone]

## 2024-11-23 ENCOUNTER — Telehealth: Payer: Self-pay

## 2024-11-23 NOTE — Telephone Encounter (Signed)
 Pt just wanted to inform laura he  is having hip surgery jan 15 and if any appts is needed to contact him to schedule

## 2024-11-24 ENCOUNTER — Ambulatory Visit

## 2024-11-30 ENCOUNTER — Other Ambulatory Visit: Payer: Self-pay | Admitting: Surgery

## 2024-12-16 ENCOUNTER — Encounter
Admission: RE | Admit: 2024-12-16 | Discharge: 2024-12-16 | Disposition: A | Source: Ambulatory Visit | Attending: Surgery

## 2024-12-16 ENCOUNTER — Encounter: Payer: Self-pay | Admitting: Surgery

## 2024-12-16 ENCOUNTER — Other Ambulatory Visit: Payer: Self-pay

## 2024-12-16 VITALS — BP 119/79 | HR 64 | Temp 98.2°F | Resp 20 | Ht 70.0 in | Wt 218.0 lb

## 2024-12-16 DIAGNOSIS — E119 Type 2 diabetes mellitus without complications: Secondary | ICD-10-CM | POA: Insufficient documentation

## 2024-12-16 DIAGNOSIS — R001 Bradycardia, unspecified: Secondary | ICD-10-CM | POA: Diagnosis not present

## 2024-12-16 DIAGNOSIS — Z01812 Encounter for preprocedural laboratory examination: Secondary | ICD-10-CM

## 2024-12-16 DIAGNOSIS — Z01818 Encounter for other preprocedural examination: Secondary | ICD-10-CM | POA: Diagnosis present

## 2024-12-16 DIAGNOSIS — Z01810 Encounter for preprocedural cardiovascular examination: Secondary | ICD-10-CM | POA: Diagnosis not present

## 2024-12-16 DIAGNOSIS — M1712 Unilateral primary osteoarthritis, left knee: Secondary | ICD-10-CM | POA: Diagnosis not present

## 2024-12-16 HISTORY — DX: Personal history of urinary calculi: Z87.442

## 2024-12-16 LAB — TYPE AND SCREEN
ABO/RH(D): A POS
Antibody Screen: NEGATIVE

## 2024-12-16 LAB — COMPREHENSIVE METABOLIC PANEL WITH GFR
ALT: 28 U/L (ref 0–44)
AST: 21 U/L (ref 15–41)
Albumin: 4.2 g/dL (ref 3.5–5.0)
Alkaline Phosphatase: 83 U/L (ref 38–126)
Anion gap: 14 (ref 5–15)
BUN: 22 mg/dL (ref 8–23)
CO2: 21 mmol/L — ABNORMAL LOW (ref 22–32)
Calcium: 9.7 mg/dL (ref 8.9–10.3)
Chloride: 106 mmol/L (ref 98–111)
Creatinine, Ser: 1.17 mg/dL (ref 0.61–1.24)
GFR, Estimated: >60 mL/min
Glucose, Bld: 81 mg/dL (ref 70–99)
Potassium: 4.1 mmol/L (ref 3.5–5.1)
Sodium: 140 mmol/L (ref 135–145)
Total Bilirubin: 0.4 mg/dL (ref 0.0–1.2)
Total Protein: 7.6 g/dL (ref 6.5–8.1)

## 2024-12-16 LAB — CBC WITH DIFFERENTIAL/PLATELET
Abs Immature Granulocytes: 0.09 K/uL — ABNORMAL HIGH (ref 0.00–0.07)
Basophils Absolute: 0.1 K/uL (ref 0.0–0.1)
Basophils Relative: 1 %
Eosinophils Absolute: 0.1 K/uL (ref 0.0–0.5)
Eosinophils Relative: 1 %
HCT: 45.1 % (ref 39.0–52.0)
Hemoglobin: 14.6 g/dL (ref 13.0–17.0)
Immature Granulocytes: 1 %
Lymphocytes Relative: 17 %
Lymphs Abs: 1.9 K/uL (ref 0.7–4.0)
MCH: 29.8 pg (ref 26.0–34.0)
MCHC: 32.4 g/dL (ref 30.0–36.0)
MCV: 92 fL (ref 80.0–100.0)
Monocytes Absolute: 0.7 K/uL (ref 0.1–1.0)
Monocytes Relative: 6 %
Neutro Abs: 8.3 K/uL — ABNORMAL HIGH (ref 1.7–7.7)
Neutrophils Relative %: 74 %
Platelets: 339 K/uL (ref 150–400)
RBC: 4.9 MIL/uL (ref 4.22–5.81)
RDW: 13.6 % (ref 11.5–15.5)
WBC: 11.3 K/uL — ABNORMAL HIGH (ref 4.0–10.5)
nRBC: 0 % (ref 0.0–0.2)

## 2024-12-16 LAB — URINALYSIS, ROUTINE W REFLEX MICROSCOPIC
Bacteria, UA: NONE SEEN
Bilirubin Urine: NEGATIVE
Glucose, UA: >=500 mg/dL — AB
Ketones, ur: NEGATIVE mg/dL
Leukocytes,Ua: NEGATIVE
Nitrite: NEGATIVE
Protein, ur: NEGATIVE mg/dL
Specific Gravity, Urine: 1.028 (ref 1.005–1.030)
pH: 5 (ref 5.0–8.0)

## 2024-12-16 LAB — SURGICAL PCR SCREEN
MRSA, PCR: NEGATIVE
Staphylococcus aureus: NEGATIVE

## 2024-12-16 NOTE — Patient Instructions (Signed)
 Your procedure is scheduled on: Jan 15 Report to the Registration Desk on the 1st floor of the [facility] To find out your arrival time, please call [phone] between 1PM - 3PM on: Jan 14 If your arrival time is 6:00 am, do not arrive before that time as the [facility] entrance doors do not open until 6:00 am.  REMEMBER: Instructions that are not followed completely may result in serious medical risk, up to and including death; or upon the discretion of your surgeon and anesthesiologist your surgery may need to be rescheduled.  Do not eat food after midnight the night before surgery.  No gum chewing or hard candies.  You may however, drink CLEAR liquids up to 2 hours before you are scheduled to arrive for your surgery. Do not drink anything within 2 hours of your scheduled arrival time.  Clear liquids include: - water    In addition, your doctor has ordered for you to drink the provided:   Gatorade G2 Drinking this carbohydrate drink up to two hours before surgery helps to reduce insulin  resistance and improve patient outcomes. Please complete drinking 2 hours before scheduled arrival time.  One week prior to surgery: Stop Anti-inflammatories (NSAIDS) such as Advil, Aleve, Ibuprofen, Motrin, Naproxen, Naprosyn and Aspirin  based products such as Excedrin, Goody's Powder, BC Powder. Stop ANY OVER THE COUNTER supplements until after surgery like Vit D.  You may however, continue to take Tylenol  if needed for pain up until the day of surgery.   Hold Jardiance  for three days prior to surgery. Last day is Jan 11  Follow recommendations regarding stopping blood thinners.- Follow your physicians instructions about aspirin .  Continue taking all of your other prescription medications up until the day of surgery.  ON THE DAY OF SURGERY ONLY TAKE THESE MEDICATIONS WITH SIPS OF WATER :  diltiazem  (CARDIZEM  CD) pantoprazole  (PROTONIX )     No Alcohol for 24 hours before or after  surgery.  No Smoking including e-cigarettes for 24 hours before surgery.  No chewable tobacco products for at least 6 hours before surgery.  No nicotine patches on the day of surgery.  Do not use any recreational drugs for at least a week (preferably 2 weeks) before your surgery.  Please be advised that the combination of cocaine and anesthesia may have negative outcomes, up to and including death. If you test positive for cocaine, your surgery will be cancelled.  On the morning of surgery brush your teeth with toothpaste and water , you may rinse your mouth with mouthwash if you wish. Do not swallow any toothpaste or mouthwash.  Use CHG Soap or wipes as directed on instruction sheet.-provided for you. See next page  Do not wear jewelry, make-up, hairpins, clips or nail polish.   Do not wear lotions, powders, or perfumes.   Do not shave body hair from the neck down 48 hours before surgery.  Contact lenses, hearing aids and dentures may not be worn into surgery.  Do not bring valuables to the hospital. [facility] is not responsible for any missing/lost belongings or valuables.    Notify your doctor if there is any change in your medical condition (cold, fever, infection).  Wear comfortable clothing (specific to your surgery type) to the hospital.  After surgery, you can help prevent lung complications by doing breathing exercises.  Take deep breaths and cough every 1-2 hours. Your doctor may order a device called an Incentive Spirometer to help you take deep breaths.  If you are being  admitted to the hospital overnight, leave your suitcase in the car. After surgery it may be brought to your room.  In case of increased patient census, it may be necessary for you, the patient, to continue your postoperative care in the Same Day Surgery department.  If you are being discharged the day of surgery, you will not be allowed to drive home. You will need a responsible individual to  drive you home and stay with you for 24 hours after surgery.   If you are taking public transportation, you will need to have a responsible individual with you.  Please call the Pre-admissions Testing Dept. at [phone] if you have any questions about these instructions.  Surgery Visitation Policy:  Patients having surgery or a procedure may have two visitors.  Children under the age of 76 must have an adult with them who is not the patient.  Inpatient Visitation:    Visiting hours are 7 a.m. to 8 p.m. Up to four visitors are allowed at one time in a patient room. The visitors may rotate out with other people during the day.  One visitor age 25 or older may stay with the patient overnight and must be in the room by 8 p.m.   Merchandiser, Retail to address health-related social needs:  [email or web address]    Pre-operative 4 CHG Bath Instructions   You can play a key role in reducing the risk of infection after surgery. Your skin needs to be as free of germs as possible. You can reduce the number of germs on your skin by washing with CHG (chlorhexidine  gluconate) soap before surgery. CHG is an antiseptic soap that kills germs and continues to kill germs even after washing.   DO NOT use if you have an allergy to chlorhexidine /CHG or antibacterial soaps. If your skin becomes reddened or irritated, stop using the CHG and notify one of our RNs at [phone].   Please shower with the CHG soap starting 4 days before surgery using the following schedule:       Please keep in mind the following:  DO NOT shave, including legs and underarms, starting the day of your first shower.   You may shave your face at any point before/day of surgery.  Place clean sheets on your bed the day you start using CHG soap. Use a clean washcloth (not used since being washed) for each shower. DO NOT sleep with pets once you start using the CHG.   CHG Shower Instructions:  If you  choose to wash your hair and private area, wash first with your normal shampoo/soap.  After you use shampoo/soap, rinse your hair and body thoroughly to remove shampoo/soap residue.  Turn the water  OFF and apply about 3 tablespoons (45 ml) of CHG soap to a CLEAN washcloth.  Apply CHG soap ONLY FROM YOUR NECK DOWN TO YOUR TOES (washing for 3-5 minutes)  DO NOT use CHG soap on face, private areas, open wounds, or sores.  Pay special attention to the area where your surgery is being performed.  If you are having back surgery, having someone wash your back for you may be helpful. Wait 2 minutes after CHG soap is applied, then you may rinse off the CHG soap.  Pat dry with a clean towel  Put on clean clothes/pajamas   If you choose to wear lotion, please use ONLY the CHG-compatible lotions on the back of this paper.     Additional instructions for the day of surgery: DO  NOT APPLY any lotions, deodorants, cologne, or perfumes.   Put on clean/comfortable clothes.  Brush your teeth.  Ask your nurse before applying any prescription medications to the skin.      CHG Compatible Lotions   Aveeno Moisturizing lotion  Cetaphil Moisturizing Cream  Cetaphil Moisturizing Lotion  Clairol Herbal Essence Moisturizing Lotion, Dry Skin  Clairol Herbal Essence Moisturizing Lotion, Extra Dry Skin  Clairol Herbal Essence Moisturizing Lotion, Normal Skin  Curel Age Defying Therapeutic Moisturizing Lotion with Alpha Hydroxy  Curel Extreme Care Body Lotion  Curel Soothing Hands Moisturizing Hand Lotion  Curel Therapeutic Moisturizing Cream, Fragrance-Free  Curel Therapeutic Moisturizing Lotion, Fragrance-Free  Curel Therapeutic Moisturizing Lotion, Original Formula  Eucerin Daily Replenishing Lotion  Eucerin Dry Skin Therapy Plus Alpha Hydroxy Crme  Eucerin Dry Skin Therapy Plus Alpha Hydroxy Lotion  Eucerin Original Crme  Eucerin Original Lotion  Eucerin Plus Crme Eucerin Plus Lotion  Eucerin  TriLipid Replenishing Lotion  Keri Anti-Bacterial Hand Lotion  Keri Deep Conditioning Original Lotion Dry Skin Formula Softly Scented  Keri Deep Conditioning Original Lotion, Fragrance Free Sensitive Skin Formula  Keri Lotion Fast Absorbing Fragrance Free Sensitive Skin Formula  Keri Lotion Fast Absorbing Softly Scented Dry Skin Formula  Keri Original Lotion  Keri Skin Renewal Lotion Keri Silky Smooth Lotion  Keri Silky Smooth Sensitive Skin Lotion  Nivea Body Creamy Conditioning Oil  Nivea Body Extra Enriched Lotion  Nivea Body Original Lotion  Nivea Body Sheer Moisturizing Lotion Nivea Crme  Nivea Skin Firming Lotion  NutraDerm 30 Skin Lotion  NutraDerm Skin Lotion  NutraDerm Therapeutic Skin Cream  NutraDerm Therapeutic Skin Lotion  ProShield Protective Hand Cream  Provon moisturizing lotion  How to Use an Incentive Spirometer  An incentive spirometer is a tool that measures how well you are filling your lungs with each breath. Learning to take long, deep breaths using this tool can help you keep your lungs clear and active. This may help to reverse or lessen your chance of developing breathing (pulmonary) problems, especially infection. You may be asked to use a spirometer: After a surgery. If you have a lung problem or a history of smoking. After a long period of time when you have been unable to move or be active. If the spirometer includes an indicator to show the highest number that you have reached, your health care provider or respiratory therapist will help you set a goal. Keep a log of your progress as told by your health care provider. What are the risks? Breathing too quickly may cause dizziness or cause you to pass out. Take your time so you do not get dizzy or light-headed. If you are in pain, you may need to take pain medicine before doing incentive spirometry. It is harder to take a deep breath if you are having pain. How to use your incentive spirometer  Sit up on  the edge of your bed or on a chair. Hold the incentive spirometer so that it is in an upright position. Before you use the spirometer, breathe out normally. Place the mouthpiece in your mouth. Make sure your lips are closed tightly around it. Breathe in slowly and as deeply as you can through your mouth, causing the piston or the ball to rise toward the top of the chamber. Hold your breath for 3-5 seconds, or for as long as possible. If the spirometer includes a coach indicator, use this to guide you in breathing. Slow down your breathing if the indicator goes above the marked  areas. Remove the mouthpiece from your mouth and breathe out normally. The piston or ball will return to the bottom of the chamber. Rest for a few seconds, then repeat the steps 10 or more times. Take your time and take a few normal breaths between deep breaths so that you do not get dizzy or light-headed. Do this every 1-2 hours when you are awake. If the spirometer includes a goal marker to show the highest number you have reached (best effort), use this as a goal to work toward during each repetition. After each set of 10 deep breaths, cough a few times. This will help to make sure that your lungs are clear. If you have an incision on your chest or abdomen from surgery, place a pillow or a rolled-up towel firmly against the incision when you cough. This can help to reduce pain while taking deep breaths and coughing. General tips When you are able to get out of bed: Walk around often. Continue to take deep breaths and cough in order to clear your lungs. Keep using the incentive spirometer until your health care provider says it is okay to stop using it. If you have been in the hospital, you may be told to keep using the spirometer at home. Contact a health care provider if: You are having difficulty using the spirometer. You have trouble using the spirometer as often as instructed. Your pain medicine is not giving  enough relief for you to use the spirometer as told. You have a fever. Get help right away if: You develop shortness of breath. You develop a cough with bloody mucus from the lungs. You have fluid or blood coming from an incision site after you cough. Summary An incentive spirometer is a tool that can help you learn to take long, deep breaths to keep your lungs clear and active. You may be asked to use a spirometer after a surgery, if you have a lung problem or a history of smoking, or if you have been inactive for a long period of time. Use your incentive spirometer as instructed every 1-2 hours while you are awake. If you have an incision on your chest or abdomen, place a pillow or a rolled-up towel firmly against your incision when you cough. This will help to reduce pain. Get help right away if you have shortness of breath, you cough up bloody mucus, or blood comes from your incision when you cough. This information is not intended to replace advice given to you by your health care provider. Make sure you discuss any questions you have with your health care provider. Document Revised: 02/13/2020 Document Reviewed: 02/13/2020 Elsevier Patient Education  2023 Elsevier Inc.    Preoperative Educational Videos for Total Hip, Knee and Shoulder Replacements  To better prepare for surgery, please view our videos that explain the physical activity and discharge planning required to have the best surgical recovery at [facility].  [email or web address]  Questions? Call [phone] or email [email or web address]

## 2024-12-20 MED ORDER — OXYMETAZOLINE HCL 0.05 % NA SOLN
NASAL | Status: AC
  Filled 2024-12-20: qty 30

## 2024-12-20 MED ORDER — LIDOCAINE-EPINEPHRINE 2 %-1:100000 IJ SOLN
INTRAMUSCULAR | Status: AC
  Filled 2024-12-20: qty 1

## 2024-12-22 ENCOUNTER — Ambulatory Visit: Admitting: Anesthesiology

## 2024-12-22 ENCOUNTER — Ambulatory Visit
Admission: RE | Admit: 2024-12-22 | Discharge: 2024-12-22 | Disposition: A | Discharge status: Home / Self Care | Attending: Surgery | Admitting: Surgery

## 2024-12-22 ENCOUNTER — Other Ambulatory Visit: Payer: Self-pay

## 2024-12-22 ENCOUNTER — Encounter
Admission: RE | Disposition: A | Discharge status: Home / Self Care | Payer: Self-pay | Source: Home / Self Care | Attending: Surgery

## 2024-12-22 ENCOUNTER — Ambulatory Visit

## 2024-12-22 ENCOUNTER — Encounter: Payer: Self-pay | Admitting: Surgery

## 2024-12-22 DIAGNOSIS — Z79899 Other long term (current) drug therapy: Secondary | ICD-10-CM | POA: Insufficient documentation

## 2024-12-22 DIAGNOSIS — Z87891 Personal history of nicotine dependence: Secondary | ICD-10-CM | POA: Diagnosis not present

## 2024-12-22 DIAGNOSIS — F5104 Psychophysiologic insomnia: Secondary | ICD-10-CM

## 2024-12-22 DIAGNOSIS — E1122 Type 2 diabetes mellitus with diabetic chronic kidney disease: Secondary | ICD-10-CM | POA: Insufficient documentation

## 2024-12-22 DIAGNOSIS — M1990 Unspecified osteoarthritis, unspecified site: Secondary | ICD-10-CM | POA: Insufficient documentation

## 2024-12-22 DIAGNOSIS — Z01812 Encounter for preprocedural laboratory examination: Secondary | ICD-10-CM

## 2024-12-22 DIAGNOSIS — I10 Essential (primary) hypertension: Secondary | ICD-10-CM

## 2024-12-22 DIAGNOSIS — I129 Hypertensive chronic kidney disease with stage 1 through stage 4 chronic kidney disease, or unspecified chronic kidney disease: Secondary | ICD-10-CM | POA: Diagnosis not present

## 2024-12-22 DIAGNOSIS — M1612 Unilateral primary osteoarthritis, left hip: Secondary | ICD-10-CM | POA: Diagnosis present

## 2024-12-22 DIAGNOSIS — K219 Gastro-esophageal reflux disease without esophagitis: Secondary | ICD-10-CM | POA: Diagnosis not present

## 2024-12-22 DIAGNOSIS — Z7984 Long term (current) use of oral hypoglycemic drugs: Secondary | ICD-10-CM | POA: Insufficient documentation

## 2024-12-22 DIAGNOSIS — Z8673 Personal history of transient ischemic attack (TIA), and cerebral infarction without residual deficits: Secondary | ICD-10-CM | POA: Insufficient documentation

## 2024-12-22 DIAGNOSIS — N183 Chronic kidney disease, stage 3 unspecified: Secondary | ICD-10-CM | POA: Diagnosis not present

## 2024-12-22 DIAGNOSIS — M1712 Unilateral primary osteoarthritis, left knee: Secondary | ICD-10-CM

## 2024-12-22 DIAGNOSIS — E119 Type 2 diabetes mellitus without complications: Secondary | ICD-10-CM

## 2024-12-22 DIAGNOSIS — E782 Mixed hyperlipidemia: Secondary | ICD-10-CM

## 2024-12-22 HISTORY — PX: TOTAL HIP ARTHROPLASTY: SHX124

## 2024-12-22 HISTORY — DX: Unilateral primary osteoarthritis, left hip: M16.12

## 2024-12-22 LAB — GLUCOSE, CAPILLARY
Glucose-Capillary: 100 mg/dL — ABNORMAL HIGH (ref 70–99)
Glucose-Capillary: 140 mg/dL — ABNORMAL HIGH (ref 70–99)

## 2024-12-22 LAB — ABO/RH: ABO/RH(D): A POS

## 2024-12-22 MED ORDER — SODIUM CHLORIDE 0.9 % IV SOLN: INTRAVENOUS | Status: DC

## 2024-12-22 MED ORDER — BUPIVACAINE LIPOSOME 1.3 % IJ SUSP
INTRAMUSCULAR | Status: AC
  Filled 2024-12-22: qty 20

## 2024-12-22 MED ORDER — TRIAZOLAM 0.25 MG PO TABS: 0.2500 mg | ORAL_TABLET | Freq: Every day | ORAL | Status: AC

## 2024-12-22 MED ORDER — PROPOFOL 500 MG/50ML IV EMUL
INTRAVENOUS | Status: DC | PRN
  Administered 2024-12-22: 100 ug/kg/min via INTRAVENOUS

## 2024-12-22 MED ORDER — ACETAMINOPHEN 325 MG PO TABS: 325.0000 mg | ORAL_TABLET | Freq: Four times a day (QID) | ORAL | Status: DC | PRN

## 2024-12-22 MED ORDER — ACETAMINOPHEN 10 MG/ML IV SOLN
INTRAVENOUS | Status: AC
  Filled 2024-12-22: qty 100

## 2024-12-22 MED ORDER — PROPOFOL 1000 MG/100ML IV EMUL
INTRAVENOUS | Status: AC
  Filled 2024-12-22: qty 100

## 2024-12-22 MED ORDER — TRANEXAMIC ACID-NACL 1000-0.7 MG/100ML-% IV SOLN
1000.0000 mg | INTRAVENOUS | Status: AC
  Administered 2024-12-22: 1000 mg via INTRAVENOUS

## 2024-12-22 MED ORDER — CHLORHEXIDINE GLUCONATE 0.12 % MT SOLN
15.0000 mL | Freq: Once | OROMUCOSAL | Status: AC
  Administered 2024-12-22: 15 mL via OROMUCOSAL

## 2024-12-22 MED ORDER — ORAL CARE MOUTH RINSE: 15.0000 mL | Freq: Once | OROMUCOSAL | Status: AC

## 2024-12-22 MED ORDER — ONDANSETRON HCL 4 MG PO TABS: 4.0000 mg | ORAL_TABLET | Freq: Four times a day (QID) | ORAL | Status: DC | PRN

## 2024-12-22 MED ORDER — SPIRONOLACTONE 25 MG PO TABS: 12.5000 mg | ORAL_TABLET | Freq: Every evening | ORAL | Status: AC

## 2024-12-22 MED ORDER — ONDANSETRON HCL 4 MG/2ML IJ SOLN: 4.0000 mg | Freq: Four times a day (QID) | INTRAMUSCULAR | Status: DC | PRN

## 2024-12-22 MED ORDER — METOCLOPRAMIDE HCL 10 MG PO TABS: 5.0000 mg | ORAL_TABLET | Freq: Three times a day (TID) | ORAL | Status: DC | PRN

## 2024-12-22 MED ORDER — CEFAZOLIN SODIUM-DEXTROSE 2-4 GM/100ML-% IV SOLN
2.0000 g | INTRAVENOUS | Status: AC
  Administered 2024-12-22: 2 g via INTRAVENOUS

## 2024-12-22 MED ORDER — CEFAZOLIN SODIUM-DEXTROSE 2-4 GM/100ML-% IV SOLN
INTRAVENOUS | Status: AC
  Filled 2024-12-22: qty 100

## 2024-12-22 MED ORDER — ELIQUIS 2.5 MG PO TABS
2.5000 mg | ORAL_TABLET | Freq: Two times a day (BID) | ORAL | 0 refills | Status: AC
  Filled 2024-12-22: qty 30, 15d supply, fill #0

## 2024-12-22 MED ORDER — PRAVASTATIN SODIUM 20 MG PO TABS: 20.0000 mg | ORAL_TABLET | Freq: Every evening | ORAL | Status: AC

## 2024-12-22 MED ORDER — ONDANSETRON HCL 4 MG/2ML IJ SOLN
INTRAMUSCULAR | Status: DC | PRN
  Administered 2024-12-22: 4 mg via INTRAVENOUS

## 2024-12-22 MED ORDER — PHENYLEPHRINE HCL-NACL 20-0.9 MG/250ML-% IV SOLN
INTRAVENOUS | Status: DC | PRN
  Administered 2024-12-22: 10 ug/min via INTRAVENOUS

## 2024-12-22 MED ORDER — LIDOCAINE HCL (CARDIAC) PF 100 MG/5ML IV SOSY
PREFILLED_SYRINGE | INTRAVENOUS | Status: DC | PRN
  Administered 2024-12-22: 60 mg via INTRAVENOUS

## 2024-12-22 MED ORDER — ACETAMINOPHEN 10 MG/ML IV SOLN
INTRAVENOUS | Status: DC | PRN
  Administered 2024-12-22: 1000 mg via INTRAVENOUS

## 2024-12-22 MED ORDER — EPHEDRINE SULFATE-NACL 50-0.9 MG/10ML-% IV SOSY
PREFILLED_SYRINGE | INTRAVENOUS | Status: DC | PRN
  Administered 2024-12-22: 5 mg via INTRAVENOUS
  Administered 2024-12-22: 2.5 mg via INTRAVENOUS
  Administered 2024-12-22: 10 mg via INTRAVENOUS

## 2024-12-22 MED ORDER — HYDROCODONE-ACETAMINOPHEN 7.5-325 MG PO TABS
1.0000 | ORAL_TABLET | ORAL | Status: DC | PRN
  Administered 2024-12-22: 1 via ORAL
  Filled 2024-12-22: qty 1

## 2024-12-22 MED ORDER — PHENYLEPHRINE 80 MCG/ML (10ML) SYRINGE FOR IV PUSH (FOR BLOOD PRESSURE SUPPORT)
PREFILLED_SYRINGE | INTRAVENOUS | Status: DC | PRN
  Administered 2024-12-22 (×2): 80 ug via INTRAVENOUS

## 2024-12-22 MED ORDER — METOCLOPRAMIDE HCL 5 MG/ML IJ SOLN: 5.0000 mg | Freq: Three times a day (TID) | INTRAMUSCULAR | Status: DC | PRN

## 2024-12-22 MED ORDER — SODIUM CHLORIDE 0.9 % BOLUS PEDS
250.0000 mL | Freq: Once | INTRAVENOUS | Status: AC
  Administered 2024-12-22: 250 mL via INTRAVENOUS

## 2024-12-22 MED ORDER — LIDOCAINE HCL (PF) 2 % IJ SOLN
INTRAMUSCULAR | Status: AC
  Filled 2024-12-22: qty 5

## 2024-12-22 MED ORDER — PROPOFOL 10 MG/ML IV BOLUS
INTRAVENOUS | Status: DC | PRN
  Administered 2024-12-22: 60 mg via INTRAVENOUS

## 2024-12-22 MED ORDER — KETOROLAC TROMETHAMINE 15 MG/ML IJ SOLN
INTRAMUSCULAR | Status: AC
  Filled 2024-12-22: qty 1

## 2024-12-22 MED ORDER — APIXABAN 2.5 MG PO TABS: 2.5000 mg | ORAL_TABLET | Freq: Two times a day (BID) | ORAL | 0 refills | Status: AC

## 2024-12-22 MED ORDER — CEFAZOLIN SODIUM-DEXTROSE 2-4 GM/100ML-% IV SOLN
2.0000 g | Freq: Four times a day (QID) | INTRAVENOUS | Status: DC
  Administered 2024-12-22: 2 g via INTRAVENOUS

## 2024-12-22 MED ORDER — BUPIVACAINE HCL (PF) 0.5 % IJ SOLN
INTRAMUSCULAR | Status: DC | PRN
  Administered 2024-12-22: 2.5 mL

## 2024-12-22 MED ORDER — TRIAMCINOLONE ACETONIDE 40 MG/ML IJ SUSP
INTRAMUSCULAR | Status: DC | PRN
  Administered 2024-12-22: 93 mL

## 2024-12-22 MED ORDER — 0.9 % SODIUM CHLORIDE (POUR BTL) OPTIME
TOPICAL | Status: DC | PRN
  Administered 2024-12-22: 1000 mL

## 2024-12-22 MED ORDER — MIDAZOLAM HCL 5 MG/5ML IJ SOLN
INTRAMUSCULAR | Status: DC | PRN
  Administered 2024-12-22: 2 mg via INTRAVENOUS

## 2024-12-22 MED ORDER — TRIAMCINOLONE ACETONIDE 40 MG/ML IJ SUSP
INTRAMUSCULAR | Status: AC
  Filled 2024-12-22: qty 2

## 2024-12-22 MED ORDER — BUPIVACAINE-EPINEPHRINE (PF) 0.5% -1:200000 IJ SOLN
INTRAMUSCULAR | Status: AC
  Filled 2024-12-22: qty 30

## 2024-12-22 MED ORDER — GLYCOPYRROLATE 0.2 MG/ML IJ SOLN
INTRAMUSCULAR | Status: DC | PRN
  Administered 2024-12-22: .1 mg via INTRAVENOUS

## 2024-12-22 MED ORDER — SODIUM CHLORIDE (PF) 0.9 % IJ SOLN
INTRAMUSCULAR | Status: AC
  Filled 2024-12-22: qty 20

## 2024-12-22 MED ORDER — KETOROLAC TROMETHAMINE 15 MG/ML IJ SOLN
15.0000 mg | Freq: Once | INTRAMUSCULAR | Status: AC
  Administered 2024-12-22: 15 mg via INTRAVENOUS

## 2024-12-22 MED ORDER — CHLORHEXIDINE GLUCONATE 0.12 % MT SOLN
OROMUCOSAL | Status: AC
  Filled 2024-12-22: qty 15

## 2024-12-22 MED ORDER — FENTANYL CITRATE (PF) 100 MCG/2ML IJ SOLN: 25.0000 ug | INTRAMUSCULAR | Status: DC | PRN

## 2024-12-22 MED ORDER — HYDROCODONE-ACETAMINOPHEN 7.5-325 MG PO TABS: 1.0000 | ORAL_TABLET | Freq: Four times a day (QID) | ORAL | 0 refills | Status: AC | PRN

## 2024-12-22 MED ORDER — MIDAZOLAM HCL 2 MG/2ML IJ SOLN
INTRAMUSCULAR | Status: AC
  Filled 2024-12-22: qty 2

## 2024-12-22 MED ORDER — TRANEXAMIC ACID-NACL 1000-0.7 MG/100ML-% IV SOLN
INTRAVENOUS | Status: AC
  Filled 2024-12-22: qty 100

## 2024-12-22 MED ORDER — HYDROCODONE-ACETAMINOPHEN 7.5-325 MG PO TABS
2.0000 | ORAL_TABLET | Freq: Four times a day (QID) | ORAL | 0 refills | Status: AC | PRN
  Filled 2024-12-22: qty 40, 5d supply, fill #0

## 2024-12-22 NOTE — Discharge Instructions (Addendum)
 Orthopedic discharge instructions: May shower with intact OpSite dressing. Apply ice frequently to left hip. Start Eliquis  1 tablet (2.5 mg) twice daily on Friday, 12/23/2024, for 2 weeks, then take aspirin  325 mg (or 4 baby aspirin ) twice daily for 4 weeks. Take pain medication as prescribed when needed.  May supplement with ES Tylenol  if necessary. May weight-bear as tolerated on left leg - use walker for balance and support. Follow-up in 10-14 days or as scheduled.

## 2024-12-22 NOTE — Op Note (Signed)
 12/22/2024  1:23 PM  Patient:   Ralph Marquez  Pre-Op Diagnosis:   Degenerative joint disease, left hip.  Post-Op Diagnosis:   Same.  Procedure:   Left total hip arthroplasty.  Surgeon:   DOROTHA Reyes Maltos, MD  Assistant:   Toribio Alas, RNFA  Anesthesia:   Spinal  Findings:   As above.  Complications:   None  EBL:   150 cc  Fluids:   600 cc crystalloid  UOP:   None  TT:   None  Drains:   None  Closure:   Staples  Implants:   Biomet press-fit system with a #12 laterally offset Echo femoral stem, a 52 mm acetabular shell with an E-poly hi-wall liner, and a 36 mm ceramic head with a -6 mm neck adapter.  Brief Clinical Note:   The patient is a 69 year old male with a history of progressively worsening left hip/groin pain with radiation down his thigh. The symptoms have progressed despite medications, activity modification, etc. His history and examination consistent with degenerative joint disease confirmed by plain radiographs. The patient presents at this time for a left total hip arthroplasty.   Procedure:   The patient was brought into the operating room. After adequate spinal anesthesia was obtained, the patient was repositioned in the right lateral decubitus position and secured using a lateral hip positioner. The left hip and lower extremity were prepped with ChloroPrep solution before being draped sterilely. Preoperative antibiotics were administered. A timeout was performed to verify the appropriate surgical site.    A standard posterior approach to the hip was made through an approximately 6-7 inch incision. The incision was carried down through the subcutaneous tissues to expose the gluteal fascia and proximal end of the iliotibial band. These structures were split the length of the incision and the Charnley self-retaining hip retractor placed. The bursal tissues were swept posteriorly to expose the short external rotators. The anterior border of the piriformis tendon  was identified and this plane developed down through the capsule to enter the joint. A flap of tissue was elevated off the posterior aspect of the femoral neck and greater trochanter and retracted posteriorly. This flap included the piriformis tendon, the short external rotators, and the posterior capsule. The soft tissues were elevated off the lateral aspect of the ilium and a large Steinmann pin placed bicortically.   With the left leg aligned over the right, a drill bit was placed into the greater trochanter parallel to the Steinmann pin and the distance between these two pins measured in order to optimize leg lengths postoperatively. The drill bit was removed and the hip dislocated. The piriformis fossa was debrided of soft tissues before the intramedullary canal was accessed through this point using a triple step reamer. The canal was reamed sequentially beginning with a #7 tapered reamer and progressing to a #12 tapered reamer. This provided excellent circumferential chatter. Using the appropriate guide, a femoral neck cut was made 10-12 mm above the lesser trochanter. The femoral head was removed.  Attention was directed to the acetabular side. The labrum was debrided circumferentially before the ligamentum teres was removed using a large curette. A line was drawn on the drapes corresponding to the native version of the acetabulum. This line was used as a guide while the acetabulum was reamed sequentially beginning with a 45 mm reamer and progressing to a 51 mm reamer. This provided excellent circumferential chatter. The 51 mm trial acetabulum was positioned and found to fit quite well. Therefore,  the 52 mm acetabular shell was selected and impacted into place with care taken to maintain the appropriate version. The trial high wall liner was inserted.  Attention was redirected to the femoral side. A box osteotome was used to establish version before the canal was broached sequentially beginning with a  #9 broach and progressing to a # 12 broach. This was left in place and several trial reductions performed using both the standard and laterally offset neck options, as well as the -6 mm and -3 mm neck lengths. After removing the trial components, the manhole cover was placed into the apex of the acetabular shell and tightened securely.   The permanent E-polyethylene hi-wall liner was impacted into the acetabular shell and its locking mechanism verified using a quarter-inch osteotome. Next, the # 12 laterally offset femoral stem was impacted into place with care taken to maintain the appropriate version. A repeat trial reduction was performed using the -6 mm and -3 mm neck lengths. The -6 mm neck length demonstrated excellent stability both in extension and external rotation as well as with flexion to 90 and internal rotation beyond 70. It also was stable in the position of sleep. In addition, leg lengths appeared to be restored appropriately, both by reassessing the position of the right leg over the left, as well as by measuring the distance between the Steinmann pin and the drill bit. The 36 mm ceramic head with the -6 mm neck adapter was impacted onto the stem of the femoral component. The Morse taper locking mechanism was verified using manual distraction before the head was relocated and placed through a range of motion with the findings as described above.  The wound was copiously irrigated with sterile saline solution via the jet lavage system before the peri-incisional and pericapsular tissues were injected with a cocktail of 20 cc of Exparel , 30 cc of 0.5% Sensorcaine , 2 cc of Kenalog  40 (80 mg), and 30 mg of Toradol  diluted out to 90 cc with normal saline to help with postoperative analgesia. The posterior flap was reapproximated to the posterior aspect of the greater trochanter using #2 Tycron interrupted sutures placed through drill holes. Several additional #2 Tycron interrupted sutures were  used to reinforce this layer of closure. The iliotibial band was reapproximated using #1 Vicryl interrupted sutures before the gluteal fascia was closed using a running #0 Vicryl suture. The subcutaneous tissues were closed in several layers using 2-0 Vicryl interrupted sutures before the skin was closed using staples. A sterile occlusive dressing was applied to the wound. The patient was then rolled back into the supine position on his/her hospital bed before being awakened and returned to the recovery room in satisfactory condition after tolerating the procedure well.

## 2024-12-22 NOTE — Evaluation (Signed)
 Physical Therapy Evaluation Patient Details Name: Ralph Marquez MRN: [id number] DOB: [birth year] Today's Date: 12/22/2024  History of Present Illness  Pt is a 69 yo M diagnosed with degenerative joint disease of the left hip and is s/p elective L THA.  PMH includes HTN, DM, GERD, CKD, and B TKA.  Clinical Impression  Pt was pleasant and motivated to participate during the session and put forth good effort throughout. Pt required no physical assistance during the session and demonstrated good carryover of proper sequencing during the session for hip precaution compliance while performing functional tasks.  Pt was generally steady with all standing activities including during transfer, gait, and stair training with no overt LOB, instability, or buckling noted.  Pt was able to amb 2 x 60 feet with a RW and ascend/descend 4 steps x 2 all with good control and stability and with no adverse symptoms other than mild L hip pain.  Pt's SpO2 and HR were both WNL on room air measured multiple times pre and post exertion.  Pt will benefit from continued PT services upon discharge to safely address deficits listed in patient problem list for decreased caregiver assistance and eventual return to PLOF.           If plan is discharge home, recommend the following: A little help with walking and/or transfers;A little help with bathing/dressing/bathroom;Assistance with cooking/housework;Assist for transportation;Help with stairs or ramp for entrance   Can travel by private vehicle        Equipment Recommendations None recommended by PT  Recommendations for Other Services       Functional Status Assessment Patient has had a recent decline in their functional status and demonstrates the ability to make significant improvements in function in a reasonable and predictable amount of time.     Precautions / Restrictions Precautions Precautions: Fall;Posterior Hip Precaution Booklet Issued: Yes (comment) Recall  of Precautions/Restrictions: Intact Restrictions Weight Bearing Restrictions Per Provider Order: Yes LLE Weight Bearing Per Provider Order: Weight bearing as tolerated      Mobility  Bed Mobility Overal bed mobility: Modified Independent             General bed mobility comments: Min extra time and effort only    Transfers Overall transfer level: Needs assistance Equipment used: Rolling walker (2 wheels) Transfers: Sit to/from Stand Sit to Stand: Contact guard assist           General transfer comment: Min verbal and visual cues for sequencing for hip precaution compliance; good eccentric and concentric control and stability    Ambulation/Gait Ambulation/Gait assistance: Contact guard assist Gait Distance (Feet): 60 Feet x 2 Assistive device: Rolling walker (2 wheels) Gait Pattern/deviations: Step-to pattern, Step-through pattern, Decreased step length - right, Decreased step length - left, Decreased stance time - left Gait velocity: decreased     General Gait Details: Slow cadence with cues for step-to pattern initially to assess LLE stability with pt gradually self-selecting step-through pattern with no overt LOB or buckling; 90 deg L turn training x 4 to prevent closed kinetic chain L hip IR   Stairs Stairs: Yes Stairs assistance: Contact guard assist Stair Management: One rail Right, Step to pattern, Forwards Number of Stairs: 4 General stair comments: Visual demonstration followed by practice with pt able to ascend/descend 4 steps x 2 with min to mod multi-modal cuing for the initial bout followed by no cuing needed during the second bout; good control and stability throughout  Wheelchair Mobility  Tilt Bed    Modified Rankin (Stroke Patients Only)       Balance Overall balance assessment: Needs assistance   Sitting balance-Leahy Scale: Normal     Standing balance support: Bilateral upper extremity supported, During functional  activity Standing balance-Leahy Scale: Good                               Pertinent Vitals/Pain Pain Assessment Pain Assessment: 0-10 Pain Score: 3  Pain Location: L hip Pain Descriptors / Indicators: Sore Pain Intervention(s): Premedicated before session, Ice applied, Monitored during session, Repositioned    Home Living Family/patient expects to be discharged to:: Private residence Living Arrangements: Spouse/significant other Available Help at Discharge: Friend(s) Type of Home: House Home Access: Stairs to enter Entrance Stairs-Rails: Right Entrance Stairs-Number of Steps: 5   Home Layout: Two level;Able to live on main level with bedroom/bathroom Home Equipment: Rollator (4 wheels);Standard Walker;Cane - single point;BSC/3in1;Rolling Walker (2 wheels);Shower seat - built in;Grab bars - tub/shower Additional Comments: Pt's son is staying with pt through the weekend and friend Luke can assist as needed, spouse unable to provide physical assist    Prior Function Prior Level of Function : Independent/Modified Independent             Mobility Comments: Ind amb without an AD community distances, no fall history ADLs Comments: Ind with ADLs     Extremity/Trunk Assessment   Upper Extremity Assessment Upper Extremity Assessment: Overall WFL for tasks assessed    Lower Extremity Assessment Lower Extremity Assessment: Generalized weakness;LLE deficits/detail LLE Deficits / Details: BLE ankle AROM, strength, and sensation to light touch all WFL; pt able to march in place at EOB bilaterally with no buckling or instability LLE: Unable to fully assess due to pain LLE Sensation: WNL LLE Coordination: WNL       Communication   Communication Communication: No apparent difficulties    Cognition Arousal: Alert Behavior During Therapy: WFL for tasks assessed/performed   PT - Cognitive impairments: No apparent impairments                          Following commands: Intact       Cueing Cueing Techniques: Verbal cues, Tactile cues, Visual cues     General Comments      Exercises Other Exercises Other Exercises: Posterior hip precaution education and review with handout and during functional tasks Other Exercises: Car transfer sequencing education provided Other Exercises: HEP per handout   Assessment/Plan    PT Assessment Patient needs continued PT services  PT Problem List Decreased strength;Decreased activity tolerance;Decreased balance;Decreased mobility;Decreased knowledge of use of DME;Decreased knowledge of precautions;Pain       PT Treatment Interventions DME instruction;Gait training;Stair training;Functional mobility training;Therapeutic activities;Therapeutic exercise;Balance training;Patient/family education    PT Goals (Current goals can be found in the Care Plan section)  Acute Rehab PT Goals Patient Stated Goal: To walk without pain PT Goal Formulation: With patient Time For Goal Achievement: 01/04/25 Potential to Achieve Goals: Good    Frequency BID     Co-evaluation               AM-PAC PT 6 Clicks Mobility  Outcome Measure Help needed turning from your back to your side while in a flat bed without using bedrails?: None Help needed moving from lying on your back to sitting on the side of a flat bed without using bedrails?: None  Help needed moving to and from a bed to a chair (including a wheelchair)?: A Little Help needed standing up from a chair using your arms (e.g., wheelchair or bedside chair)?: A Little Help needed to walk in hospital room?: A Little Help needed climbing 3-5 steps with a railing? : A Little 6 Click Score: 20    End of Session Equipment Utilized During Treatment: Gait belt Activity Tolerance: Patient tolerated treatment well Patient left: in chair;with nursing/sitter in room Nurse Communication: Mobility status;Weight bearing status PT Visit Diagnosis: Other  abnormalities of gait and mobility (R26.89);Muscle weakness (generalized) (M62.81);Pain Pain - Right/Left: Left Pain - part of body: Hip    Time: 8382-8295 PT Time Calculation (min) (ACUTE ONLY): 47 min   Charges:   PT Evaluation $PT Eval Moderate Complexity: 1 Mod PT Treatments $Gait Training: 8-22 mins $Therapeutic Activity: 8-22 mins PT General Charges $$ ACUTE PT VISIT: 1 Visit    D. Glendia Bertin PT, DPT 12/22/24, 5:34 PM

## 2024-12-22 NOTE — Anesthesia Preprocedure Evaluation (Signed)
 "                                  Anesthesia Evaluation  Patient identified by MRN, date of birth, ID band Patient awake    Reviewed: Allergy & Precautions, NPO status , Patient's Chart, lab work & pertinent test results  History of Anesthesia Complications Negative for: history of anesthetic complications  Airway Mallampati: III  TM Distance: >3 FB Neck ROM: full    Dental  (+) Upper Dentures   Pulmonary neg pulmonary ROS, former smoker   Pulmonary exam normal        Cardiovascular hypertension, On Medications negative cardio ROS Normal cardiovascular exam     Neuro/Psych negative neurological ROS  negative psych ROS   GI/Hepatic Neg liver ROS,GERD  Medicated,,  Endo/Other  negative endocrine ROSdiabetes, Type 2    Renal/GU Renal disease     Musculoskeletal  (+) Arthritis , Osteoarthritis,    Abdominal   Peds  Hematology negative hematology ROS (+)   Anesthesia Other Findings Past Medical History: 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     Comment:  s/p  08-25-2018  IR embolization cerebral coilling x3 No date: CKD (chronic kidney disease), stage III (HCC) No date: GERD (gastroesophageal reflux disease) No date: History of kidney stones No date: Hyperlipidemia, mixed No date: Hypertension No date: Left flank pain 07/21/2024: Left ureteral calculus No date: OA (osteoarthritis) No date: Primary osteoarthritis of left hip 01/29/2024: Psychophysiologic insomnia 07/21/2024: Renal calculi     Comment:  bilateral No date: Type 2 diabetes mellitus (HCC)     Comment:  followed by pcp No date: Wears dentures     Comment:  full upper No date: Wears glasses  Past Surgical History: 08/25/2018: CEREBRAL EMBOLIZATION     Comment:  @[facility] --WS;   x3  coiling superiorly directed [employer]               aneurysm 07/31/2024: CYSTOSCOPY/URETEROSCOPY/HOLMIUM LASER/STENT PLACEMENT; Left     Comment:  Procedure: CYSTOSCOPY/URETEROSCOPY/HOLMIUM  LASER/STENT               PLACEMENT;  Surgeon: Shane Steffan BROCKS, MD;  Location:               [facility];  Service: Urology;  Laterality: Left; 08/26/2024: CYSTOSCOPY/URETEROSCOPY/HOLMIUM LASER/STENT PLACEMENT; Left     Comment:  Procedure: CYSTOSCOPY/URETEROSCOPY/HOLMIUM LASER/STENT               EXCHANGE;  Surgeon: Nieves Cough, MD;  Location: [facility]               OR;  Service: Urology;  Laterality: Left; 07/30/2017: ESOPHAGOGASTRODUODENOSCOPY; N/A     Comment:  Procedure: ESOPHAGOGASTRODUODENOSCOPY (EGD) Possible               esophageal dilation.;  Surgeon: Golda Claudis PENNER, MD;                Location: [facility];  Service: Endoscopy;                Laterality: N/A; 07/29/2024: EXTRACORPOREAL SHOCK WAVE LITHOTRIPSY; Left     Comment:  Procedure: LITHOTRIPSY, ESWL;  Surgeon: Nieves Cough, MD;  Location: [facility];  Service: Urology;                Laterality: Left; 08/03/2003: ROTATOR CUFF REPAIR W/ DISTAL CLAVICLE EXCISION  Comment:  @[facility]  by dr duda 02/09/2024: TOTAL KNEE ARTHROPLASTY; Left     Comment:  Procedure: TOTAL KNEE ARTHROPLASTY;  Surgeon: Edie Norleen PARAS, MD;  Location: [facility];  Service: Orthopedics;                Laterality: Left; 2016: TOTAL KNEE ARTHROPLASTY; Right     Comment:  @[facility]   BMI    Body Mass Index: 31.38 kg/m      Reproductive/Obstetrics negative OB ROS                              Anesthesia Physical Anesthesia Plan  ASA: 3  Anesthesia Plan: Spinal   Post-op Pain Management: Toradol  IV (intra-op)*, Ofirmev  IV (intra-op)* and Regional block*   Induction: Intravenous  PONV Risk Score and Plan: 1 and Propofol  infusion and TIVA  Airway Management Planned: Natural Airway and Nasal Cannula  Additional Equipment:   Intra-op Plan:   Post-operative Plan:   Informed Consent: I have reviewed the patients History and Physical, chart, labs and discussed the procedure including the  risks, benefits and alternatives for the proposed anesthesia with the patient or authorized representative who has indicated his/her understanding and acceptance.     Dental Advisory Given  Plan Discussed with: Anesthesiologist, CRNA and Surgeon  Anesthesia Plan Comments: (Patient reports no bleeding problems and no anticoagulant use.  Plan for spinal with backup GA  Patient consented for risks of anesthesia including but not limited to:  - adverse reactions to medications - damage to eyes, teeth, lips or other oral mucosa - nerve damage due to positioning  - risk of bleeding, infection and or nerve damage from spinal that could lead to paralysis - risk of headache or failed spinal - damage to teeth, lips or other oral mucosa - sore throat or hoarseness - damage to heart, brain, nerves, lungs, other parts of body or loss of life  Patient voiced understanding and assent.)        Anesthesia Quick Evaluation  "

## 2024-12-22 NOTE — H&P (Signed)
 History of Present Illness: Ralph Marquez is a 69 y.o. male who presents today for his surgical history and physical for upcoming left total hip arthroplasty. Surgery is scheduled with Dr. Edie on 12/22/2024. The patient denies any changes to his medical history since he was last evaluated. He continues to report an 8 out of 10 pain score in the left hip for which he has taken hydrocodone  as needed for discomfort at this time. He denies any recent trauma or injury. He does have a history of stroke in 2018. Denies any history of heart attack, no history of asthma or COPD. He is a prediabetic, he is on Jardiance . No history of DVT.  Past Medical History: Cerebral aneurysm (HHS-HCC)  Hypercholesterolemia  Hypertension  Stroke (CMS/HHS-HCC) 2018 (Mild)   Past Surgical History: Right Shoulder Arthroscopy Right 2004  Right Total Knee Arthroplasty Right 2016  Left Catarat Extraction Left 2019  ARTHROPLASTY TOTAL KNEE Left 02/09/2024 (Dr. Edie)   Past Family History: Family History  Problem Relation Age of Onset  Diabetes Mother  Myocardial Infarction (Heart attack) Mother  Stroke Mother  Stroke Sister   Medications: celecoxib  (CELEBREX ) 50 MG capsule Take 50 mg by mouth 2 (two) times daily  [START ON 12/20/2024] HYDROcodone -acetaminophen  (NORCO) 5-325 mg tablet Take 1 tablet by mouth every 6 (six) hours as needed for Pain 10 tablet 0  aspirin  81 MG EC tablet Take 81 mg by mouth once daily  cholecalciferol (VITAMIN D3) 1000 unit tablet Take 1,000 Units by mouth  cyclobenzaprine (FLEXERIL) 10 MG tablet Take 1 tablet (10 mg total) by mouth 3 (three) times daily as needed 60 tablet 0  dilTIAZem  (CARDIZEM  CD) 360 MG CD capsule Take 1 capsule by mouth once daily  empagliflozin  (JARDIANCE ) 10 mg tablet Take 10 mg by mouth every morning before breakfast  ergocalciferol , vitamin D2, 1,250 mcg (50,000 unit) capsule Take 50,000 Units by mouth every 7 (seven) days  etodolac (LODINE) 500 MG tablet  Take 1 tablet (500 mg total) by mouth 2 (two) times daily 60 tablet 1  meloxicam (MOBIC) 7.5 MG tablet TAKE ONE TABLET BY MOUTH EVERY DAY 30 tablet 1  naproxen (NAPROSYN) 500 MG tablet Take 500 mg by mouth 2 (two) times daily with meals  pantoprazole  (PROTONIX ) 40 MG DR tablet Take 1 tablet by mouth once daily  pravastatin  (PRAVACHOL ) 10 MG tablet Take 1 tablet by mouth once daily  spironolactone  (ALDACTONE ) 25 MG tablet Take 0.5 tablets by mouth once daily  triazolam  (HALCION ) 0.25 MG tablet Take 1 tablet by mouth at bedtime   Allergies: Amlodipine  Hives  Lisinopril Hives and Rash  Losartan Potassium Hives  Amlodipine  Besylate Rash  Betadine [Povidone-Iodine] Hives  Losartan Other (See Comments)  Sulfa (Sulfonamide Antibiotics) Itching  Iodine Rash  Valsartan Rash and Other (See Comments)   Review of Systems:  A comprehensive 14 point ROS was performed, reviewed by me today, and the pertinent orthopaedic findings are documented in the HPI.  Physical Exam: BP 112/74  Ht 177.8 cm (5' 10)  Wt 99.2 kg (218 lb 12.8 oz)  BMI 31.39 kg/m  General/Constitutional: The patient appears to be well-nourished, well-developed, and in no acute distress. Neuro/Psych: Normal mood and affect, oriented to person, place and time. Eyes: Non-icteric. Pupils are equal, round, and reactive to light, and exhibit synchronous movement. ENT: Unremarkable. Lymphatic: No palpable adenopathy. Respiratory: Lungs clear to auscultation, Normal chest excursion, No wheezes, and Non-labored breathing Cardiovascular: Regular rate and rhythm. No murmurs. and No edema, swelling or  tenderness, except as noted in detailed exam. Integumentary: No impressive skin lesions present, except as noted in detailed exam. Musculoskeletal: Unremarkable, except as noted in detailed exam.  Left hip exam: Skin inspection around the left hip again is unremarkable. No swelling, erythema, ecchymosis, abrasions, or other skin  abnormalities are identified. There is no tenderness to palpation over the anterolateral aspects of the hip. He can tolerate hip flexion to 90 degrees, internal rotation to 15 degrees, and external rotation to 25 degrees. He experiences moderate pain with internal and external rotation of the hip. He can arise from a seated position with moderate discomfort. In stance, his pelvis is level. He can heel raise and toe raise without difficulty, and has been able to march in place without evidence of hip abductor weakness.  Imaging: A recent MRI scan of the left hip is available for review. By report, the study demonstrates evidence of advanced degenerative changes of the left hip, as well as more moderate degenerative changes of the right hip. 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 In addition, there are multiple areas of round lesions throughout the visualized bony structures concerning for metastatic disease.   Impression: Primary osteoarthritis of left hip.  Plan:  1. Treatment options were discussed today with the patient. 2. The patient is scheduled for a left total hip arthroplasty with Dr. Edie on 12/22/2024. 3. The patient was instructed on the risk and benefits of surgical intervention and wishes to proceed at this time. 4. This document will serve as a surgical history and physical. 5. The patient will follow-up per standard postop protocol. They can call the clinic they have any questions, new symptoms develop or symptoms worsen.  The procedure was discussed with the patient, as were the potential risks (including bleeding, infection, nerve and/or blood vessel injury, persistent or recurrent pain, failure of the hardware, instability, dislocation, leg length inequality, heterotropic ossification, need for further surgery, blood clots, strokes, heart attacks and/or arhythmias, pneumonia, etc.) and benefits. The patient states his understanding  and wishes to proceed.    H&P reviewed and patient re-examined. No changes.

## 2024-12-22 NOTE — Transfer of Care (Signed)
 Immediate Anesthesia Transfer of Care Note  Patient: Ralph Marquez  Procedure(s) Performed: ARTHROPLASTY, HIP, TOTAL,POSTERIOR APPROACH (Left: Hip)  Patient Location: PACU  Anesthesia Type:General and Spinal  Level of Consciousness: drowsy  Airway & Oxygen Therapy: Patient Spontanous Breathing and Patient connected to face mask oxygen  Post-op Assessment: Report given to RN, Post -op Vital signs reviewed and stable, and Patient moving all extremities  Post vital signs: Reviewed and stable  Last Vitals:  Vitals Value Taken Time  BP 94/61 12/22/24 13:31  Temp 97 12/22/24  13:31  Pulse 43 12/22/24 13:33  Resp 17 12/22/24 13:33  SpO2 96 % 12/22/24 13:33  Vitals shown include unfiled device data.  Last Pain:  Vitals:   12/22/24 0901  PainSc: 3          Complications: No notable events documented.

## 2024-12-22 NOTE — Anesthesia Procedure Notes (Addendum)
 Spinal  Patient location during procedure: OR Start time: 12/22/2024 11:06 AM End time: 12/22/2024 11:14 AM Reason for block: surgical anesthesia  Staffing Performed: resident/CRNA  Authorized by: Chesley Lendia CROME, MD   Performed by: Myra Lawless, CRNA  Preanesthetic Checklist Completed: patient identified, IV checked, site marked, risks and benefits discussed, surgical consent, monitors and equipment checked, pre-op evaluation and timeout performed Spinal Block Patient position: sitting Prep: ChloraPrep Patient monitoring: heart rate, cardiac monitor, continuous pulse ox and blood pressure Approach: midline Location: L3-4 Injection technique: single-shot Needle Needle type: Quincke  Needle gauge: 22 G Needle length: 12.7 cm Assessment Sensory level: T10 Events: CSF return  Additional Notes Negative paresthesia. Negative blood return. + free flowing CSF. Expiration date of spinal kit checked and confirmed. Pt tolerated procedure well.

## 2024-12-23 ENCOUNTER — Encounter: Payer: Self-pay | Admitting: Surgery

## 2024-12-23 NOTE — Anesthesia Postprocedure Evaluation (Signed)
"   Anesthesia Post Note  Patient: Montrey Buist Janvrin  Procedure(s) Performed: ARTHROPLASTY, HIP, TOTAL,POSTERIOR APPROACH (Left: Hip)  Anesthesia Type: Spinal Level of consciousness: oriented and awake and alert Pain management: pain level controlled Vital Signs Assessment: post-procedure vital signs reviewed and stable Respiratory status: spontaneous breathing, respiratory function stable and patient connected to nasal cannula oxygen Cardiovascular status: blood pressure returned to baseline and stable Postop Assessment: no headache, no backache and no apparent nausea or vomiting Anesthetic complications: no Comments: Patient unable to be seen prior to discharge. Per notes patient worked with PT and was ambulating prior to DC.    No notable events documented.   Last Vitals:  Vitals:   12/22/24 1548 12/22/24 1738  BP: 130/79 137/76  Pulse: (!) 55 71  Resp: 16 17  Temp: 36.8 C 36.5 C  SpO2: 96% 95%    Last Pain:  Vitals:   12/22/24 1548  TempSrc: Temporal  PainSc:                  Lendia LITTIE Mae      "

## 2024-12-26 NOTE — Progress Notes (Signed)
 Based on his recent lab work, I would agree that he is a type II diabetic.  Thank you!

## 2025-01-02 ENCOUNTER — Ambulatory Visit: Admitting: Family Medicine

## 2025-01-04 ENCOUNTER — Telehealth: Payer: Self-pay | Admitting: *Deleted

## 2025-01-04 NOTE — Telephone Encounter (Signed)
 RN called and spoke to pharmacy regarding request to refill oxycodone  10 mg.  RN reviewed MD note which stated on 11//10/25 that no follow up needed in clinic and patient had no future appointments with clinic or provider.  Pharmacy stated will sent request to another provider(Dr Poggi)  for refill.

## 2025-01-06 ENCOUNTER — Other Ambulatory Visit: Payer: Self-pay | Admitting: Family Medicine

## 2025-01-08 ENCOUNTER — Other Ambulatory Visit (HOSPITAL_BASED_OUTPATIENT_CLINIC_OR_DEPARTMENT_OTHER): Payer: Self-pay
